# Patient Record
Sex: Female | Born: 1975 | Race: White | Hispanic: No | Marital: Married | Smoking: Former smoker
Health system: Southern US, Community
[De-identification: ages and names within clinical notes are randomized; demographics above are authoritative.]

## PROBLEM LIST (undated history)

## (undated) DIAGNOSIS — N9489 Other specified conditions associated with female genital organs and menstrual cycle: Secondary | ICD-10-CM

## (undated) DIAGNOSIS — K219 Gastro-esophageal reflux disease without esophagitis: Secondary | ICD-10-CM

## (undated) DIAGNOSIS — G43009 Migraine without aura, not intractable, without status migrainosus: Secondary | ICD-10-CM

## (undated) DIAGNOSIS — C50919 Malignant neoplasm of unspecified site of unspecified female breast: Secondary | ICD-10-CM

## (undated) DIAGNOSIS — Z9221 Personal history of antineoplastic chemotherapy: Secondary | ICD-10-CM

## (undated) DIAGNOSIS — N920 Excessive and frequent menstruation with regular cycle: Secondary | ICD-10-CM

## (undated) DIAGNOSIS — N189 Chronic kidney disease, unspecified: Secondary | ICD-10-CM

## (undated) DIAGNOSIS — Z923 Personal history of irradiation: Secondary | ICD-10-CM

## (undated) HISTORY — PX: BREAST BIOPSY: SHX20

## (undated) HISTORY — DX: Malignant neoplasm of unspecified site of unspecified female breast: C50.919

## (undated) SURGERY — MASTECTOMY WITH SENTINEL LYMPH NODE BIOPSY
Anesthesia: General | Site: Breast | Laterality: Bilateral

---

## 2008-11-07 HISTORY — PX: SALPINGECTOMY: SHX328

## 2009-05-07 HISTORY — PX: ECTOPIC PREGNANCY SURGERY: SHX613

## 2011-04-01 ENCOUNTER — Other Ambulatory Visit (HOSPITAL_COMMUNITY)
Admission: RE | Admit: 2011-04-01 | Discharge: 2011-04-01 | Disposition: A | Discharge status: Home / Self Care | Payer: Managed Care, Other (non HMO) | Source: Ambulatory Visit | Attending: Obstetrics and Gynecology | Admitting: Obstetrics and Gynecology

## 2011-04-01 ENCOUNTER — Other Ambulatory Visit: Payer: Self-pay | Admitting: Obstetrics and Gynecology

## 2011-04-01 DIAGNOSIS — N6311 Unspecified lump in the right breast, upper outer quadrant: Secondary | ICD-10-CM

## 2011-04-01 DIAGNOSIS — Z803 Family history of malignant neoplasm of breast: Secondary | ICD-10-CM

## 2011-04-01 DIAGNOSIS — Z01419 Encounter for gynecological examination (general) (routine) without abnormal findings: Secondary | ICD-10-CM | POA: Insufficient documentation

## 2011-08-12 ENCOUNTER — Other Ambulatory Visit: Payer: Self-pay | Admitting: Obstetrics and Gynecology

## 2011-08-12 DIAGNOSIS — N971 Female infertility of tubal origin: Secondary | ICD-10-CM

## 2011-08-22 ENCOUNTER — Ambulatory Visit (HOSPITAL_COMMUNITY)
Admission: RE | Admit: 2011-08-22 | Discharge: 2011-08-22 | Disposition: A | Discharge status: Home / Self Care | Payer: Managed Care, Other (non HMO) | Source: Ambulatory Visit | Attending: Obstetrics and Gynecology | Admitting: Obstetrics and Gynecology

## 2011-08-22 DIAGNOSIS — Z3049 Encounter for surveillance of other contraceptives: Secondary | ICD-10-CM | POA: Insufficient documentation

## 2011-08-22 DIAGNOSIS — N971 Female infertility of tubal origin: Secondary | ICD-10-CM

## 2011-08-22 LAB — PREGNANCY, URINE: Preg Test, Ur: NEGATIVE

## 2011-08-22 MED ORDER — IOHEXOL 300 MG/ML  SOLN: 5.0000 mL | Freq: Once | INTRAMUSCULAR | Status: AC | PRN

## 2014-03-03 ENCOUNTER — Other Ambulatory Visit (HOSPITAL_COMMUNITY)
Admission: RE | Admit: 2014-03-03 | Discharge: 2014-03-03 | Disposition: A | Discharge status: Home / Self Care | Payer: Managed Care, Other (non HMO) | Source: Ambulatory Visit | Attending: Obstetrics and Gynecology | Admitting: Obstetrics and Gynecology

## 2014-03-03 ENCOUNTER — Other Ambulatory Visit: Payer: Self-pay | Admitting: Obstetrics and Gynecology

## 2014-03-03 DIAGNOSIS — Z1151 Encounter for screening for human papillomavirus (HPV): Secondary | ICD-10-CM | POA: Insufficient documentation

## 2014-03-03 DIAGNOSIS — Z01419 Encounter for gynecological examination (general) (routine) without abnormal findings: Secondary | ICD-10-CM | POA: Insufficient documentation

## 2014-03-17 ENCOUNTER — Other Ambulatory Visit: Payer: Self-pay | Admitting: Radiology

## 2014-08-07 DIAGNOSIS — Z170 Estrogen receptor positive status [ER+]: Secondary | ICD-10-CM

## 2014-08-07 HISTORY — DX: Estrogen receptor positive status (ER+): Z17.0

## 2014-08-20 ENCOUNTER — Other Ambulatory Visit: Payer: Self-pay | Admitting: Radiology

## 2014-08-21 ENCOUNTER — Other Ambulatory Visit: Payer: Self-pay | Admitting: Radiology

## 2014-08-21 DIAGNOSIS — C50912 Malignant neoplasm of unspecified site of left female breast: Secondary | ICD-10-CM

## 2014-08-22 ENCOUNTER — Telehealth: Payer: Self-pay | Admitting: *Deleted

## 2014-08-22 DIAGNOSIS — C50412 Malignant neoplasm of upper-outer quadrant of left female breast: Secondary | ICD-10-CM | POA: Insufficient documentation

## 2014-08-22 NOTE — Telephone Encounter (Signed)
Confirmed [facility] for 08/27/14 at 0830.  Instructions and contact information given.

## 2014-08-25 ENCOUNTER — Ambulatory Visit: Payer: Managed Care, Other (non HMO) | Admitting: Psychology

## 2014-08-26 ENCOUNTER — Ambulatory Visit
Admission: RE | Admit: 2014-08-26 | Discharge: 2014-08-26 | Disposition: A | Discharge status: Home / Self Care | Payer: Managed Care, Other (non HMO) | Source: Ambulatory Visit | Attending: Radiology | Admitting: Radiology

## 2014-08-26 DIAGNOSIS — C50912 Malignant neoplasm of unspecified site of left female breast: Secondary | ICD-10-CM

## 2014-08-26 MED ORDER — GADOBENATE DIMEGLUMINE 529 MG/ML IV SOLN
12.0000 mL | Freq: Once | INTRAVENOUS | Status: AC | PRN
  Administered 2014-08-26: 12 mL via INTRAVENOUS

## 2014-08-27 ENCOUNTER — Encounter: Payer: Self-pay | Admitting: *Deleted

## 2014-08-27 ENCOUNTER — Encounter: Payer: Self-pay | Admitting: Hematology and Oncology

## 2014-08-27 ENCOUNTER — Other Ambulatory Visit: Payer: Managed Care, Other (non HMO)

## 2014-08-27 ENCOUNTER — Encounter: Payer: Self-pay | Admitting: Dietician

## 2014-08-27 ENCOUNTER — Ambulatory Visit (HOSPITAL_BASED_OUTPATIENT_CLINIC_OR_DEPARTMENT_OTHER): Payer: Managed Care, Other (non HMO) | Admitting: Hematology and Oncology

## 2014-08-27 ENCOUNTER — Telehealth: Payer: Self-pay | Admitting: Hematology and Oncology

## 2014-08-27 ENCOUNTER — Ambulatory Visit: Payer: Managed Care, Other (non HMO) | Attending: Surgery | Admitting: Physical Therapy

## 2014-08-27 ENCOUNTER — Ambulatory Visit
Admission: RE | Admit: 2014-08-27 | Discharge: 2014-08-27 | Disposition: A | Discharge status: Home / Self Care | Payer: Managed Care, Other (non HMO) | Source: Ambulatory Visit | Attending: Radiation Oncology | Admitting: Radiation Oncology

## 2014-08-27 ENCOUNTER — Ambulatory Visit: Payer: Managed Care, Other (non HMO) | Admitting: Psychology

## 2014-08-27 ENCOUNTER — Other Ambulatory Visit: Payer: Self-pay | Admitting: Radiology

## 2014-08-27 ENCOUNTER — Other Ambulatory Visit: Payer: Self-pay | Admitting: *Deleted

## 2014-08-27 ENCOUNTER — Ambulatory Visit: Payer: Managed Care, Other (non HMO)

## 2014-08-27 VITALS — BP 126/90 | HR 64 | Temp 97.8°F | Resp 18 | Ht 67.0 in | Wt 139.2 lb

## 2014-08-27 DIAGNOSIS — Z90721 Acquired absence of ovaries, unilateral: Secondary | ICD-10-CM | POA: Insufficient documentation

## 2014-08-27 DIAGNOSIS — C50919 Malignant neoplasm of unspecified site of unspecified female breast: Secondary | ICD-10-CM | POA: Diagnosis not present

## 2014-08-27 DIAGNOSIS — C50412 Malignant neoplasm of upper-outer quadrant of left female breast: Secondary | ICD-10-CM

## 2014-08-27 DIAGNOSIS — Z803 Family history of malignant neoplasm of breast: Secondary | ICD-10-CM

## 2014-08-27 DIAGNOSIS — Z5189 Encounter for other specified aftercare: Secondary | ICD-10-CM | POA: Insufficient documentation

## 2014-08-27 DIAGNOSIS — Z8049 Family history of malignant neoplasm of other genital organs: Secondary | ICD-10-CM

## 2014-08-27 DIAGNOSIS — Z170 Estrogen receptor positive status [ER+]: Secondary | ICD-10-CM

## 2014-08-27 NOTE — Progress Notes (Signed)
Patient was seen by RD during [facility] on 08/27/14.  Provided pt with folder of educational materials regarding general nutrition recommendations for breast cancer patients, plant-based diets, antioxidants, cancer facts vs myths, and information on organic foods  Explained importance of healthy nutrition during treatments and encouraged pt to consume daily recommended amount of fruits and vegetables, emphasizing variety of intake for maximum antioxidant and synergistic health benefits. Promoted adequate fiber intake, with use of whole grain and whole wheat products, beans, and lentils. Encouraged patient to follow a low fat diet with use of heart healthy fats, and to opt for plant-based proteins weekly  Recommended pt maintain healthy weight during treatments.  Diet recall indicated patient began following a paleo/whole foods diet about 1 year ago to become more healthy.  Eats increased amounts of fruits and vegetables.  Lost 15-20 lbs one year ago with diet changes.  Expect very good compliance  Provided pt with outpatient oncology RD contact information. Encouraged pt to contact RD with additional follow up questions or nutrition-related concerns.  Antonieta Iba, RD, LDN Clinical Inpatient Dietitian Pager:  [phone] Weekend and after hours pager:  [phone]

## 2014-08-27 NOTE — Progress Notes (Signed)
  Radiation Oncology         [phone]) [phone] ________________________________  Initial outpatient Consultation - Date: 08/27/2014   Name: Kim Armstrong MRN: [id number]   DOB: 1975-11-23  REFERRING PHYSICIAN: Erroll Luna, MD  DIAGNOSIS:    ICD-9-CM ICD-10-CM  1. Breast cancer of upper-outer quadrant of left female breast 174.4 C50.412    STAGE: Breast cancer of upper-outer quadrant of left female breast   Primary site: Breast (Left)   Staging method: AJCC 7th Edition   Clinical: Stage IIA (T2, N0, cM0)   Summary: Stage IIA (T2, N0, cM0)   Clinical comments: Staged at breast conference on 10.21.15   HISTORY OF PRESENT ILLNESS::Kim Armstrong is a 38 y.o. female  Who presented with a palpable left breast mass. 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 MRI of 1.9 x 2.5 x 2.6 cm mass in the left breast and a 8 mm nodule close to the mass (1 cm posterior to mass). She had a biopsy which whosed a Grade 3 IDC with ER+PR+HER2+ with a Ki 67 of 25% She is GxP1 with menarche at 41 and is still having periods. She is accompanied by her husband. Her mother had cancer at 34.   PREVIOUS RADIATION THERAPY: No  FAMILY HISTORY:  Family History  Problem Relation Age of Onset  . Breast cancer Mother     SOCIAL HISTORY:  History  Substance Use Topics  . Smoking status: Former Research scientist (life sciences)  . Smokeless tobacco: Not on file  . Alcohol Use: Yes    REVIEW OF SYSTEMS:  A 15 point review of systems is documented in the electronic medical record. This was obtained by the nursing staff. However, I reviewed this with the patient to discuss relevant findings and make appropriate changes.  Pertinent positives are included in the chart.   PHYSICAL EXAM:  Pleasant female in no distress. 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 Palpable mass in the retroareolar portion of the left breast with some bruising. No palpable adenopathy. Strength is 5.5  bilaterally. She is alert and oriented x 3.   IMPRESSION: Stage II Left Breast Cancer  PLAN: We discussed the role of radiation and decreasing local failures in patients who undergo lumpectomy. We discussed the retrospective data showing an increase in failure rates in patients who have a pathologic complete response and did not undergo radiation. For this reason I have recommended radiation to the whole breast followed by boost to the tumor bed. We discussed the process of simulation the placement tattoos. We discussed possible side effects during treatment including but not limited to skin irritation darkness and fatigue. We discussed long-term effects of treatment which are extremely unlikely but possible including damage to the lungs and ribs. We discussed the low likelihood of secondary malignancies. We discussed the use of breath hold technique for cardiac sparing.   She will receive neoadjuvant chemotherapy right now and we have scheduled her for genetic counseling due to her age and family history.   She has met with physical therapy and social work as well as our Health visitor.    I spent 60 minutes  face to face with the patient and more than 50% of that time was spent in counseling and/or coordination of care.   ------------------------------------------------  Thea Silversmith, MD

## 2014-08-27 NOTE — Assessment & Plan Note (Signed)
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 patient scheduled to undergo biopsy of a satellite lesion  Pathology counseling:Discussed with the patient, the details of pathology including the type of breast cancer,the clinical staging, the significance of ER, PR and HER-2/neu receptors and the implications for treatment. After reviewing the pathology in detail, we proceeded to discuss the different treatment options between surgery, radiation, chemotherapy, antiestrogen therapies.  Recommendation: Based on the multidisciplinary breast cancer conference, recommend neoadjuvant chemotherapy with Taxotere, carboplatin, Herceptin, Perjeta given once every 3 weeks for 6 cycles followed by Herceptin maintenance for one full year. After she undergoes surgery, if she gets a lumpectomy then she will need radiation therapy. 5 radiation and continue with Herceptin along with that we will initiate antiestrogen therapy with tamoxifen once daily for 10 years   Chemotherapy counseling: Discussed the risks and benefits of chemotherapy including the risk of hair loss, nausea vomiting, neuropathy, cytopenias, loss of taste and fatigue, risk of infection, as well as cardiac risk factors related to Herceptin and Perjeta. Patient will get Neulasta on day 2 of chemotherapy to prevent any infections. All of her questions regarding chemotherapy were answered. Patient understands it cannot be given either preoperatively or postoperatively. If she receives a preoperatively we may be a to render her pathologic complete response which could translate into significantly superior survival.  Plan: Patient will need to undergo port placement, echocardiogram, chemotherapy education class as well as genetics consultation. Our plan is to start chemotherapy within the next one to 2 weeks.

## 2014-08-27 NOTE — Progress Notes (Signed)
[facility] NOTE  Patient Care Team: Thurnell Lose, MD as PCP - General (Obstetrics and Gynecology) Thurnell Lose, MD (Obstetrics and Gynecology) Erroll Luna, MD as Consulting Physician (General Surgery) Rulon Eisenmenger, MD as Consulting Physician (Hematology and Oncology) Thea Silversmith, MD as Consulting Physician (Radiation Oncology) Erroll Luna, MD as Consulting Physician (General Surgery) Rulon Eisenmenger, MD as Consulting Physician (Hematology and Oncology) Thea Silversmith, MD as Consulting Physician (Radiation Oncology)  CHIEF COMPLAINTS/PURPOSE OF CONSULTATION:  Newly diagnosed breast cancer  HISTORY OF PRESENTING ILLNESS:  Kim Armstrong 38 y.o. female is here because of recent diagnosis of left breast cancer. She has a prior history of fibroadenoma involving the right breast and she is always had cystic changes in the breasts. Her breast always felt nodular and also tender to deep palpation. She underwent a mammogram that revealed abnormalities which led to a biopsy on 08/20/2014 that revealed that she had an invasive ductal carcinoma grade 3 that was ER/PR and HER-2 positive with a Ki-67 of 25%. She underwent an MRI of the breast which revealed a satellite nodule that is scheduled to be biopsied soon. In the same breast there was another mass was biopsy-proven to be fibroadenoma. On the left breast she has multiple very large cysts.  I reviewed her records extensively and collaborated the history with the patient.  SUMMARY OF ONCOLOGIC HISTORY:   Breast cancer of upper-outer quadrant of left female breast   08/20/2014 Initial Diagnosis Invasive ductal carcinoma grade 3, ER PR positive HER-2 positive ratio 3.5, Ki-67 25%   08/26/2014 Breast MRI Left breast: Upper-outer quadrant 1.4 cm from nipple 1.9 x 2.5 x 2.6 cm enhancing mass with the 8 mm nodule 1 cm posterior Right breast: 1.4 x 1.6 cm mass upper outer quadrant biopsy proven fibroadenoma:    In  terms of breast cancer risk profile:  She menarched at early age of 40 and still having periods She had one pregnancy, her first child was born at age 40 She was never exposed to fertility medications or hormone replacement therapy.  She has  family history of Breast/GYN/GI cancer Her mother was diagnosed with breast cancer age 47 MEDICAL HISTORY:  Past Medical History  Diagnosis Date  . Breast cancer     SURGICAL HISTORY: Past Surgical History  Procedure Laterality Date  . Salpingectomy  2010    SOCIAL HISTORY: History   Social History  . Marital Status: Married    Spouse Name: N/A    Number of Children: N/A  . Years of Education: N/A   Occupational History  . Not on file.   Social History Main Topics  . Smoking status: Former Research scientist (life sciences)  . Smokeless tobacco: Not on file  . Alcohol Use: Yes  . Drug Use: No  . Sexual Activity: Not on file   Other Topics Concern  . Not on file   Social History Narrative  . No narrative on file    FAMILY HISTORY: Family History  Problem Relation Age of Onset  . Breast cancer Mother     ALLERGIES:  has no allergies on file.  MEDICATIONS:  Current Outpatient Prescriptions  Medication Sig Dispense Refill  . ALPRAZolam (XANAX) 0.25 MG tablet Take 0.25 mg by mouth at bedtime as needed for anxiety.      Marland Kitchen isometheptene-acetaminophen-dichloralphenazone (MIDRIN) 65-325-100 MG capsule Take by mouth 4 (four) times daily as needed for migraine. Maximum 5 capsules in 12 hours for migraine headaches, 8 capsules in 24 hours for tension  headaches.      . metroNIDAZOLE (FLAGYL) 500 MG tablet Take 500 mg by mouth.      . tranexamic acid (LYSTEDA) 650 MG TABS tablet Take 1,300 mg by mouth 3 (three) times daily.       No current facility-administered medications for this visit.    REVIEW OF SYSTEMS:   Constitutional: Denies fevers, chills or abnormal night sweats Eyes: Denies blurriness of vision, double vision or watery eyes Ears, nose,  mouth, throat, and face: Denies mucositis or sore throat Respiratory: Denies cough, dyspnea or wheezes Cardiovascular: Denies palpitation, chest discomfort or lower extremity swelling Gastrointestinal:  Denies nausea, heartburn or change in bowel habits Skin: Denies abnormal skin rashes Lymphatics: Denies new lymphadenopathy or easy bruising Neurological:Denies numbness, tingling or new weaknesses Behavioral/Psych: Mood is stable, no new changes  Breast: Palpable mass in both breasts All other systems were reviewed with the patient and are negative.  PHYSICAL EXAMINATION: ECOG PERFORMANCE STATUS: 0 - Asymptomatic  Filed Vitals:   08/27/14 0901  BP: 126/90  Pulse: 64  Temp: 97.8 F (36.6 C)  Resp: 18   Filed Weights   08/27/14 0901  Weight: 139 lb 3.2 oz (63.141 kg)    GENERAL:alert, no distress and comfortable SKIN: skin color, texture, turgor are normal, no rashes or significant lesions EYES: normal, conjunctiva are pink and non-injected, sclera clear OROPHARYNX:no exudate, no erythema and lips, buccal mucosa, and tongue normal  NECK: supple, thyroid normal size, non-tender, without nodularity LYMPH:  no palpable lymphadenopathy in the cervical, axillary or inguinal LUNGS: clear to auscultation and percussion with normal breathing effort HEART: regular rate & rhythm and no murmurs and no lower extremity edema ABDOMEN:abdomen soft, non-tender and normal bowel sounds Musculoskeletal:no cyanosis of digits and no clubbing  PSYCH: alert & oriented x 3 with fluent speech NEURO: no focal motor/sensory deficits BREAST: Large palpable mass in both breasts many of which are cystic findings noted on the ultrasound. No palpable axillary or supraclavicular lymphadenopathy  LABORATORY DATA:  I have reviewed the data as listed No results found for this basename: WBC,  HGB,  HCT,  MCV,  PLT   No results found for this basename: NA,  K,  CL,  CO2    RADIOGRAPHIC STUDIES: I have  personally reviewed the radiological reports and agreed with the findings in the report.  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 patient scheduled to undergo biopsy of a satellite lesion  Pathology counseling:Discussed with the patient, the details of pathology including the type of breast cancer,the clinical staging, the significance of ER, PR and HER-2/neu receptors and the implications for treatment. After reviewing the pathology in detail, we proceeded to discuss the different treatment options between surgery, radiation, chemotherapy, antiestrogen therapies.  Recommendation: Based on the multidisciplinary breast cancer conference, recommend neoadjuvant chemotherapy with Taxotere, carboplatin, Herceptin, Perjeta given once every 3 weeks for 6 cycles followed by Herceptin maintenance for one full year. After she undergoes surgery, if she gets a lumpectomy then she will need radiation therapy. 5 radiation and continue with Herceptin along with that we will initiate antiestrogen therapy with tamoxifen once daily for 10 years   Chemotherapy counseling: Discussed the risks and benefits of chemotherapy including the risk of hair loss, nausea vomiting, neuropathy, cytopenias, loss of taste and fatigue, risk of infection, as well as cardiac risk factors  related to Herceptin and Perjeta. Patient will get Neulasta on day 2 of chemotherapy to prevent any infections. All of her questions regarding chemotherapy were answered. Patient understands it cannot be given either preoperatively or postoperatively. If she receives a preoperatively we may be a to render her pathologic complete response which could translate into significantly superior survival.  Plan: Patient will need to undergo port placement, echocardiogram, chemotherapy education class as  well as genetics consultation. Our plan is to start chemotherapy within the next one to 2 weeks.   All questions were answered. The patient knows to call the clinic with any problems, questions or concerns. I spent 40 minutes counseling the patient face to face. The total time spent in the appointment was 60 minutes and more than 50% was on counseling.     Rulon Eisenmenger, MD 08/27/2014 12:17 PM

## 2014-08-27 NOTE — Progress Notes (Signed)
Kim Armstrong Psychosocial Distress Screening  Clinical Social Work   Patient completed distress screening protocol and scored a 6 on the Psychosocial Distress Thermometer which indicates moderate distress. Clinical Social Worker met with patient in [facility] to assess for distress and other psychosocial needs. Patient stated her level of distress was slightly lower after meeting with the medical team and getting more information on her treatment plan. CSW and patient discussed the importance of support during treatment and common emotional reactions to being diagnosed with cancer. CSW informed patient of the support team and support services at [facility]. Patient expressed interest in counseling and was agreeable to a referral to the [facility] counseling interns. CSW offered additional support and encouraged patient to call with questions or concerns.   ONCBCN DISTRESS SCREENING 08/27/2014  Screening Type Initial Screening  Elta Gu[place] the number that describes how much distress you have been experiencing in the past week 6  Emotional problem type Adjusting to illness;Adjusting to appearance changes  Information Concerns Type Lack of info about diagnosis;Lack of info about treatment  Physician notified of physical symptoms Yes  Referral to clinical social work Yes  Referral to support programs Yes   Johnnye Lana, MSW, LCSW, OSW-C Clinical Social Worker [place] [phone]

## 2014-08-27 NOTE — Progress Notes (Signed)
Checked in new pt with no financial concerns at this time.  Informed pt if chemo is part of her treatment plan I will call [employer] to see if Kim Armstrong is req and will obtain that if it is as well as get in touch with different foundations that offer copay assistance for chemo if needed. Pt has my card for any questions or concerns.

## 2014-08-28 ENCOUNTER — Other Ambulatory Visit (HOSPITAL_BASED_OUTPATIENT_CLINIC_OR_DEPARTMENT_OTHER): Payer: Managed Care, Other (non HMO)

## 2014-08-28 ENCOUNTER — Encounter: Payer: Self-pay | Admitting: *Deleted

## 2014-08-28 ENCOUNTER — Ambulatory Visit (HOSPITAL_BASED_OUTPATIENT_CLINIC_OR_DEPARTMENT_OTHER): Payer: Managed Care, Other (non HMO) | Admitting: Genetic Counselor

## 2014-08-28 ENCOUNTER — Encounter: Payer: Self-pay | Admitting: Genetic Counselor

## 2014-08-28 DIAGNOSIS — C50412 Malignant neoplasm of upper-outer quadrant of left female breast: Secondary | ICD-10-CM

## 2014-08-28 DIAGNOSIS — Z315 Encounter for genetic counseling: Secondary | ICD-10-CM

## 2014-08-28 DIAGNOSIS — Z803 Family history of malignant neoplasm of breast: Secondary | ICD-10-CM

## 2014-08-28 LAB — COMPREHENSIVE METABOLIC PANEL (CC13)
ALT: 14 U/L (ref 0–55)
AST: 14 U/L (ref 5–34)
Albumin: 4.2 g/dL (ref 3.5–5.0)
Alkaline Phosphatase: 41 U/L (ref 40–150)
Anion Gap: 7 mEq/L (ref 3–11)
BUN: 9.3 mg/dL (ref 7.0–26.0)
CO2: 25 mEq/L (ref 22–29)
Calcium: 9.6 mg/dL (ref 8.4–10.4)
Chloride: 107 mEq/L (ref 98–109)
Creatinine: 0.9 mg/dL (ref 0.6–1.1)
Glucose: 91 mg/dl (ref 70–140)
Potassium: 4.1 mEq/L (ref 3.5–5.1)
Sodium: 139 mEq/L (ref 136–145)
Total Bilirubin: 0.29 mg/dL (ref 0.20–1.20)
Total Protein: 7 g/dL (ref 6.4–8.3)

## 2014-08-28 LAB — CBC WITH DIFFERENTIAL/PLATELET
BASO%: 0.6 % (ref 0.0–2.0)
Basophils Absolute: 0 10*3/uL (ref 0.0–0.1)
EOS%: 1.8 % (ref 0.0–7.0)
Eosinophils Absolute: 0.1 10*3/uL (ref 0.0–0.5)
HCT: 39.9 % (ref 34.8–46.6)
HGB: 13.1 g/dL (ref 11.6–15.9)
LYMPH%: 25.6 % (ref 14.0–49.7)
MCH: 30.9 pg (ref 25.1–34.0)
MCHC: 32.9 g/dL (ref 31.5–36.0)
MCV: 93.8 fL (ref 79.5–101.0)
MONO#: 0.5 10*3/uL (ref 0.1–0.9)
MONO%: 9.5 % (ref 0.0–14.0)
NEUT#: 3 10*3/uL (ref 1.5–6.5)
NEUT%: 62.5 % (ref 38.4–76.8)
Platelets: 242 10*3/uL (ref 145–400)
RBC: 4.25 10*6/uL (ref 3.70–5.45)
RDW: 12.6 % (ref 11.2–14.5)
WBC: 4.8 10*3/uL (ref 3.9–10.3)
lymph#: 1.2 10*3/uL (ref 0.9–3.3)

## 2014-08-28 NOTE — Progress Notes (Signed)
Met with pt after genetics appt. Gave appts for echo and chemo class. Discussed location of each appt. Pt denies further needs at this time. Gave pt contact information.

## 2014-08-28 NOTE — Progress Notes (Signed)
HISTORY OF PRESENT ILLNESS: Dr. Simona Huh requested a cancer genetics consultation for Kim Armstrong, a 38 y.o. female, due to a personal and family history of cancer.  Kim Armstrong presents to clinic today to discuss the possibility of a hereditary predisposition to cancer, genetic testing, and to further clarify her future cancer risks, as well as potential cancer risk for family members. Kim Armstrong was diagnosed with left breast IDC (triple positive)  at the age of 63. She is planning to undergo neoadjuvant therapy followed by surgery. The type of surgery and need for radiation therapy is to be determined.  She has no history of other cancer. Both ovaries remain intact.   Past Medical History  Diagnosis Date   Breast cancer     Past Surgical History  Procedure Laterality Date   Salpingectomy  2010   History   Social History   Marital Status: Married    Spouse Name: N/A    Number of Children: N/A   Years of Education: N/A   Social History Main Topics   Smoking status: Former Smoker   Smokeless tobacco: Not on file   Alcohol Use: Yes   Drug Use: No   Sexual Activity: Not on file   Other Topics Concern   Not on file   Social History Narrative   No narrative on file     FAMILY HISTORY:  During the visit, a 4-generation pedigree was obtained. Significant diagnoses include the following:  Family History  Problem Relation Age of Onset   Breast cancer Mother 98    unilateral   Cancer Maternal Aunt 37    unknown type of cancer   Cancer Maternal Grandmother 65    cancer - possibly pancreatic cancer or GI cancer    Kim Armstrong's ancestry is of [place] and [place] descent. There is no known Jewish ancestry or consanguinity.  GENETIC COUNSELING ASSESSMENT: Kim Armstrong is a 38 y.o. female with a personal and family history of cancer suggestive of a hereditary predisposition to cancer. We, therefore, discussed and recommended the following at today's  visit.   DISCUSSION: We reviewed the characteristics, features and inheritance patterns of hereditary cancer syndromes. We also discussed genetic testing, including the appropriate family members to test, the process of testing, insurance coverage and turn-around-time for results. We discussed the implications of a negative, positive and/or variant of uncertain significant result. We recommended Kim Armstrong pursue genetic testing for the [employer] gene panel.   PLAN: Based on our above recommendation, Kim Armstrong wished to pursue genetic testing and the blood sample was drawn and will be sent to [facility] for analysis. Results should be available within approximately 5 weeks time, at which point they will be disclosed by telephone to Kim Armstrong, as will any additional recommendations warranted by these results. We also encouraged Kim Armstrong to remain in contact with cancer genetics annually so that we can continuously update the family history and inform her of any changes in cancer genetics and testing that may be of benefit for this family. Ms.  Armstrong questions were answered to her satisfaction today. Our contact information was provided should additional questions or concerns arise.   Thank you for the referral and allowing [place] to share in the care of your patient.   The patient was seen for a total of 35 minutes in face-to-face genetic counseling.  This patient was discussed with Dr. Jana Hakim who agrees with the above.    _______________________________________________________________________ For Office Staff:  Number of people  involved in session: 2 Was an Intern/ student involved with case: not applicable

## 2014-08-29 ENCOUNTER — Ambulatory Visit (INDEPENDENT_AMBULATORY_CARE_PROVIDER_SITE_OTHER): Payer: Self-pay | Admitting: Surgery

## 2014-08-29 ENCOUNTER — Telehealth: Payer: Self-pay | Admitting: *Deleted

## 2014-08-29 NOTE — Progress Notes (Signed)
MD note created during office visit sent to scan.  Copy to patient.   

## 2014-08-29 NOTE — Telephone Encounter (Signed)
Called pt to clarify her needs for scheduling. Pt is scheduled for 2nd opinion with [facility] opinion 09/03/14. She will notify me on the team she chooses. If she chooses [facility], Dr. Josetta Huddle office will schedule port placement. Pt will wait for plastic referral until after she has genetic testing results. Denies further needs. Gave pt contact information.

## 2014-09-01 ENCOUNTER — Other Ambulatory Visit: Payer: Managed Care, Other (non HMO)

## 2014-09-01 ENCOUNTER — Ambulatory Visit (HOSPITAL_COMMUNITY)
Admission: RE | Admit: 2014-09-01 | Discharge: 2014-09-01 | Disposition: A | Discharge status: Home / Self Care | Payer: Managed Care, Other (non HMO) | Source: Ambulatory Visit | Attending: Hematology and Oncology | Admitting: Hematology and Oncology

## 2014-09-01 DIAGNOSIS — C50919 Malignant neoplasm of unspecified site of unspecified female breast: Secondary | ICD-10-CM

## 2014-09-01 DIAGNOSIS — C50412 Malignant neoplasm of upper-outer quadrant of left female breast: Secondary | ICD-10-CM

## 2014-09-01 NOTE — Progress Notes (Signed)
  Echocardiogram 2D Echocardiogram has been performed.  Kim Armstrong 09/01/2014, 10:05 AM

## 2014-09-03 ENCOUNTER — Encounter: Payer: Self-pay | Admitting: General Practice

## 2014-09-03 NOTE — Progress Notes (Signed)
Visited with Kim Armstrong in chemo class.  She used our personal visit time to share and process her family history of breast cancer. (Mom diagnosed at a similar age, but with more advanced case, and is a survivor.  Pt also pointed out that, at the time of her mother's dx, [name] herself was at an age similar to her own daughter's age now.  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  She is aware of ongoing chaplain availability, but please also page as needs arise:  [phone].  Thank you.  [place], [place]

## 2014-09-05 ENCOUNTER — Telehealth: Payer: Self-pay | Admitting: *Deleted

## 2014-09-05 NOTE — Telephone Encounter (Signed)
Left message for a return phone call to follow up.  Awaiting patient response.

## 2014-09-08 ENCOUNTER — Other Ambulatory Visit: Payer: Self-pay | Admitting: Hematology and Oncology

## 2014-09-08 ENCOUNTER — Encounter: Payer: Self-pay | Admitting: *Deleted

## 2014-09-08 ENCOUNTER — Telehealth: Payer: Self-pay | Admitting: Hematology and Oncology

## 2014-09-08 ENCOUNTER — Other Ambulatory Visit (INDEPENDENT_AMBULATORY_CARE_PROVIDER_SITE_OTHER): Payer: Self-pay

## 2014-09-08 ENCOUNTER — Telehealth: Payer: Self-pay | Admitting: *Deleted

## 2014-09-08 DIAGNOSIS — C50912 Malignant neoplasm of unspecified site of left female breast: Secondary | ICD-10-CM

## 2014-09-08 NOTE — Telephone Encounter (Signed)
LM to confirm Chemo edu class r/s to 09/17/14.

## 2014-09-08 NOTE — Progress Notes (Signed)
Pt called to state she has decided to receive treatment at [facility]. Physician team notified.

## 2014-09-08 NOTE — Telephone Encounter (Signed)
Per staff message from navigator I have scheduled apptt.. JMW

## 2014-09-10 ENCOUNTER — Other Ambulatory Visit: Payer: Self-pay | Admitting: Hematology and Oncology

## 2014-09-10 DIAGNOSIS — C50412 Malignant neoplasm of upper-outer quadrant of left female breast: Secondary | ICD-10-CM

## 2014-09-10 MED ORDER — LIDOCAINE-PRILOCAINE 2.5-2.5 % EX CREA: TOPICAL_CREAM | CUTANEOUS | Status: DC

## 2014-09-10 MED ORDER — LORAZEPAM 0.5 MG PO TABS: 0.5000 mg | ORAL_TABLET | Freq: Four times a day (QID) | ORAL | Status: DC | PRN

## 2014-09-10 MED ORDER — PROCHLORPERAZINE MALEATE 10 MG PO TABS: 10.0000 mg | ORAL_TABLET | Freq: Four times a day (QID) | ORAL | Status: DC | PRN

## 2014-09-10 MED ORDER — ONDANSETRON HCL 8 MG PO TABS: 8.0000 mg | ORAL_TABLET | Freq: Two times a day (BID) | ORAL | Status: DC

## 2014-09-10 MED ORDER — DEXAMETHASONE 4 MG PO TABS: 8.0000 mg | ORAL_TABLET | Freq: Two times a day (BID) | ORAL | Status: DC

## 2014-09-11 ENCOUNTER — Other Ambulatory Visit: Payer: Self-pay | Admitting: Radiology

## 2014-09-11 ENCOUNTER — Telehealth: Payer: Self-pay

## 2014-09-11 NOTE — Telephone Encounter (Signed)
mamogram results rcvd from [facility] 08/27/14 Dr Isaiah Blakes.  Copy to Dr Lindi Adie.  Original to scan.

## 2014-09-12 ENCOUNTER — Ambulatory Visit (HOSPITAL_COMMUNITY)
Admission: RE | Admit: 2014-09-12 | Discharge: 2014-09-12 | Disposition: A | Discharge status: Home / Self Care | Payer: Managed Care, Other (non HMO) | Source: Ambulatory Visit | Attending: Surgery | Admitting: Surgery

## 2014-09-12 ENCOUNTER — Other Ambulatory Visit (INDEPENDENT_AMBULATORY_CARE_PROVIDER_SITE_OTHER): Payer: Self-pay | Admitting: Surgery

## 2014-09-12 ENCOUNTER — Encounter (HOSPITAL_COMMUNITY): Payer: Self-pay

## 2014-09-12 DIAGNOSIS — Z87891 Personal history of nicotine dependence: Secondary | ICD-10-CM | POA: Insufficient documentation

## 2014-09-12 DIAGNOSIS — C50912 Malignant neoplasm of unspecified site of left female breast: Secondary | ICD-10-CM | POA: Diagnosis present

## 2014-09-12 DIAGNOSIS — Z7952 Long term (current) use of systemic steroids: Secondary | ICD-10-CM | POA: Diagnosis not present

## 2014-09-12 LAB — CBC WITH DIFFERENTIAL/PLATELET
Basophils Absolute: 0 10*3/uL (ref 0.0–0.1)
Basophils Relative: 0 % (ref 0–1)
Eosinophils Absolute: 0.1 10*3/uL (ref 0.0–0.7)
Eosinophils Relative: 4 % (ref 0–5)
HCT: 39.3 % (ref 36.0–46.0)
Hemoglobin: 13.3 g/dL (ref 12.0–15.0)
Lymphocytes Relative: 31 % (ref 12–46)
Lymphs Abs: 1 10*3/uL (ref 0.7–4.0)
MCH: 30.9 pg (ref 26.0–34.0)
MCHC: 33.8 g/dL (ref 30.0–36.0)
MCV: 91.4 fL (ref 78.0–100.0)
Monocytes Absolute: 0.3 10*3/uL (ref 0.1–1.0)
Monocytes Relative: 10 % (ref 3–12)
Neutro Abs: 1.9 10*3/uL (ref 1.7–7.7)
Neutrophils Relative %: 55 % (ref 43–77)
Platelets: 215 10*3/uL (ref 150–400)
RBC: 4.3 MIL/uL (ref 3.87–5.11)
RDW: 12.3 % (ref 11.5–15.5)
WBC: 3.4 10*3/uL — ABNORMAL LOW (ref 4.0–10.5)

## 2014-09-12 LAB — PROTIME-INR
INR: 0.97 (ref 0.00–1.49)
Prothrombin Time: 13 seconds (ref 11.6–15.2)

## 2014-09-12 LAB — HCG, SERUM, QUALITATIVE: Preg, Serum: NEGATIVE

## 2014-09-12 LAB — APTT: aPTT: 29 seconds (ref 24–37)

## 2014-09-12 MED ORDER — FENTANYL CITRATE 0.05 MG/ML IJ SOLN
INTRAMUSCULAR | Status: AC
Start: 2014-09-12 — End: 2014-09-13
  Filled 2014-09-12: qty 2

## 2014-09-12 MED ORDER — HEPARIN SOD (PORK) LOCK FLUSH 100 UNIT/ML IV SOLN
INTRAVENOUS | Status: AC
Start: 2014-09-12 — End: 2014-09-13
  Filled 2014-09-12: qty 5

## 2014-09-12 MED ORDER — SODIUM CHLORIDE 0.9 % IV SOLN
INTRAVENOUS | Status: DC
Start: 2014-09-12 — End: 2014-09-13
  Administered 2014-09-12: 11:00:00 via INTRAVENOUS

## 2014-09-12 MED ORDER — MIDAZOLAM HCL 2 MG/2ML IJ SOLN
INTRAMUSCULAR | Status: AC
Start: 2014-09-12 — End: 2014-09-13
  Filled 2014-09-12: qty 2

## 2014-09-12 MED ORDER — CEFAZOLIN SODIUM-DEXTROSE 2-3 GM-% IV SOLR
INTRAVENOUS | Status: AC
Start: 2014-09-12 — End: 2014-09-13
  Filled 2014-09-12: qty 50

## 2014-09-12 MED ORDER — SODIUM CHLORIDE 0.9 % IR SOLN
Status: AC | PRN
  Administered 2014-09-12: 14:00:00

## 2014-09-12 MED ORDER — LIDOCAINE HCL 1 % IJ SOLN
INTRAMUSCULAR | Status: AC
Start: 2014-09-12 — End: 2014-09-13
  Filled 2014-09-12: qty 20

## 2014-09-12 MED ORDER — CEFAZOLIN SODIUM-DEXTROSE 2-3 GM-% IV SOLR
2.0000 g | INTRAVENOUS | Status: AC
  Administered 2014-09-12: 2 g via INTRAVENOUS

## 2014-09-12 NOTE — H&P (Signed)
Chief Complaint: "I'm getting a port a cath"  Referring Physician(s): Cornett,Thomas/ Dr. Lindi Adie  History of Present Illness: Kim Armstrong is a 38 y.o. 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 She presents today for port a cath placement for chemotherapy.   Past Medical History  Diagnosis Date  . Breast cancer     Past Surgical History  Procedure Laterality Date  . Salpingectomy  2010  . Breast biopsy Left 10/14 and 21 of 2015    Allergies: Review of patient's allergies indicates no known allergies.  Medications: Prior to Admission medications   Medication Sig Start Date End Date Taking? Authorizing Provider  acetaminophen (TYLENOL) 500 MG tablet Take 1,000 mg by mouth every 6 (six) hours as needed for mild pain or moderate pain.   Yes Historical Provider, MD  isometheptene-acetaminophen-dichloralphenazone (MIDRIN) 65-325-100 MG capsule Take 1-2 capsules by mouth 4 (four) times daily as needed for migraine.    Yes Historical Provider, MD  tranexamic acid (LYSTEDA) 650 MG TABS tablet Take 1,300 mg by mouth 3 (three) times daily as needed (heavy menstrual cycle).    Yes Historical Provider, MD  ALPRAZolam Duanne Moron) 0.25 MG tablet Take 0.25 mg by mouth at bedtime as needed for anxiety (and for flying).     Historical Provider, MD  dexamethasone (DECADRON) 4 MG tablet Take 2 tablets (8 mg total) by mouth 2 (two) times daily. Start the day before Taxotere. Then again the day after chemo for 3 days. 09/10/14   Rulon Eisenmenger, MD  lidocaine-prilocaine (EMLA) cream Apply to affected area once 09/10/14   Rulon Eisenmenger, MD  lidocaine-prilocaine (EMLA) cream Apply to affected area once 09/10/14   Rulon Eisenmenger, MD  LORazepam (ATIVAN) 0.5 MG tablet Take 1 tablet (0.5 mg total) by mouth every 6 (six) hours as needed (Nausea or vomiting). 09/10/14   Rulon Eisenmenger, MD  ondansetron (ZOFRAN) 8 MG tablet Take 1 tablet (8 mg total) by mouth 2 (two) times daily.  Start the day after chemo for 3 days. Then take as needed for nausea or vomiting. 09/10/14   Rulon Eisenmenger, MD  prochlorperazine (COMPAZINE) 10 MG tablet Take 1 tablet (10 mg total) by mouth every 6 (six) hours as needed (Nausea or vomiting). 09/10/14   Rulon Eisenmenger, MD    Family History  Problem Relation Age of Onset  . Breast cancer Mother 74    unilateral  . Cancer Maternal Aunt 65    unknown type of cancer  . Cancer Maternal Grandmother 54    cancer - possibly pancreatic cancer or GI cancer    History   Social History  . Marital Status: Married    Spouse Name: N/A    Number of Children: N/A  . Years of Education: N/A   Social History Main Topics  . Smoking status: Former Research scientist (life sciences)  . Smokeless tobacco: None  . Alcohol Use: Yes  . Drug Use: No  . Sexual Activity: None   Other Topics Concern  . None   Social History Narrative        Review of Systems  Constitutional: Negative for fever and chills.  Respiratory: Negative for cough and shortness of breath.   Cardiovascular: Negative for chest pain.  Gastrointestinal: Negative for nausea, vomiting, abdominal pain and blood in stool.  Genitourinary: Negative for dysuria and hematuria.  Musculoskeletal: Negative for back pain.  Neurological: Negative for headaches.  Hematological: Does not bruise/bleed easily.    Vital  Signs: BP 119/89 mmHg  Pulse 69  Temp(Src) 98.3 F (36.8 C) (Oral)  Resp 16  Ht 5\' 7"  (1.702 m)  Wt 139 lb (63.05 kg)  BMI 21.77 kg/m2  SpO2 100%  LMP 08/29/2014  Physical Exam  Constitutional: She is oriented to person, place, and time. She appears well-developed and well-nourished.  Cardiovascular: Normal rate and regular rhythm.   Pulmonary/Chest: Effort normal and breath sounds normal.  Abdominal: Soft. Bowel sounds are normal. There is no tenderness.  Musculoskeletal: Normal range of motion. She exhibits no edema.  Neurological: She is alert and oriented to person, place, and time.     Imaging: Mr Breast Bilateral W Wo Contrast  08/26/2014   CLINICAL DATA:  New diagnosis of left invasive ductal carcinoma in the 1 o'clock location of the left breast following ultrasound-guided core biopsy on 08/20/2014. 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 Family history of breast cancer diagnosed in her mother at age 42.  LABS:  Not applicable  EXAM: BILATERAL BREAST MRI WITH AND WITHOUT CONTRAST  TECHNIQUE: Multiplanar, multisequence MR images of both breasts were obtained prior to and following the intravenous administration of 43ml of MultiHance.  THREE-DIMENSIONAL MR IMAGE RENDERING ON INDEPENDENT WORKSTATION:  Three-dimensional MR images were rendered by post-processing of the original MR data on an independent workstation. The three-dimensional MR images were interpreted, and findings are reported in the following complete MRI report for this study. Three dimensional images were evaluated at the independent DynaCad workstation  COMPARISON:  Ultrasound and mammogram for performed at [facility] on 08/20/2014 and earlier  FINDINGS: Breast composition: d.  Extreme fibroglandular tissue.  Background parenchymal enhancement: Moderate  Right breast: 1.4 x 1.6 cm mass within the upper-outer quadrant of the right breast. This mass contains signal void following previous biopsy showing benign fibroadenoma. No suspicious enhancement is identified in the right breast. There are scattered benign cysts.  Left breast: Within the upper-outer quadrant of the left breast approximately 1.4 cm from the nipple there is an enhancing mass which measures 1.9 x 2.5 x 2.6 cm. 1 cm posterior to this mass there is an 8 mm nodule. These nodules demonstrate similar enhancement kinetics, showing rapid wash-in and washout type characteristics. Within the larger anterior mass there is a tissue marker clip following recent ultrasound-guided core biopsy which showed malignancy.  Lymph nodes: No  abnormal appearing lymph nodes.  Ancillary findings:  None.  IMPRESSION: 1. Upper-outer quadrant mass in the left breast measures 2.5 cm 2.6 cm and is consistent with known malignancy. 2. Small satellite mass 1 cm posterior to the known malignancy also suspicious for malignancy measuring 8 mm. 3. No evidence for adenopathy. 4. Benign right breast fibroadenoma previously biopsied.  RECOMMENDATION: Treatment plan  BI-RADS CATEGORY  6: Known biopsy-proven malignancy.   Electronically Signed   By: Shon Hale M.D.   On: 08/26/2014 14:24    Labs:  CBC:  Recent Labs  08/28/14 1322 09/12/14 1055  WBC 4.8 3.4*  HGB 13.1 13.3  HCT 39.9 39.3  PLT 242 215    COAGS:  Recent Labs  09/12/14 1055  INR 0.97  APTT 29    BMP:  Recent Labs  08/28/14 1323  NA 139  K 4.1  CO2 25  GLUCOSE 91  BUN 9.3  CALCIUM 9.6  CREATININE 0.9    LIVER FUNCTION TESTS:  Recent Labs  08/28/14 1323  BILITOT 0.29  AST 14  ALT 14  ALKPHOS 41  PROT 7.0  ALBUMIN 4.2    TUMOR MARKERS: No results for input(s): AFPTM, CEA, CA199, CHROMGRNA in the last 8760 hours.  Assessment and Plan: Kim Armstrong is a 38 y.o. 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 She presents today for port a cath placement for chemotherapy. Details/risks of procedure d/w pt/husband with their understanding and consent.            Signed: Autumn Messing 09/12/2014, 11:45 AM

## 2014-09-12 NOTE — Discharge Instructions (Signed)
Implanted [facility] Guide An implanted port is a type of central line that is placed under the skin. Central lines are used to provide IV access when treatment or nutrition needs to be given through a person's veins. Implanted ports are used for long-term IV access. An implanted port may be placed because:   You need IV medicine that would be irritating to the small veins in your hands or arms.   You need long-term IV medicines, such as antibiotics.   You need IV nutrition for a long period.   You need frequent blood draws for lab tests.   You need dialysis.  Implanted ports are usually placed in the chest area, but they can also be placed in the upper arm, the abdomen, or the leg. An implanted port has two main parts:   Reservoir. The reservoir is round and will appear as a small, raised area under your skin. The reservoir is the part where a needle is inserted to give medicines or draw blood.   Catheter. The catheter is a thin, flexible tube that extends from the reservoir. The catheter is placed into a large vein. Medicine that is inserted into the reservoir goes into the catheter and then into the vein.  HOW WILL I CARE FOR MY INCISION SITE? Do not get the incision site wet. Bathe or shower as directed by your health care provider.  HOW IS MY PORT ACCESSED? Special steps must be taken to access the port:   Before the port is accessed, a numbing cream can be placed on the skin. This helps numb the skin over the port site.   Your health care provider uses a sterile technique to access the port.  Your health care provider must put on a mask and sterile gloves.  The skin over your port is cleaned carefully with an antiseptic and allowed to dry.  The port is gently pinched between sterile gloves, and a needle is inserted into the port.  Only "non-coring" port needles should be used to access the port. Once the port is accessed, a blood return should be checked. This helps  ensure that the port is in the vein and is not clogged.   If your port needs to remain accessed for a constant infusion, a clear (transparent) bandage will be placed over the needle site. The bandage and needle will need to be changed every week, or as directed by your health care provider.   Keep the bandage covering the needle clean and dry. Do not get it wet. Follow your health care provider's instructions on how to take a shower or bath while the port is accessed. REMOVE DRESSING IN AM AND MAY SHOWER  If your port does not need to stay accessed, no bandage is needed over the port.  WHAT IS FLUSHING? Flushing helps keep the port from getting clogged. Follow your health care provider's instructions on how and when to flush the port. Ports are usually flushed with saline solution or a medicine called heparin. The need for flushing will depend on how the port is used.   If the port is used for intermittent medicines or blood draws, the port will need to be flushed:   After medicines have been given.   After blood has been drawn.   As part of routine maintenance.   If a constant infusion is running, the port may not need to be flushed.  HOW LONG WILL MY PORT STAY IMPLANTED? The port can stay in for  as long as your health care provider thinks it is needed. When it is time for the port to come out, surgery will be done to remove it. The procedure is similar to the one performed when the port was put in.  WHEN SHOULD I SEEK IMMEDIATE MEDICAL CARE? When you have an implanted port, you should seek immediate medical care if:   You notice a bad smell coming from the incision site.   You have swelling, redness, or drainage at the incision site.   You have more swelling or pain at the port site or the surrounding area.   You have a fever that is not controlled with medicine. Document Released: 10/24/2005 Document Revised: 08/14/2013 Document Reviewed: 07/01/2013 [facility] Patient  Information 2015 [place], [place]. This information is not intended to replace advice given to you by your health care provider. Make sure you discuss any questions you have with your health care provider. Conscious Sedation, Adult, Care After Refer to this sheet in the next few weeks. These instructions provide you with information on caring for yourself after your procedure. Your health care provider may also give you more specific instructions. Your treatment has been planned according to current medical practices, but problems sometimes occur. Call your health care provider if you have any problems or questions after your procedure. WHAT TO EXPECT AFTER THE PROCEDURE  After your procedure:  You may feel sleepy, clumsy, and have poor balance for several hours.  Vomiting may occur if you eat too soon after the procedure. HOME CARE INSTRUCTIONS  Do not participate in any activities where you could become injured for at least 24 hours. Do not:  Drive.  Swim.  Ride a bicycle.  Operate heavy machinery.  Cook.  Use power tools.  Climb ladders.  Work from a high place.  Do not make important decisions or sign legal documents until you are improved.  If you vomit, drink water, juice, or soup when you can drink without vomiting. Make sure you have little or no nausea before eating solid foods.  Only take over-the-counter or prescription medicines for pain, discomfort, or fever as directed by your health care provider.  Make sure you and your family fully understand everything about the medicines given to you, including what side effects may occur.  You should not drink alcohol, take sleeping pills, or take medicines that cause drowsiness for at least 24 hours.  If you smoke, do not smoke without supervision.  If you are feeling better, you may resume normal activities 24 hours after you were sedated.  Keep all appointments with your health care provider. SEEK MEDICAL CARE  IF:  Your skin is pale or bluish in color.  You continue to feel nauseous or vomit.  Your pain is getting worse and is not helped by medicine.  You have bleeding or swelling.  You are still sleepy or feeling clumsy after 24 hours. SEEK IMMEDIATE MEDICAL CARE IF:  You develop a rash.  You have difficulty breathing.  You develop any type of allergic problem.  You have a fever. MAKE SURE YOU:  Understand these instructions.  Will watch your condition.  Will get help right away if you are not doing well or get worse. Document Released: 08/14/2013 Document Reviewed: 08/14/2013 [facility] Patient Information 2015 [place], [place]. This information is not intended to replace advice given to you by your health care provider. Make sure you discuss any questions you have with your health care provider.   :

## 2014-09-16 ENCOUNTER — Telehealth: Payer: Self-pay | Admitting: *Deleted

## 2014-09-16 NOTE — Telephone Encounter (Signed)
Called pt to check in and confirm f/u appt with Dr. Lindi Adie. Pt relate port placement went well. Scheduled and confirmed neulasta injection for 09/20/14/ at 1115. Pt denies further needs at this time.

## 2014-09-17 ENCOUNTER — Telehealth: Payer: Self-pay | Admitting: Hematology and Oncology

## 2014-09-17 ENCOUNTER — Other Ambulatory Visit: Payer: Managed Care, Other (non HMO)

## 2014-09-17 ENCOUNTER — Ambulatory Visit (HOSPITAL_BASED_OUTPATIENT_CLINIC_OR_DEPARTMENT_OTHER): Payer: Managed Care, Other (non HMO) | Admitting: Hematology and Oncology

## 2014-09-17 ENCOUNTER — Other Ambulatory Visit (HOSPITAL_BASED_OUTPATIENT_CLINIC_OR_DEPARTMENT_OTHER): Payer: Managed Care, Other (non HMO)

## 2014-09-17 ENCOUNTER — Other Ambulatory Visit: Payer: Self-pay | Admitting: *Deleted

## 2014-09-17 VITALS — BP 126/98 | HR 75 | Temp 97.9°F | Resp 18 | Ht 67.0 in | Wt 137.4 lb

## 2014-09-17 DIAGNOSIS — C50412 Malignant neoplasm of upper-outer quadrant of left female breast: Secondary | ICD-10-CM

## 2014-09-17 DIAGNOSIS — Z170 Estrogen receptor positive status [ER+]: Secondary | ICD-10-CM

## 2014-09-17 LAB — COMPREHENSIVE METABOLIC PANEL (CC13)
ALT: 10 U/L (ref 0–55)
AST: 13 U/L (ref 5–34)
Albumin: 4.1 g/dL (ref 3.5–5.0)
Alkaline Phosphatase: 42 U/L (ref 40–150)
Anion Gap: 5 mEq/L (ref 3–11)
BUN: 8.1 mg/dL (ref 7.0–26.0)
CO2: 26 mEq/L (ref 22–29)
Calcium: 9.3 mg/dL (ref 8.4–10.4)
Chloride: 108 mEq/L (ref 98–109)
Creatinine: 0.8 mg/dL (ref 0.6–1.1)
Glucose: 88 mg/dl (ref 70–140)
Potassium: 3.9 mEq/L (ref 3.5–5.1)
Sodium: 139 mEq/L (ref 136–145)
Total Bilirubin: 0.44 mg/dL (ref 0.20–1.20)
Total Protein: 6.8 g/dL (ref 6.4–8.3)

## 2014-09-17 LAB — CBC WITH DIFFERENTIAL/PLATELET
BASO%: 1 % (ref 0.0–2.0)
Basophils Absolute: 0 10*3/uL (ref 0.0–0.1)
EOS%: 4.4 % (ref 0.0–7.0)
Eosinophils Absolute: 0.1 10*3/uL (ref 0.0–0.5)
HCT: 37.4 % (ref 34.8–46.6)
HGB: 12.5 g/dL (ref 11.6–15.9)
LYMPH%: 30.6 % (ref 14.0–49.7)
MCH: 31.4 pg (ref 25.1–34.0)
MCHC: 33.5 g/dL (ref 31.5–36.0)
MCV: 93.6 fL (ref 79.5–101.0)
MONO#: 0.3 10*3/uL (ref 0.1–0.9)
MONO%: 10.4 % (ref 0.0–14.0)
NEUT#: 1.7 10*3/uL (ref 1.5–6.5)
NEUT%: 53.6 % (ref 38.4–76.8)
Platelets: 195 10*3/uL (ref 145–400)
RBC: 3.99 10*6/uL (ref 3.70–5.45)
RDW: 12.4 % (ref 11.2–14.5)
WBC: 3.2 10*3/uL — ABNORMAL LOW (ref 3.9–10.3)
lymph#: 1 10*3/uL (ref 0.9–3.3)

## 2014-09-17 MED ORDER — PROCHLORPERAZINE MALEATE 10 MG PO TABS: 10.0000 mg | ORAL_TABLET | Freq: Four times a day (QID) | ORAL | Status: DC | PRN

## 2014-09-17 MED ORDER — LORAZEPAM 0.5 MG PO TABS: 0.5000 mg | ORAL_TABLET | Freq: Four times a day (QID) | ORAL | Status: DC | PRN

## 2014-09-17 MED ORDER — ONDANSETRON HCL 8 MG PO TABS: 8.0000 mg | ORAL_TABLET | Freq: Two times a day (BID) | ORAL | Status: DC

## 2014-09-17 MED ORDER — DEXAMETHASONE 4 MG PO TABS: 8.0000 mg | ORAL_TABLET | Freq: Two times a day (BID) | ORAL | Status: DC

## 2014-09-17 MED ORDER — LIDOCAINE-PRILOCAINE 2.5-2.5 % EX CREA: TOPICAL_CREAM | CUTANEOUS | Status: DC

## 2014-09-17 NOTE — Progress Notes (Signed)
Patient Care Team: Thurnell Lose, MD as PCP - General (Obstetrics and Gynecology) Thurnell Lose, MD (Obstetrics and Gynecology) Erroll Luna, MD as Consulting Physician (General Surgery) Rulon Eisenmenger, MD as Consulting Physician (Hematology and Oncology) Thea Silversmith, MD as Consulting Physician (Radiation Oncology) Erroll Luna, MD as Consulting Physician (General Surgery) Rulon Eisenmenger, MD as Consulting Physician (Hematology and Oncology) Thea Silversmith, MD as Consulting Physician (Radiation Oncology)  DIAGNOSIS: Breast cancer of upper-outer quadrant of left female breast   Staging form: Breast, AJCC 7th Edition     Clinical: Stage IIA (T2, N0, cM0) - Unsigned       Staging comments: Staged at breast conference on 10.21.15      Pathologic: No stage assigned - Unsigned   SUMMARY OF ONCOLOGIC HISTORY:   Breast cancer of upper-outer quadrant of left female breast   08/20/2014 Initial Diagnosis Invasive ductal carcinoma grade 3, ER PR positive HER-2 positive ratio 3.5, Ki-67 25%; biopsy of satellite lesion fibroadenoma   08/26/2014 Breast MRI Left breast: Upper-outer quadrant 1.4 cm from nipple 1.9 x 2.5 x 2.6 cm enhancing mass with the 8 mm nodule 1 cm posterior Right breast: 1.4 x 1.6 cm mass upper outer quadrant biopsy proven fibroadenoma:    CHIEF COMPLIANT: patient here to sign consent for chemotherapy  INTERVAL HISTORY: Kim Armstrong is a 39 year old Caucasian with above-mentioned history of invasive ductal carcinoma that was ER/PR and HER-2 positive. We counseled her about neoadjuvant chemotherapy with Taxotere, carboplatin, Herceptin and Perjeta. She is starting chemotherapy 09/19/2014. She had a port placement which is healed we will. Her echocardiogram was done which showed an EF of 55%. She underwent chemotherapy counseling class as well. She is here to discuss the final plan.  REVIEW OF SYSTEMS:   Constitutional: Denies fevers, chills or abnormal weight  loss Eyes: Denies blurriness of vision Ears, nose, mouth, throat, and face: Denies mucositis or sore throat Respiratory: Denies cough, dyspnea or wheezes Cardiovascular: Denies palpitation, chest discomfort or lower extremity swelling Gastrointestinal:  Denies nausea, heartburn or change in bowel habits Skin: Denies abnormal skin rashes Lymphatics: Denies new lymphadenopathy or easy bruising Neurological:Denies numbness, tingling or new weaknesses Behavioral/Psych: Mood is stable, no new changes  Breast:  denies any pain or lumps or nodules in either breasts All other systems were reviewed with the patient and are negative.  I have reviewed the past medical history, past surgical history, social history and family history with the patient and they are unchanged from previous note.  ALLERGIES:  has No Known Allergies.  MEDICATIONS:  Current Outpatient Prescriptions  Medication Sig Dispense Refill  . acetaminophen (TYLENOL) 500 MG tablet Take 1,000 mg by mouth every 6 (six) hours as needed for mild pain or moderate pain.    Marland Kitchen ALPRAZolam (XANAX) 0.25 MG tablet Take 0.25 mg by mouth at bedtime as needed for anxiety (and for flying).     Marland Kitchen dexamethasone (DECADRON) 4 MG tablet Take 2 tablets (8 mg total) by mouth 2 (two) times daily. Start the day before Taxotere. Then again the day after chemo for 3 days. 30 tablet 1  . isometheptene-acetaminophen-dichloralphenazone (MIDRIN) 65-325-100 MG capsule Take 1-2 capsules by mouth 4 (four) times daily as needed for migraine.     . lidocaine-prilocaine (EMLA) cream Apply to affected area once 30 g 3  . lidocaine-prilocaine (EMLA) cream Apply to affected area once 30 g 3  . LORazepam (ATIVAN) 0.5 MG tablet Take 1 tablet (0.5 mg total) by mouth every 6 (six) hours as  needed (Nausea or vomiting). 30 tablet 0  . ondansetron (ZOFRAN) 8 MG tablet Take 1 tablet (8 mg total) by mouth 2 (two) times daily. Start the day after chemo for 3 days. Then take as needed  for nausea or vomiting. 30 tablet 1  . prochlorperazine (COMPAZINE) 10 MG tablet Take 1 tablet (10 mg total) by mouth every 6 (six) hours as needed (Nausea or vomiting). 30 tablet 1  . tranexamic acid (LYSTEDA) 650 MG TABS tablet Take 1,300 mg by mouth 3 (three) times daily as needed (heavy menstrual cycle).      No current facility-administered medications for this visit.    PHYSICAL EXAMINATION: ECOG PERFORMANCE STATUS: 0 - Asymptomatic  Filed Vitals:   09/17/14 0800  BP: 126/98  Pulse: 75  Temp: 97.9 F (36.6 C)  Resp: 18   Filed Weights   09/17/14 0800  Weight: 137 lb 6.4 oz (62.324 kg)    GENERAL:alert, no distress and comfortable SKIN: skin color, texture, turgor are normal, no rashes or significant lesions EYES: normal, Conjunctiva are pink and non-injected, sclera clear OROPHARYNX:no exudate, no erythema and lips, buccal mucosa, and tongue normal  NECK: supple, thyroid normal size, non-tender, without nodularity LYMPH:  no palpable lymphadenopathy in the cervical, axillary or inguinal LUNGS: clear to auscultation and percussion with normal breathing effort HEART: regular rate & rhythm and no murmurs and no lower extremity edema ABDOMEN:abdomen soft, non-tender and normal bowel sounds Musculoskeletal:no cyanosis of digits and no clubbing  NEURO: alert & oriented x 3 with fluent speech, no focal motor/sensory deficits  LABORATORY DATA:  I have reviewed the data as listed   Chemistry      Component Value Date/Time   NA 139 08/28/2014 1323   K 4.1 08/28/2014 1323   CO2 25 08/28/2014 1323   BUN 9.3 08/28/2014 1323   CREATININE 0.9 08/28/2014 1323      Component Value Date/Time   CALCIUM 9.6 08/28/2014 1323   ALKPHOS 41 08/28/2014 1323   AST 14 08/28/2014 1323   ALT 14 08/28/2014 1323   BILITOT 0.29 08/28/2014 1323       Lab Results  Component Value Date   WBC 3.4* 09/12/2014   HGB 13.3 09/12/2014   HCT 39.3 09/12/2014   MCV 91.4 09/12/2014   PLT 215  09/12/2014   NEUTROABS 1.9 09/12/2014     ASSESSMENT & PLAN:  Breast cancer of upper-outer quadrant of left female breast Left breast invasive ductal carcinoma: ER/PR positive HER-2 positive Ki-67 of 25 percent: T2, N0, 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 Patient is here to sign consent for neoadjuvant chemotherapy with Taxotere, carboplatin, Herceptin and Perjeta to be given every 3 weeks for 6 cycles. I discussed the antiemetics regimen in great detail and provided her with all her prescriptions. She underwent chemotherapy counseling as well as echocardiogram which showed an ejection fraction of 55%. Port has been placed and is healed very well. She's starting chemotherapy on November 13.  Return to clinic one week after chemotherapy for followup and toxicity check. All of her questions have been answered including the question of how this treatment regimen was selected as it reviewed the studies that led to the pool of TCHP as the standard neoadjuvant chemotherapy regimen.      No orders of the defined types were placed in this encounter.   The patient has a good understanding of the overall plan. she agrees with it. She will call with any problems  that may develop before her next visit here.  I spent 20 minutes counseling the patient face to face. The total time spent in the appointment was 25 minutes and more than 50% was on counseling and review of test results    Rulon Eisenmenger, MD 09/17/2014 8:27 AM

## 2014-09-17 NOTE — Telephone Encounter (Signed)
Confirm appt d/t for 09/26/14.

## 2014-09-17 NOTE — Assessment & Plan Note (Signed)
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 Patient is here to sign consent for neoadjuvant chemotherapy with Taxotere, carboplatin, Herceptin and Perjeta to be given every 3 weeks for 6 cycles. I discussed the antiemetics regimen in great detail and provided her with all her prescriptions. She underwent chemotherapy counseling as well as echocardiogram which showed an ejection fraction of 55%. Port has been placed and is healed very well. She's starting chemotherapy on November 13.  Return to clinic one week after chemotherapy for followup and toxicity check. All of her questions have been answered including the question of how this treatment regimen was selected as it reviewed the studies that led to the pool of TCHP as the standard neoadjuvant chemotherapy regimen.

## 2014-09-18 ENCOUNTER — Ambulatory Visit: Payer: Managed Care, Other (non HMO)

## 2014-09-18 ENCOUNTER — Other Ambulatory Visit: Payer: Self-pay | Admitting: Hematology and Oncology

## 2014-09-18 DIAGNOSIS — C50412 Malignant neoplasm of upper-outer quadrant of left female breast: Secondary | ICD-10-CM

## 2014-09-19 ENCOUNTER — Ambulatory Visit (HOSPITAL_BASED_OUTPATIENT_CLINIC_OR_DEPARTMENT_OTHER): Payer: Managed Care, Other (non HMO)

## 2014-09-19 DIAGNOSIS — C50412 Malignant neoplasm of upper-outer quadrant of left female breast: Secondary | ICD-10-CM

## 2014-09-19 DIAGNOSIS — Z5111 Encounter for antineoplastic chemotherapy: Secondary | ICD-10-CM

## 2014-09-19 MED ORDER — SODIUM CHLORIDE 0.9 % IV SOLN
840.0000 mg | Freq: Once | INTRAVENOUS | Status: AC
  Administered 2014-09-19: 840 mg via INTRAVENOUS
  Filled 2014-09-19: qty 28

## 2014-09-19 MED ORDER — SODIUM CHLORIDE 0.9 % IJ SOLN
10.0000 mL | INTRAMUSCULAR | Status: DC | PRN
  Administered 2014-09-19: 10 mL
  Filled 2014-09-19: qty 10

## 2014-09-19 MED ORDER — DEXAMETHASONE SODIUM PHOSPHATE 20 MG/5ML IJ SOLN
20.0000 mg | Freq: Once | INTRAMUSCULAR | Status: AC
Start: 2014-09-19 — End: 2014-09-19
  Administered 2014-09-19: 20 mg via INTRAVENOUS

## 2014-09-19 MED ORDER — HEPARIN SOD (PORK) LOCK FLUSH 100 UNIT/ML IV SOLN
500.0000 [IU] | Freq: Once | INTRAVENOUS | Status: AC | PRN
  Administered 2014-09-19: 500 [IU]
  Filled 2014-09-19: qty 5

## 2014-09-19 MED ORDER — DIPHENHYDRAMINE HCL 25 MG PO CAPS
50.0000 mg | ORAL_CAPSULE | Freq: Once | ORAL | Status: AC
  Administered 2014-09-19: 50 mg via ORAL

## 2014-09-19 MED ORDER — TRASTUZUMAB CHEMO INJECTION 440 MG
8.0000 mg/kg | Freq: Once | INTRAVENOUS | Status: AC
  Administered 2014-09-19: 504 mg via INTRAVENOUS
  Filled 2014-09-19: qty 24

## 2014-09-19 MED ORDER — ONDANSETRON 16 MG/50ML IVPB (CHCC)
16.0000 mg | Freq: Once | INTRAVENOUS | Status: AC
  Administered 2014-09-19: 16 mg via INTRAVENOUS

## 2014-09-19 MED ORDER — SODIUM CHLORIDE 0.9 % IV SOLN
Freq: Once | INTRAVENOUS | Status: AC
  Administered 2014-09-19: 10:00:00 via INTRAVENOUS

## 2014-09-19 MED ORDER — DEXTROSE 5 % IV SOLN
75.0000 mg/m2 | Freq: Once | INTRAVENOUS | Status: AC
  Administered 2014-09-19: 130 mg via INTRAVENOUS
  Filled 2014-09-19: qty 13

## 2014-09-19 MED ORDER — SODIUM CHLORIDE 0.9 % IV SOLN
714.0000 mg | Freq: Once | INTRAVENOUS | Status: AC
  Administered 2014-09-19: 710 mg via INTRAVENOUS
  Filled 2014-09-19: qty 71

## 2014-09-19 MED ORDER — ACETAMINOPHEN 325 MG PO TABS
650.0000 mg | ORAL_TABLET | Freq: Once | ORAL | Status: AC
  Administered 2014-09-19: 650 mg via ORAL

## 2014-09-19 NOTE — Progress Notes (Signed)
Patient observed for one hour post Perjeta infusion. Patient denies any problems at this time. Proceed with Taxotere.

## 2014-09-19 NOTE — Patient Instructions (Signed)
[facility] Discharge Instructions for Patients Receiving Chemotherapy  Today you received the following chemotherapy agents Herceptin, Perjeta, Taxotere and Carboplatin.  To help prevent nausea and vomiting after your treatment, we encourage you to take your nausea medication as prescribed.   If you develop nausea and vomiting that is not controlled by your nausea medication, call the clinic.   BELOW ARE SYMPTOMS THAT SHOULD BE REPORTED IMMEDIATELY:  *FEVER GREATER THAN 100.5 F  *CHILLS WITH OR WITHOUT FEVER  NAUSEA AND VOMITING THAT IS NOT CONTROLLED WITH YOUR NAUSEA MEDICATION  *UNUSUAL SHORTNESS OF BREATH  *UNUSUAL BRUISING OR BLEEDING  TENDERNESS IN MOUTH AND THROAT WITH OR WITHOUT PRESENCE OF ULCERS  *URINARY PROBLEMS  *BOWEL PROBLEMS  UNUSUAL RASH Items with * indicate a potential emergency and should be followed up as soon as possible.  Feel free to call the clinic you have any questions or concerns. The clinic phone number is [phone].    

## 2014-09-20 ENCOUNTER — Ambulatory Visit (HOSPITAL_BASED_OUTPATIENT_CLINIC_OR_DEPARTMENT_OTHER): Payer: Managed Care, Other (non HMO)

## 2014-09-20 DIAGNOSIS — Z5189 Encounter for other specified aftercare: Secondary | ICD-10-CM

## 2014-09-20 DIAGNOSIS — C50412 Malignant neoplasm of upper-outer quadrant of left female breast: Secondary | ICD-10-CM

## 2014-09-20 MED ORDER — PEGFILGRASTIM INJECTION 6 MG/0.6ML ~~LOC~~
6.0000 mg | PREFILLED_SYRINGE | Freq: Once | SUBCUTANEOUS | Status: AC
  Administered 2014-09-20: 6 mg via SUBCUTANEOUS

## 2014-09-20 NOTE — Patient Instructions (Signed)
Pegfilgrastim injection What is this medicine? PEGFILGRASTIM (peg fil GRA stim) is a long-acting granulocyte colony-stimulating factor that stimulates the growth of neutrophils, a type of white blood cell important in the body's fight against infection. It is used to reduce the incidence of fever and infection in patients with certain types of cancer who are receiving chemotherapy that affects the bone marrow. This medicine may be used for other purposes; ask your health care provider or pharmacist if you have questions. COMMON BRAND NAME(S): Neulasta What should I tell my health care provider before I take this medicine? They need to know if you have any of these conditions: -latex allergy -ongoing radiation therapy -sickle cell disease -skin reactions to acrylic adhesives (On-Body Injector only) -an unusual or allergic reaction to pegfilgrastim, filgrastim, other medicines, foods, dyes, or preservatives -pregnant or trying to get pregnant -breast-feeding How should I use this medicine? This medicine is for injection under the skin. If you get this medicine at home, you will be taught how to prepare and give the pre-filled syringe or how to use the On-body Injector. Refer to the patient Instructions for Use for detailed instructions. Use exactly as directed. Take your medicine at regular intervals. Do not take your medicine more often than directed. It is important that you put your used needles and syringes in a special sharps container. Do not put them in a trash can. If you do not have a sharps container, call your pharmacist or healthcare provider to get one. Talk to your pediatrician regarding the use of this medicine in children. Special care may be needed. Overdosage: If you think you have taken too much of this medicine contact a poison control center or emergency room at once. NOTE: This medicine is only for you. Do not share this medicine with others. What if I miss a dose? It is  important not to miss your dose. Call your doctor or health care professional if you miss your dose. If you miss a dose due to an On-body Injector failure or leakage, a new dose should be administered as soon as possible using a single prefilled syringe for manual use. What may interact with this medicine? Interactions have not been studied. Give your health care provider a list of all the medicines, herbs, non-prescription drugs, or dietary supplements you use. Also tell them if you smoke, drink alcohol, or use illegal drugs. Some items may interact with your medicine. This list may not describe all possible interactions. Give your health care provider a list of all the medicines, herbs, non-prescription drugs, or dietary supplements you use. Also tell them if you smoke, drink alcohol, or use illegal drugs. Some items may interact with your medicine. What should I watch for while using this medicine? You may need blood work done while you are taking this medicine. If you are going to need a MRI, CT scan, or other procedure, tell your doctor that you are using this medicine (On-Body Injector only). What side effects may I notice from receiving this medicine? Side effects that you should report to your doctor or health care professional as soon as possible: -allergic reactions like skin rash, itching or hives, swelling of the face, lips, or tongue -dizziness -fever -pain, redness, or irritation at site where injected -pinpoint red spots on the skin -shortness of breath or breathing problems -stomach or side pain, or pain at the shoulder -swelling -tiredness -trouble passing urine Side effects that usually do not require medical attention (report to your doctor   or health care professional if they continue or are bothersome): -bone pain -muscle pain This list may not describe all possible side effects. Call your doctor for medical advice about side effects. You may report side effects to FDA at  1-800-FDA-1088. Where should I keep my medicine? Keep out of the reach of children. Store pre-filled syringes in a refrigerator between 2 and 8 degrees C (36 and 46 degrees F). Do not freeze. Keep in carton to protect from light. Throw away this medicine if it is left out of the refrigerator for more than 48 hours. Throw away any unused medicine after the expiration date. NOTE: This sheet is a summary. It may not cover all possible information. If you have questions about this medicine, talk to your doctor, pharmacist, or health care provider.  2015, [employer]. (2014-01-23 16:14:05)  

## 2014-09-22 ENCOUNTER — Telehealth: Payer: Self-pay | Admitting: *Deleted

## 2014-09-22 NOTE — Telephone Encounter (Signed)
REQUESTED A RETURN CALL FROM PT.

## 2014-09-23 ENCOUNTER — Telehealth: Payer: Self-pay | Admitting: *Deleted

## 2014-09-23 NOTE — Telephone Encounter (Signed)
Pt returned call for post chemo follow up.  Stated she was doing ok.  Denied nausea/vomiting; bladder functions fine; good appetite and drinking lots of fluids as tolerated.  Has mild bone pain from Neulasta injection.  Stated has constipation; instructed pt to take Colace ( either stool softener or laxative ) and increased po fluids.  Pt aware of next appt on 09/26/14.  Pt understood to call office with any new problems.

## 2014-09-26 ENCOUNTER — Ambulatory Visit (HOSPITAL_BASED_OUTPATIENT_CLINIC_OR_DEPARTMENT_OTHER): Payer: Managed Care, Other (non HMO) | Admitting: Hematology and Oncology

## 2014-09-26 ENCOUNTER — Other Ambulatory Visit (HOSPITAL_BASED_OUTPATIENT_CLINIC_OR_DEPARTMENT_OTHER): Payer: Managed Care, Other (non HMO)

## 2014-09-26 VITALS — BP 117/72 | HR 89 | Temp 97.6°F | Resp 18 | Ht 67.0 in | Wt 136.8 lb

## 2014-09-26 DIAGNOSIS — R509 Fever, unspecified: Secondary | ICD-10-CM

## 2014-09-26 DIAGNOSIS — C50412 Malignant neoplasm of upper-outer quadrant of left female breast: Secondary | ICD-10-CM

## 2014-09-26 DIAGNOSIS — Z170 Estrogen receptor positive status [ER+]: Secondary | ICD-10-CM

## 2014-09-26 LAB — COMPREHENSIVE METABOLIC PANEL (CC13)
ALT: 21 U/L (ref 0–55)
AST: 15 U/L (ref 5–34)
Albumin: 3.7 g/dL (ref 3.5–5.0)
Alkaline Phosphatase: 56 U/L (ref 40–150)
Anion Gap: 11 mEq/L (ref 3–11)
BUN: 10.7 mg/dL (ref 7.0–26.0)
CO2: 25 mEq/L (ref 22–29)
Calcium: 9.8 mg/dL (ref 8.4–10.4)
Chloride: 103 mEq/L (ref 98–109)
Creatinine: 0.9 mg/dL (ref 0.6–1.1)
Glucose: 122 mg/dl (ref 70–140)
Potassium: 4 mEq/L (ref 3.5–5.1)
Sodium: 139 mEq/L (ref 136–145)
Total Bilirubin: 0.23 mg/dL (ref 0.20–1.20)
Total Protein: 7 g/dL (ref 6.4–8.3)

## 2014-09-26 LAB — CBC WITH DIFFERENTIAL/PLATELET
BASO%: 0.7 % (ref 0.0–2.0)
Basophils Absolute: 0 10*3/uL (ref 0.0–0.1)
EOS%: 1 % (ref 0.0–7.0)
Eosinophils Absolute: 0 10*3/uL (ref 0.0–0.5)
HCT: 39.3 % (ref 34.8–46.6)
HGB: 13 g/dL (ref 11.6–15.9)
LYMPH%: 25.8 % (ref 14.0–49.7)
MCH: 30.9 pg (ref 25.1–34.0)
MCHC: 33.1 g/dL (ref 31.5–36.0)
MCV: 93.2 fL (ref 79.5–101.0)
MONO#: 1 10*3/uL — ABNORMAL HIGH (ref 0.1–0.9)
MONO%: 21.4 % — ABNORMAL HIGH (ref 0.0–14.0)
NEUT#: 2.5 10*3/uL (ref 1.5–6.5)
NEUT%: 51.1 % (ref 38.4–76.8)
Platelets: 158 10*3/uL (ref 145–400)
RBC: 4.22 10*6/uL (ref 3.70–5.45)
RDW: 12.2 % (ref 11.2–14.5)
WBC: 4.9 10*3/uL (ref 3.9–10.3)
lymph#: 1.3 10*3/uL (ref 0.9–3.3)

## 2014-09-26 NOTE — Assessment & Plan Note (Addendum)
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 Today is cycle 1 day 8 of neoadjuvant chemotherapy with Taxotere, carboplatin, Herceptin and Perjeta given once every 3 weeks. Chemotherapy related toxicities: Patient experiences following toxicities 1. Fatigue due to chemotherapy that started the day 4-5 (Tue and Wed) 2. Sore throat 3. Low-grade temperature  For the next cycle, we will see her back on day 4 or 5 instead of day 8. Since I think she needed a few days before day 8. I encouraged her to continue to monitor her symptoms and call is if she develops any problems or concerns. I reviewed her blood work today did not show any evidence of neutropenia or anemia or thrombocytopenia.  Monitoring very closely for chemotherapy related side effects. Return to clinic in 2 weeks for cycle 2

## 2014-09-26 NOTE — Progress Notes (Signed)
Patient Care Team: Thurnell Lose, MD as PCP - General (Obstetrics and Gynecology) Thurnell Lose, MD (Obstetrics and Gynecology) Erroll Luna, MD as Consulting Physician (General Surgery) Rulon Eisenmenger, MD as Consulting Physician (Hematology and Oncology) Thea Silversmith, MD as Consulting Physician (Radiation Oncology) Erroll Luna, MD as Consulting Physician (General Surgery) Rulon Eisenmenger, MD as Consulting Physician (Hematology and Oncology) Thea Silversmith, MD as Consulting Physician (Radiation Oncology)  DIAGNOSIS: Breast cancer of upper-outer quadrant of left female breast   Staging form: Breast, AJCC 7th Edition     Clinical: Stage IIA (T2, N0, cM0) - Unsigned       Staging comments: Staged at breast conference on 10.21.15      Pathologic: No stage assigned - Unsigned   SUMMARY OF ONCOLOGIC HISTORY:   Breast cancer of upper-outer quadrant of left female breast   08/20/2014 Initial Diagnosis Invasive ductal carcinoma grade 3, ER PR positive HER-2 positive ratio 3.5, Ki-67 25%; biopsy of satellite lesion fibroadenoma   08/26/2014 Breast MRI Left breast: Upper-outer quadrant 1.4 cm from nipple 1.9 x 2.5 x 2.6 cm enhancing mass with the 8 mm nodule 1 cm posterior Right breast: 1.4 x 1.6 cm mass upper outer quadrant biopsy proven fibroadenoma:   09/19/2014 -  Neo-Adjuvant Chemotherapy Neoadjuvant chemotherapy with Taxotere, carboplatin, Herceptin and Perjeta x6 cycles    CHIEF COMPLIANT: cycle 1 day 8 toxicity check  INTERVAL HISTORY: Kim Armstrong is a 38 year old Caucasian female with above-mentioned history of left-sided breast cancer that was HER-2 positive and is being treated with neoadjuvant chemotherapy with Taxotere carboplatin Herceptin and Perjeta. She received first cycle of chemotherapy a week ago and is here for toxicity check. After the chemotherapy she did well denies any nausea vomiting. She felt severely fatigued by Wednesday and she did not go to work on that  day. She has since been feeling a lot better. Has good energy levels.denies any fevers. The fibular low-grade temperature coming down but it went away. Slight sore throat.  REVIEW OF SYSTEMS:   Constitutional: Denies fevers, chills or abnormal weight loss Eyes: Denies blurriness of vision Ears, nose, mouth, throat, and face: Denies mucositis or sore throat Respiratory: Denies cough, dyspnea or wheezes Cardiovascular: Denies palpitation, chest discomfort or lower extremity swelling Gastrointestinal:  Denies nausea, heartburn or change in bowel habits Skin: Denies abnormal skin rashes Lymphatics: Denies new lymphadenopathy or easy bruising Neurological:Denies numbness, tingling or new weaknesses Behavioral/Psych: Mood is stable, no new changes  All other systems were reviewed with the patient and are negative.  I have reviewed the past medical history, past surgical history, social history and family history with the patient and they are unchanged from previous note.  ALLERGIES:  has No Known Allergies.  MEDICATIONS:  Current Outpatient Prescriptions  Medication Sig Dispense Refill  . acetaminophen (TYLENOL) 500 MG tablet Take 1,000 mg by mouth every 6 (six) hours as needed for mild pain or moderate pain.    Marland Kitchen ALPRAZolam (XANAX) 0.25 MG tablet Take 0.25 mg by mouth at bedtime as needed for anxiety (and for flying).     Marland Kitchen dexamethasone (DECADRON) 4 MG tablet Take 2 tablets (8 mg total) by mouth 2 (two) times daily. Start the day before Taxotere. Then again the day after chemo for 3 days. 30 tablet 1  . isometheptene-acetaminophen-dichloralphenazone (MIDRIN) 65-325-100 MG capsule Take 1-2 capsules by mouth 4 (four) times daily as needed for migraine.     . lidocaine-prilocaine (EMLA) cream Apply to affected area once 30 g 3  .  lidocaine-prilocaine (EMLA) cream Apply to affected area once 30 g 3  . LORazepam (ATIVAN) 0.5 MG tablet Take 1 tablet (0.5 mg total) by mouth every 6 (six) hours as  needed (Nausea or vomiting). 30 tablet 0  . ondansetron (ZOFRAN) 8 MG tablet Take 1 tablet (8 mg total) by mouth 2 (two) times daily. Start the day after chemo for 3 days. Then take as needed for nausea or vomiting. 30 tablet 1  . prochlorperazine (COMPAZINE) 10 MG tablet Take 1 tablet (10 mg total) by mouth every 6 (six) hours as needed (Nausea or vomiting). 30 tablet 1  . tranexamic acid (LYSTEDA) 650 MG TABS tablet Take 1,300 mg by mouth 3 (three) times daily as needed (heavy menstrual cycle).      No current facility-administered medications for this visit.    PHYSICAL EXAMINATION: ECOG PERFORMANCE STATUS: 1 - Symptomatic but completely ambulatory  Filed Vitals:   09/26/14 0825  BP: 117/72  Pulse: 89  Temp: 97.6 F (36.4 C)  Resp: 18   Filed Weights   09/26/14 0825  Weight: 136 lb 12.8 oz (62.052 kg)    GENERAL:alert, no distress and comfortable SKIN: skin color, texture, turgor are normal, no rashes or significant lesions EYES: normal, Conjunctiva are pink and non-injected, sclera clear OROPHARYNX:no exudate, no erythema and lips, buccal mucosa, and tongue normal  NECK: supple, thyroid normal size, non-tender, without nodularity LYMPH:  no palpable lymphadenopathy in the cervical, axillary or inguinal LUNGS: clear to auscultation and percussion with normal breathing effort HEART: regular rate & rhythm and no murmurs and no lower extremity edema ABDOMEN:abdomen soft, non-tender and normal bowel sounds Musculoskeletal:no cyanosis of digits and no clubbing  NEURO: alert & oriented x 3 with fluent speech, no focal motor/sensory deficits  LABORATORY DATA:  I have reviewed the data as listed   Chemistry      Component Value Date/Time   NA 139 09/26/2014 0817   K 4.0 09/26/2014 0817   CO2 25 09/26/2014 0817   BUN 10.7 09/26/2014 0817   CREATININE 0.9 09/26/2014 0817      Component Value Date/Time   CALCIUM 9.8 09/26/2014 0817   ALKPHOS 56 09/26/2014 0817   AST 15  09/26/2014 0817   ALT 21 09/26/2014 0817   BILITOT 0.23 09/26/2014 0817       Lab Results  Component Value Date   WBC 4.9 09/26/2014   HGB 13.0 09/26/2014   HCT 39.3 09/26/2014   MCV 93.2 09/26/2014   PLT 158 09/26/2014   NEUTROABS 2.5 09/26/2014     ASSESSMENT & PLAN:  Breast cancer of upper-outer quadrant of left female breast 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 Today is cycle 1 day 8 of neoadjuvant chemotherapy with Taxotere, carboplatin, Herceptin and Perjeta given once every 3 weeks. Chemotherapy related toxicities: Patient experiences following toxicities 1. Fatigue due to chemotherapy that started the day 4-5 (Tue and Wed) 2. Sore throat 3. Low-grade temperature  For the next cycle, we will see her back on day 4 or 5 instead of day 8. Since I think she needed a few days before day 8. I encouraged her to continue to monitor her symptoms and call is if she develops any problems or concerns. I reviewed her blood work today did not show any evidence of neutropenia or anemia or thrombocytopenia.  Monitoring very closely for chemotherapy related side effects.  Return to clinic in 2 weeks for cycle 2    No orders of the defined types were placed in this encounter.   The patient has a good understanding of the overall plan. she agrees with it. She will call with any problems that may develop before her next visit here.   Rulon Eisenmenger, MD 09/26/2014 9:14 AM

## 2014-09-28 ENCOUNTER — Encounter: Payer: Self-pay | Admitting: Hematology and Oncology

## 2014-09-29 ENCOUNTER — Other Ambulatory Visit: Payer: Self-pay | Admitting: Hematology and Oncology

## 2014-09-30 ENCOUNTER — Telehealth: Payer: Self-pay | Admitting: Hematology and Oncology

## 2014-09-30 NOTE — Telephone Encounter (Signed)
, °

## 2014-10-06 ENCOUNTER — Telehealth: Payer: Self-pay

## 2014-10-06 NOTE — Telephone Encounter (Signed)
LMOVM - returning pt call.  Pt to call back to clinic.

## 2014-10-07 ENCOUNTER — Telehealth: Payer: Self-pay

## 2014-10-07 NOTE — Telephone Encounter (Signed)
Returned pt call re: sores, pain and burning in "private areas"  Let pt know per Dr. Lindi Adie, if gyn testing was negative per patient report, he believes it is probably yeast.  He recommends pt try topical nystatin, available over the counter.  Pt does not believe it is yeast "knowing my body".  Advised patient chemo can cause very different reactions in our body from what we are used.  Let pt know to try the nystatin and call us back if it does not help.  Pt voiced understanding.

## 2014-10-09 ENCOUNTER — Encounter: Payer: Self-pay | Admitting: Genetic Counselor

## 2014-10-09 DIAGNOSIS — Z1379 Encounter for other screening for genetic and chromosomal anomalies: Secondary | ICD-10-CM | POA: Insufficient documentation

## 2014-10-10 ENCOUNTER — Ambulatory Visit (HOSPITAL_BASED_OUTPATIENT_CLINIC_OR_DEPARTMENT_OTHER): Payer: Managed Care, Other (non HMO) | Admitting: Hematology and Oncology

## 2014-10-10 ENCOUNTER — Other Ambulatory Visit (HOSPITAL_BASED_OUTPATIENT_CLINIC_OR_DEPARTMENT_OTHER): Payer: Managed Care, Other (non HMO)

## 2014-10-10 ENCOUNTER — Other Ambulatory Visit: Payer: Self-pay | Admitting: Hematology and Oncology

## 2014-10-10 ENCOUNTER — Telehealth: Payer: Self-pay | Admitting: *Deleted

## 2014-10-10 ENCOUNTER — Ambulatory Visit (HOSPITAL_BASED_OUTPATIENT_CLINIC_OR_DEPARTMENT_OTHER): Payer: Managed Care, Other (non HMO)

## 2014-10-10 ENCOUNTER — Telehealth: Payer: Self-pay | Admitting: Hematology and Oncology

## 2014-10-10 VITALS — BP 117/80 | HR 89 | Temp 97.7°F | Resp 18 | Ht 67.0 in | Wt 138.9 lb

## 2014-10-10 DIAGNOSIS — Z5111 Encounter for antineoplastic chemotherapy: Secondary | ICD-10-CM

## 2014-10-10 DIAGNOSIS — C50412 Malignant neoplasm of upper-outer quadrant of left female breast: Secondary | ICD-10-CM

## 2014-10-10 DIAGNOSIS — Z170 Estrogen receptor positive status [ER+]: Secondary | ICD-10-CM

## 2014-10-10 DIAGNOSIS — Z5112 Encounter for antineoplastic immunotherapy: Secondary | ICD-10-CM

## 2014-10-10 LAB — CBC WITH DIFFERENTIAL/PLATELET
BASO%: 0 % (ref 0.0–2.0)
Basophils Absolute: 0 10*3/uL (ref 0.0–0.1)
EOS%: 0 % (ref 0.0–7.0)
Eosinophils Absolute: 0 10*3/uL (ref 0.0–0.5)
HCT: 35.2 % (ref 34.8–46.6)
HGB: 12 g/dL (ref 11.6–15.9)
LYMPH%: 6.7 % — ABNORMAL LOW (ref 14.0–49.7)
MCH: 31.6 pg (ref 25.1–34.0)
MCHC: 34.1 g/dL (ref 31.5–36.0)
MCV: 92.6 fL (ref 79.5–101.0)
MONO#: 0.2 10*3/uL (ref 0.1–0.9)
MONO%: 3.2 % (ref 0.0–14.0)
NEUT#: 6.6 10*3/uL — ABNORMAL HIGH (ref 1.5–6.5)
NEUT%: 90.1 % — ABNORMAL HIGH (ref 38.4–76.8)
Platelets: 228 10*3/uL (ref 145–400)
RBC: 3.8 10*6/uL (ref 3.70–5.45)
RDW: 13.2 % (ref 11.2–14.5)
WBC: 7.3 10*3/uL (ref 3.9–10.3)
lymph#: 0.5 10*3/uL — ABNORMAL LOW (ref 0.9–3.3)
nRBC: 0 % (ref 0–0)

## 2014-10-10 LAB — COMPREHENSIVE METABOLIC PANEL (CC13)
ALT: 44 U/L (ref 0–55)
AST: 15 U/L (ref 5–34)
Albumin: 4 g/dL (ref 3.5–5.0)
Alkaline Phosphatase: 61 U/L (ref 40–150)
Anion Gap: 13 mEq/L — ABNORMAL HIGH (ref 3–11)
BUN: 12.5 mg/dL (ref 7.0–26.0)
CO2: 24 mEq/L (ref 22–29)
Calcium: 10.1 mg/dL (ref 8.4–10.4)
Chloride: 106 mEq/L (ref 98–109)
Creatinine: 0.9 mg/dL (ref 0.6–1.1)
EGFR: 86 mL/min/{1.73_m2} — ABNORMAL LOW (ref >90)
Glucose: 118 mg/dl (ref 70–140)
Potassium: 3.7 mEq/L (ref 3.5–5.1)
Sodium: 143 mEq/L (ref 136–145)
Total Bilirubin: 0.33 mg/dL (ref 0.20–1.20)
Total Protein: 6.8 g/dL (ref 6.4–8.3)

## 2014-10-10 MED ORDER — DOCETAXEL CHEMO INJECTION 160 MG/16ML
75.0000 mg/m2 | Freq: Once | INTRAVENOUS | Status: AC
  Administered 2014-10-10: 130 mg via INTRAVENOUS
  Filled 2014-10-10: qty 13

## 2014-10-10 MED ORDER — HEPARIN SOD (PORK) LOCK FLUSH 100 UNIT/ML IV SOLN
500.0000 [IU] | Freq: Once | INTRAVENOUS | Status: AC | PRN
  Administered 2014-10-10: 500 [IU]
  Filled 2014-10-10: qty 5

## 2014-10-10 MED ORDER — DIPHENHYDRAMINE HCL 25 MG PO CAPS
50.0000 mg | ORAL_CAPSULE | Freq: Once | ORAL | Status: AC
  Administered 2014-10-10: 50 mg via ORAL

## 2014-10-10 MED ORDER — DEXAMETHASONE SODIUM PHOSPHATE 20 MG/5ML IJ SOLN
20.0000 mg | Freq: Once | INTRAMUSCULAR | Status: AC
  Administered 2014-10-10: 20 mg via INTRAVENOUS

## 2014-10-10 MED ORDER — SODIUM CHLORIDE 0.9 % IV SOLN
420.0000 mg | Freq: Once | INTRAVENOUS | Status: AC
  Administered 2014-10-10: 420 mg via INTRAVENOUS
  Filled 2014-10-10: qty 14

## 2014-10-10 MED ORDER — ACETAMINOPHEN 325 MG PO TABS
650.0000 mg | ORAL_TABLET | Freq: Once | ORAL | Status: AC
  Administered 2014-10-10: 650 mg via ORAL

## 2014-10-10 MED ORDER — TRASTUZUMAB CHEMO INJECTION 440 MG
6.0000 mg/kg | Freq: Once | INTRAVENOUS | Status: AC
  Administered 2014-10-10: 378 mg via INTRAVENOUS
  Filled 2014-10-10: qty 18

## 2014-10-10 MED ORDER — SODIUM CHLORIDE 0.9 % IV SOLN
Freq: Once | INTRAVENOUS | Status: AC
  Administered 2014-10-10: 10:00:00 via INTRAVENOUS

## 2014-10-10 MED ORDER — CARBOPLATIN CHEMO INJECTION 600 MG/60ML
650.0000 mg | Freq: Once | INTRAVENOUS | Status: AC
  Administered 2014-10-10: 650 mg via INTRAVENOUS
  Filled 2014-10-10: qty 65

## 2014-10-10 MED ORDER — SODIUM CHLORIDE 0.9 % IJ SOLN
10.0000 mL | INTRAMUSCULAR | Status: DC | PRN
  Administered 2014-10-10: 10 mL
  Filled 2014-10-10: qty 10

## 2014-10-10 MED ORDER — ONDANSETRON 16 MG/50ML IVPB (CHCC)
16.0000 mg | Freq: Once | INTRAVENOUS | Status: AC
  Administered 2014-10-10: 16 mg via INTRAVENOUS

## 2014-10-10 NOTE — Progress Notes (Signed)
Patient Care Team: Thurnell Lose, MD as PCP - General (Obstetrics and Gynecology) Thurnell Lose, MD (Obstetrics and Gynecology) Erroll Luna, MD as Consulting Physician (General Surgery) Rulon Eisenmenger, MD as Consulting Physician (Hematology and Oncology) Thea Silversmith, MD as Consulting Physician (Radiation Oncology) Erroll Luna, MD as Consulting Physician (General Surgery) Rulon Eisenmenger, MD as Consulting Physician (Hematology and Oncology) Thea Silversmith, MD as Consulting Physician (Radiation Oncology)  DIAGNOSIS: Breast cancer of upper-outer quadrant of left female breast   Staging form: Breast, AJCC 7th Edition     Clinical: Stage IIA (T2, N0, cM0) - Unsigned       Staging comments: Staged at breast conference on 10.21.15      Pathologic: No stage assigned - Unsigned   SUMMARY OF ONCOLOGIC HISTORY:   Breast cancer of upper-outer quadrant of left female breast   08/20/2014 Initial Diagnosis Invasive ductal carcinoma grade 3, ER PR positive HER-2 positive ratio 3.5, Ki-67 25%; biopsy of satellite lesion fibroadenoma   08/26/2014 Breast MRI Left breast: Upper-outer quadrant 1.4 cm from nipple 1.9 x 2.5 x 2.6 cm enhancing mass with the 8 mm nodule 1 cm posterior Right breast: 1.4 x 1.6 cm mass upper outer quadrant biopsy proven fibroadenoma:   09/19/2014 -  Neo-Adjuvant Chemotherapy Neoadjuvant chemotherapy with Taxotere, carboplatin, Herceptin and Perjeta x6 cycles    CHIEF COMPLIANT:  Cycle 2 day 1 of [place] Perjeta  INTERVAL HISTORY: Kim Armstrong is a 38 year old Caucasian with above-mentioned history of left-sided breast cancer currently on neoadjuvant chemotherapy with Steele. After cycle 1 of chemotherapy, she had fatigue, low-grade temperature and sore throat. She also had a vaginal yeast infection which improved with over-the-counter yeast medication. She is here for cycle 2 of chemotherapy. She did not have any fevers or chills and is feeling pretty good. Denies  any tingling or numbness of hands or feet. Denies any nausea vomiting. She had alopecia after cycle 1 of chemotherapy.  REVIEW OF SYSTEMS:   Constitutional: Denies fevers, chills or abnormal weight loss; alopecia Eyes: Denies blurriness of vision Ears, nose, mouth, throat, and face: Denies mucositis or sore throat Respiratory: Denies cough, dyspnea or wheezes Cardiovascular: Denies palpitation, chest discomfort or lower extremity swelling Gastrointestinal:  Denies nausea, heartburn or change in bowel habits Skin: Denies abnormal skin rashes Lymphatics: Denies new lymphadenopathy or easy bruising Neurological:Denies numbness, tingling or new weaknesses Behavioral/Psych: Mood is stable, no new changes   All other systems were reviewed with the patient and are negative.  I have reviewed the past medical history, past surgical history, social history and family history with the patient and they are unchanged from previous note.  ALLERGIES:  has No Known Allergies.  MEDICATIONS:  Current Outpatient Prescriptions  Medication Sig Dispense Refill  . acetaminophen (TYLENOL) 500 MG tablet Take 1,000 mg by mouth every 6 (six) hours as needed for mild pain or moderate pain.    Marland Kitchen ALPRAZolam (XANAX) 0.25 MG tablet Take 0.25 mg by mouth at bedtime as needed for anxiety (and for flying).     Marland Kitchen dexamethasone (DECADRON) 4 MG tablet Take 2 tablets (8 mg total) by mouth 2 (two) times daily. Start the day before Taxotere. Then again the day after chemo for 3 days. 30 tablet 1  . isometheptene-acetaminophen-dichloralphenazone (MIDRIN) 65-325-100 MG capsule Take 1-2 capsules by mouth 4 (four) times daily as needed for migraine.     . lidocaine-prilocaine (EMLA) cream Apply to affected area once 30 g 3  . lidocaine-prilocaine (EMLA) cream Apply to affected  area once 30 g 3  . LORazepam (ATIVAN) 0.5 MG tablet Take 1 tablet (0.5 mg total) by mouth every 6 (six) hours as needed (Nausea or vomiting). 30 tablet 0   . ondansetron (ZOFRAN) 8 MG tablet Take 1 tablet (8 mg total) by mouth 2 (two) times daily. Start the day after chemo for 3 days. Then take as needed for nausea or vomiting. 30 tablet 1  . prochlorperazine (COMPAZINE) 10 MG tablet Take 1 tablet (10 mg total) by mouth every 6 (six) hours as needed (Nausea or vomiting). 30 tablet 1  . tranexamic acid (LYSTEDA) 650 MG TABS tablet Take 1,300 mg by mouth 3 (three) times daily as needed (heavy menstrual cycle).      No current facility-administered medications for this visit.    PHYSICAL EXAMINATION: ECOG PERFORMANCE STATUS: 1 - Symptomatic but completely ambulatory  Filed Vitals:   10/10/14 0908  BP: 117/80  Pulse: 89  Temp: 97.7 F (36.5 C)  Resp: 18   Filed Weights   10/10/14 0908  Weight: 138 lb 14.4 oz (63.005 kg)    GENERAL:alert, no distress and comfortable SKIN: skin color, texture, turgor are normal, no rashes or significant lesions EYES: normal, Conjunctiva are pink and non-injected, sclera clear OROPHARYNX:no exudate, no erythema and lips, buccal mucosa, and tongue normal  NECK: supple, thyroid normal size, non-tender, without nodularity LYMPH:  no palpable lymphadenopathy in the cervical, axillary or inguinal LUNGS: clear to auscultation and percussion with normal breathing effort HEART: regular rate & rhythm and no murmurs and no lower extremity edema ABDOMEN:abdomen soft, non-tender and normal bowel sounds Musculoskeletal:no cyanosis of digits and no clubbing  NEURO: alert & oriented x 3 with fluent speech, no focal motor/sensory deficits  LABORATORY DATA:  I have reviewed the data as listed   Chemistry      Component Value Date/Time   NA 139 09/26/2014 0817   K 4.0 09/26/2014 0817   CO2 25 09/26/2014 0817   BUN 10.7 09/26/2014 0817   CREATININE 0.9 09/26/2014 0817      Component Value Date/Time   CALCIUM 9.8 09/26/2014 0817   ALKPHOS 56 09/26/2014 0817   AST 15 09/26/2014 0817   ALT 21 09/26/2014 0817    BILITOT 0.23 09/26/2014 0817       Lab Results  Component Value Date   WBC 4.9 09/26/2014   HGB 13.0 09/26/2014   HCT 39.3 09/26/2014   MCV 93.2 09/26/2014   PLT 158 09/26/2014   NEUTROABS 2.5 09/26/2014   ASSESSMENT & PLAN:  Breast cancer of upper-outer quadrant of left female breast 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  Today is cycle 2 day 1 of neoadjuvant chemotherapy with Taxotere, carboplatin, Herceptin and Perjeta given once every 3 weeks.  Chemotherapy related toxicities: Patient experienced the following toxicities 1. Fatigue due to chemotherapy that started the day 4-5 (Tue and Wed) 2. Sore throat: resolved 3. Low-grade temperature: also resolved 4. Alopecia 5. Tingling and numbness lasted for 3-4 days after chemotherapy and then resolved. Denies any diarrhea nausea vomiting Return to clinic next week for toxicity check and we will see her next Wednesday for nadir count check. It appears that the patient may be needed during a few days earlier and hence in print on Wednesday instead.   Orders Placed This Encounter  Procedures  . CBC with Differential    Standing Status:  Future     Number of Occurrences:      Standing Expiration Date: 10/10/2015  . Comprehensive metabolic panel (Cmet) - [facility]    Standing Status: Future     Number of Occurrences:      Standing Expiration Date: 10/10/2015   The patient has a good understanding of the overall plan. she agrees with it. She will call with any problems that may develop before her next visit here.   Rulon Eisenmenger, MD 10/10/2014 9:27 AM

## 2014-10-10 NOTE — Telephone Encounter (Signed)
Per staff message and POF I have scheduled appts. Advised scheduler of appts. JMW  

## 2014-10-10 NOTE — Patient Instructions (Signed)
[facility] Discharge Instructions for Patients Receiving Chemotherapy  Today you received the following chemotherapy agents Herceptin, Prejeta, Taxotere and Carboplatin  To help prevent nausea and vomiting after your treatment, we encourage you to take your nausea medication as prescribed.   If you develop nausea and vomiting that is not controlled by your nausea medication, call the clinic.   BELOW ARE SYMPTOMS THAT SHOULD BE REPORTED IMMEDIATELY:  *FEVER GREATER THAN 100.5 F  *CHILLS WITH OR WITHOUT FEVER  NAUSEA AND VOMITING THAT IS NOT CONTROLLED WITH YOUR NAUSEA MEDICATION  *UNUSUAL SHORTNESS OF BREATH  *UNUSUAL BRUISING OR BLEEDING  TENDERNESS IN MOUTH AND THROAT WITH OR WITHOUT PRESENCE OF ULCERS  *URINARY PROBLEMS  *BOWEL PROBLEMS  UNUSUAL RASH Items with * indicate a potential emergency and should be followed up as soon as possible.  Feel free to call the clinic you have any questions or concerns. The clinic phone number is [phone].

## 2014-10-10 NOTE — Telephone Encounter (Signed)
, °

## 2014-10-10 NOTE — Addendum Note (Signed)
Addended by: Prentiss Bells on: 10/10/2014 09:57 AM   Modules accepted: Medications

## 2014-10-10 NOTE — Assessment & Plan Note (Addendum)
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  Today is cycle 2 day 1 of neoadjuvant chemotherapy with Taxotere, carboplatin, Herceptin and Perjeta given once every 3 weeks.  Chemotherapy related toxicities: Patient experienced the following toxicities 1. Fatigue due to chemotherapy that started the day 4-5 (Tue and Wed) 2. Sore throat: resolved 3. Low-grade temperature: also resolved 4. Alopecia 5. Tingling and numbness lasted for 3-4 days after chemotherapy and then resolved. Denies any diarrhea nausea vomiting Return to clinic next week for toxicity check and we will see her next Wednesday for nadir count check. It appears that the patient may be needed during a few days earlier and hence in print on Wednesday instead.

## 2014-10-11 ENCOUNTER — Ambulatory Visit (HOSPITAL_BASED_OUTPATIENT_CLINIC_OR_DEPARTMENT_OTHER): Payer: Managed Care, Other (non HMO)

## 2014-10-11 DIAGNOSIS — Z5189 Encounter for other specified aftercare: Secondary | ICD-10-CM

## 2014-10-11 DIAGNOSIS — C50412 Malignant neoplasm of upper-outer quadrant of left female breast: Secondary | ICD-10-CM

## 2014-10-11 MED ORDER — PEGFILGRASTIM INJECTION 6 MG/0.6ML ~~LOC~~
6.0000 mg | PREFILLED_SYRINGE | Freq: Once | SUBCUTANEOUS | Status: AC
  Administered 2014-10-11: 6 mg via SUBCUTANEOUS

## 2014-10-14 ENCOUNTER — Telehealth: Payer: Self-pay | Admitting: *Deleted

## 2014-10-14 NOTE — Telephone Encounter (Signed)
Spoke to pt to assess needs and tolerance to chemo. Pt received cycle 2 on 10/10/14. Pt relate she is doing well and tolerating treatment without many s/e. Encourage pt to call with concerns or needs. Received verbal understanding.

## 2014-10-15 ENCOUNTER — Telehealth: Payer: Self-pay | Admitting: Hematology and Oncology

## 2014-10-15 ENCOUNTER — Telehealth: Payer: Self-pay | Admitting: *Deleted

## 2014-10-15 ENCOUNTER — Ambulatory Visit (HOSPITAL_BASED_OUTPATIENT_CLINIC_OR_DEPARTMENT_OTHER): Payer: Managed Care, Other (non HMO) | Admitting: Hematology and Oncology

## 2014-10-15 ENCOUNTER — Other Ambulatory Visit (HOSPITAL_BASED_OUTPATIENT_CLINIC_OR_DEPARTMENT_OTHER): Payer: Managed Care, Other (non HMO)

## 2014-10-15 VITALS — BP 117/76 | HR 89 | Temp 97.6°F | Resp 18 | Ht 67.0 in | Wt 143.7 lb

## 2014-10-15 DIAGNOSIS — R5383 Other fatigue: Secondary | ICD-10-CM

## 2014-10-15 DIAGNOSIS — C50412 Malignant neoplasm of upper-outer quadrant of left female breast: Secondary | ICD-10-CM

## 2014-10-15 LAB — CBC WITH DIFFERENTIAL/PLATELET
BASO%: 0.9 % (ref 0.0–2.0)
Basophils Absolute: 0 10*3/uL (ref 0.0–0.1)
EOS%: 0.3 % (ref 0.0–7.0)
Eosinophils Absolute: 0 10*3/uL (ref 0.0–0.5)
HCT: 35.4 % (ref 34.8–46.6)
HGB: 11.6 g/dL (ref 11.6–15.9)
LYMPH%: 27.9 % (ref 14.0–49.7)
MCH: 31.2 pg (ref 25.1–34.0)
MCHC: 32.7 g/dL (ref 31.5–36.0)
MCV: 95.5 fL (ref 79.5–101.0)
MONO#: 0.1 10*3/uL (ref 0.1–0.9)
MONO%: 2.6 % (ref 0.0–14.0)
NEUT#: 3.8 10*3/uL (ref 1.5–6.5)
NEUT%: 68.3 % (ref 38.4–76.8)
Platelets: 182 10*3/uL (ref 145–400)
RBC: 3.7 10*6/uL (ref 3.70–5.45)
RDW: 12.8 % (ref 11.2–14.5)
WBC: 5.5 10*3/uL (ref 3.9–10.3)
lymph#: 1.5 10*3/uL (ref 0.9–3.3)

## 2014-10-15 LAB — COMPREHENSIVE METABOLIC PANEL (CC13)
ALT: 29 U/L (ref 0–55)
AST: 15 U/L (ref 5–34)
Albumin: 3.5 g/dL (ref 3.5–5.0)
Alkaline Phosphatase: 87 U/L (ref 40–150)
Anion Gap: 8 mEq/L (ref 3–11)
BUN: 10.2 mg/dL (ref 7.0–26.0)
CO2: 27 mEq/L (ref 22–29)
Calcium: 8.8 mg/dL (ref 8.4–10.4)
Chloride: 104 mEq/L (ref 98–109)
Creatinine: 0.7 mg/dL (ref 0.6–1.1)
EGFR: >90 mL/min/{1.73_m2} (ref >90)
Glucose: 98 mg/dl (ref 70–140)
Potassium: 3.6 mEq/L (ref 3.5–5.1)
Sodium: 139 mEq/L (ref 136–145)
Total Bilirubin: 0.41 mg/dL (ref 0.20–1.20)
Total Protein: 5.9 g/dL — ABNORMAL LOW (ref 6.4–8.3)

## 2014-10-15 NOTE — Telephone Encounter (Signed)
Per staff message and POF I have scheduled appts. Advised scheduler of appts. JMW  

## 2014-10-15 NOTE — Assessment & Plan Note (Signed)
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  Today is cycle 2 day 5 of neoadjuvant chemotherapy with Taxotere, carboplatin, Herceptin and Perjeta given once every 3 weeks. Patient is here for nadir count check. The reason why she is here for the nadir count check on Day 5 is because on day 8 with cycle 1, her blood counts were completely normal. It was on Day 5 that patient felt markedly fatigued and symptomatic suggestive of nadir on day 5.  Chemotherapy related toxicities: Patient experienced the following toxicities 1. Fatigue due to chemotherapy 2. Sore throat: resolved 3. Low-grade temperature: also resolved 4. Alopecia 5. Tingling and numbness lasted for 3-4 days after chemotherapy and then resolved. Denies any diarrhea nausea vomiting  Return to clinic with next cycle of chemotherapy.

## 2014-10-15 NOTE — Telephone Encounter (Signed)
, °

## 2014-10-15 NOTE — Progress Notes (Signed)
Patient Care Team: Thurnell Lose, MD as PCP - General (Obstetrics and Gynecology) Thurnell Lose, MD (Obstetrics and Gynecology) Erroll Luna, MD as Consulting Physician (General Surgery) Rulon Eisenmenger, MD as Consulting Physician (Hematology and Oncology) Thea Silversmith, MD as Consulting Physician (Radiation Oncology) Erroll Luna, MD as Consulting Physician (General Surgery) Rulon Eisenmenger, MD as Consulting Physician (Hematology and Oncology) Thea Silversmith, MD as Consulting Physician (Radiation Oncology)  DIAGNOSIS: Breast cancer of upper-outer quadrant of left female breast   Staging form: Breast, AJCC 7th Edition     Clinical: Stage IIA (T2, N0, cM0) - Unsigned       Staging comments: Staged at breast conference on 10.21.15      Pathologic: No stage assigned - Unsigned   SUMMARY OF ONCOLOGIC HISTORY:   Breast cancer of upper-outer quadrant of left female breast   08/20/2014 Initial Diagnosis Invasive ductal carcinoma grade 3, ER PR positive HER-2 positive ratio 3.5, Ki-67 25%; biopsy of satellite lesion fibroadenoma   08/26/2014 Breast MRI Left breast: Upper-outer quadrant 1.4 cm from nipple 1.9 x 2.5 x 2.6 cm enhancing mass with the 8 mm nodule 1 cm posterior Right breast: 1.4 x 1.6 cm mass upper outer quadrant biopsy proven fibroadenoma:   09/19/2014 -  Neo-Adjuvant Chemotherapy Neoadjuvant chemotherapy with Taxotere, carboplatin, Herceptin and Perjeta x6 cycles    CHIEF COMPLIANT: cycle 2 day 5 nadir count check  INTERVAL HISTORY: Kim Armstrong is a 38 year old Caucasian with above-mentioned history of left-sided breast cancer who is currently on neoadjuvant chemotherapy with [place]. She received cycle 2 of chemotherapy at 2 days ago and is here for a toxicity check. She reports having severe fatigue but other than that she denies any diarrhea nausea or vomiting. She had lost her hair completely. She denies any fevers or chills.  REVIEW OF SYSTEMS:    Constitutional: Denies fevers, chills or abnormal weight loss; alopecia Eyes: Denies blurriness of vision Ears, nose, mouth, throat, and face: Denies mucositis or sore throat Respiratory: Denies cough, dyspnea or wheezes Cardiovascular: Denies palpitation, chest discomfort or lower extremity swelling Gastrointestinal:  Denies nausea, heartburn or change in bowel habits Skin: Denies abnormal skin rashes Lymphatics: Denies new lymphadenopathy or easy bruising Neurological:Denies numbness, tingling or new weaknesses Behavioral/Psych: Mood is stable, no new changes  Breast:  denies any pain or lumps or nodules in either breasts All other systems were reviewed with the patient and are negative.  I have reviewed the past medical history, past surgical history, social history and family history with the patient and they are unchanged from previous note.  ALLERGIES:  has No Known Allergies.  MEDICATIONS:  Current Outpatient Prescriptions  Medication Sig Dispense Refill  . acetaminophen (TYLENOL) 500 MG tablet Take 1,000 mg by mouth every 6 (six) hours as needed for mild pain or moderate pain.    Marland Kitchen ALPRAZolam (XANAX) 0.25 MG tablet Take 0.25 mg by mouth at bedtime as needed for anxiety (and for flying).     Marland Kitchen dexamethasone (DECADRON) 4 MG tablet Take 2 tablets (8 mg total) by mouth 2 (two) times daily. Start the day before Taxotere. Then again the day after chemo for 3 days. 30 tablet 1  . diphenhydramine-acetaminophen (TYLENOL PM) 25-500 MG TABS Take 1 tablet by mouth at bedtime as needed.    . docusate sodium (COLACE) 100 MG capsule Take 100 mg by mouth 2 (two) times daily.    Marland Kitchen isometheptene-acetaminophen-dichloralphenazone (MIDRIN) 65-325-100 MG capsule Take 1-2 capsules by mouth 4 (four) times daily as  needed for migraine.     . lidocaine-prilocaine (EMLA) cream Apply to affected area once 30 g 3  . lidocaine-prilocaine (EMLA) cream Apply to affected area once 30 g 3  . LORazepam (ATIVAN)  0.5 MG tablet Take 1 tablet (0.5 mg total) by mouth every 6 (six) hours as needed (Nausea or vomiting). 30 tablet 0  . ondansetron (ZOFRAN) 8 MG tablet Take 1 tablet (8 mg total) by mouth 2 (two) times daily. Start the day after chemo for 3 days. Then take as needed for nausea or vomiting. 30 tablet 1  . prochlorperazine (COMPAZINE) 10 MG tablet Take 1 tablet (10 mg total) by mouth every 6 (six) hours as needed (Nausea or vomiting). 30 tablet 1  . tranexamic acid (LYSTEDA) 650 MG TABS tablet Take 1,300 mg by mouth 3 (three) times daily as needed (heavy menstrual cycle).      No current facility-administered medications for this visit.    PHYSICAL EXAMINATION: ECOG PERFORMANCE STATUS: 1 - Symptomatic but completely ambulatory  Filed Vitals:   10/15/14 0907  BP: 117/76  Pulse: 89  Temp: 97.6 F (36.4 C)  Resp: 18   Filed Weights   10/15/14 0907  Weight: 143 lb 11.2 oz (65.182 kg)    GENERAL:alert, no distress and comfortable; alopecia SKIN: skin color, texture, turgor are normal, no rashes or significant lesions EYES: normal, Conjunctiva are pink and non-injected, sclera clear OROPHARYNX:no exudate, no erythema and lips, buccal mucosa, and tongue normal  NECK: supple, thyroid normal size, non-tender, without nodularity LYMPH:  no palpable lymphadenopathy in the cervical, axillary or inguinal LUNGS: clear to auscultation and percussion with normal breathing effort HEART: regular rate & rhythm and no murmurs and no lower extremity edema ABDOMEN:abdomen soft, non-tender and normal bowel sounds Musculoskeletal:no cyanosis of digits and no clubbing  NEURO: alert & oriented x 3 with fluent speech, no further tingling or numbness  LABORATORY DATA:  I have reviewed the data as listed   Chemistry      Component Value Date/Time   NA 139 10/15/2014 0853   K 3.6 10/15/2014 0853   CO2 27 10/15/2014 0853   BUN 10.2 10/15/2014 0853   CREATININE 0.7 10/15/2014 0853      Component Value  Date/Time   CALCIUM 8.8 10/15/2014 0853   ALKPHOS 87 10/15/2014 0853   AST 15 10/15/2014 0853   ALT 29 10/15/2014 0853   BILITOT 0.41 10/15/2014 0853       Lab Results  Component Value Date   WBC 5.5 10/15/2014   HGB 11.6 10/15/2014   HCT 35.4 10/15/2014   MCV 95.5 10/15/2014   PLT 182 10/15/2014   NEUTROABS 3.8 10/15/2014   ASSESSMENT & PLAN:  Breast cancer of upper-outer quadrant of left female breast 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  Today is cycle 2 day 5 of neoadjuvant chemotherapy with Taxotere, carboplatin, Herceptin and Perjeta given once every 3 weeks. Patient is here for nadir count check. The reason why she is here for the nadir count check on Day 5 is because on day 8 with cycle 1, her blood counts were completely normal. It was on Day 5 that patient felt markedly fatigued and symptomatic suggestive of nadir on day 5.  Chemotherapy related toxicities: Patient experienced the following toxicities 1. Fatigue due to chemotherapy 2. Sore throat: resolved 3. Low-grade temperature: also resolved 4. Alopecia 5.  Tingling and numbness lasted for 3-4 days after chemotherapy and then resolved. Denies any diarrhea nausea vomiting  Return to clinic with next cycle of chemotherapy.   Orders Placed This Encounter  Procedures  . CBC with Differential    Standing Status: Future     Number of Occurrences:      Standing Expiration Date: 10/15/2015  . Comprehensive metabolic panel (Cmet) - [facility]    Standing Status: Future     Number of Occurrences:      Standing Expiration Date: 10/15/2015   The patient has a good understanding of the overall plan. she agrees with it. She will call with any problems that may develop before her next visit here.   Rulon Eisenmenger, MD 10/15/2014 1:11 PM

## 2014-10-29 ENCOUNTER — Encounter: Payer: Self-pay | Admitting: *Deleted

## 2014-10-29 NOTE — Progress Notes (Signed)
[name]  Patient called and requested counseling referral for adjustment/grief counseling as well as processing other needs not related to her cancer.  CSW referred patient to [facility] counseling and encouraged patient call with any questions or concerns.  Polo Riley, MSW, LCSW, OSW-C Clinical Social Worker [facility] [phone]

## 2014-10-30 ENCOUNTER — Other Ambulatory Visit (HOSPITAL_BASED_OUTPATIENT_CLINIC_OR_DEPARTMENT_OTHER): Payer: Managed Care, Other (non HMO)

## 2014-10-30 ENCOUNTER — Ambulatory Visit (HOSPITAL_BASED_OUTPATIENT_CLINIC_OR_DEPARTMENT_OTHER): Payer: Managed Care, Other (non HMO) | Admitting: Hematology and Oncology

## 2014-10-30 ENCOUNTER — Ambulatory Visit (HOSPITAL_BASED_OUTPATIENT_CLINIC_OR_DEPARTMENT_OTHER): Payer: Managed Care, Other (non HMO)

## 2014-10-30 ENCOUNTER — Telehealth: Payer: Self-pay | Admitting: Hematology and Oncology

## 2014-10-30 VITALS — BP 123/87 | HR 72 | Temp 98.5°F | Resp 20 | Ht 67.0 in | Wt 143.0 lb

## 2014-10-30 DIAGNOSIS — Z5111 Encounter for antineoplastic chemotherapy: Secondary | ICD-10-CM

## 2014-10-30 DIAGNOSIS — C50412 Malignant neoplasm of upper-outer quadrant of left female breast: Secondary | ICD-10-CM

## 2014-10-30 DIAGNOSIS — Z170 Estrogen receptor positive status [ER+]: Secondary | ICD-10-CM

## 2014-10-30 DIAGNOSIS — Z5112 Encounter for antineoplastic immunotherapy: Secondary | ICD-10-CM

## 2014-10-30 LAB — CBC WITH DIFFERENTIAL/PLATELET
BASO%: 0.8 % (ref 0.0–2.0)
Basophils Absolute: 0.1 10*3/uL (ref 0.0–0.1)
EOS%: 0 % (ref 0.0–7.0)
Eosinophils Absolute: 0 10*3/uL (ref 0.0–0.5)
HCT: 32.1 % — ABNORMAL LOW (ref 34.8–46.6)
HGB: 10.7 g/dL — ABNORMAL LOW (ref 11.6–15.9)
LYMPH%: 7.8 % — ABNORMAL LOW (ref 14.0–49.7)
MCH: 31.3 pg (ref 25.1–34.0)
MCHC: 33.4 g/dL (ref 31.5–36.0)
MCV: 93.5 fL (ref 79.5–101.0)
MONO#: 0.4 10*3/uL (ref 0.1–0.9)
MONO%: 4.6 % (ref 0.0–14.0)
NEUT#: 7.2 10*3/uL — ABNORMAL HIGH (ref 1.5–6.5)
NEUT%: 86.8 % — ABNORMAL HIGH (ref 38.4–76.8)
Platelets: 243 10*3/uL (ref 145–400)
RBC: 3.43 10*6/uL — ABNORMAL LOW (ref 3.70–5.45)
RDW: 14.4 % (ref 11.2–14.5)
WBC: 8.3 10*3/uL (ref 3.9–10.3)
lymph#: 0.7 10*3/uL — ABNORMAL LOW (ref 0.9–3.3)

## 2014-10-30 LAB — COMPREHENSIVE METABOLIC PANEL (CC13)
ALT: 49 U/L (ref 0–55)
AST: 20 U/L (ref 5–34)
Albumin: 4 g/dL (ref 3.5–5.0)
Alkaline Phosphatase: 72 U/L (ref 40–150)
Anion Gap: 9 mEq/L (ref 3–11)
BUN: 9 mg/dL (ref 7.0–26.0)
CO2: 24 mEq/L (ref 22–29)
Calcium: 9.5 mg/dL (ref 8.4–10.4)
Chloride: 106 mEq/L (ref 98–109)
Creatinine: 0.8 mg/dL (ref 0.6–1.1)
EGFR: >90 mL/min/{1.73_m2} (ref >90)
Glucose: 120 mg/dl (ref 70–140)
Potassium: 4 mEq/L (ref 3.5–5.1)
Sodium: 139 mEq/L (ref 136–145)
Total Bilirubin: 0.24 mg/dL (ref 0.20–1.20)
Total Protein: 6.8 g/dL (ref 6.4–8.3)

## 2014-10-30 MED ORDER — ACETAMINOPHEN 325 MG PO TABS
650.0000 mg | ORAL_TABLET | Freq: Once | ORAL | Status: AC
  Administered 2014-10-30: 650 mg via ORAL

## 2014-10-30 MED ORDER — DEXAMETHASONE SODIUM PHOSPHATE 20 MG/5ML IJ SOLN
20.0000 mg | Freq: Once | INTRAMUSCULAR | Status: AC
  Administered 2014-10-30: 20 mg via INTRAVENOUS

## 2014-10-30 MED ORDER — SODIUM CHLORIDE 0.9 % IJ SOLN
10.0000 mL | INTRAMUSCULAR | Status: DC | PRN
  Administered 2014-10-30: 10 mL
  Filled 2014-10-30: qty 10

## 2014-10-30 MED ORDER — HEPARIN SOD (PORK) LOCK FLUSH 100 UNIT/ML IV SOLN
500.0000 [IU] | Freq: Once | INTRAVENOUS | Status: AC | PRN
  Administered 2014-10-30: 500 [IU]
  Filled 2014-10-30: qty 5

## 2014-10-30 MED ORDER — SODIUM CHLORIDE 0.9 % IV SOLN
Freq: Once | INTRAVENOUS | Status: AC
  Administered 2014-10-30: 10:00:00 via INTRAVENOUS

## 2014-10-30 MED ORDER — DIPHENHYDRAMINE HCL 25 MG PO CAPS
50.0000 mg | ORAL_CAPSULE | Freq: Once | ORAL | Status: AC
  Administered 2014-10-30: 50 mg via ORAL

## 2014-10-30 MED ORDER — TRASTUZUMAB CHEMO INJECTION 440 MG
6.0000 mg/kg | Freq: Once | INTRAVENOUS | Status: AC
  Administered 2014-10-30: 378 mg via INTRAVENOUS
  Filled 2014-10-30: qty 18

## 2014-10-30 MED ORDER — SODIUM CHLORIDE 0.9 % IV SOLN
420.0000 mg | Freq: Once | INTRAVENOUS | Status: AC
  Administered 2014-10-30: 420 mg via INTRAVENOUS
  Filled 2014-10-30: qty 14

## 2014-10-30 MED ORDER — ONDANSETRON 16 MG/50ML IVPB (CHCC)
16.0000 mg | Freq: Once | INTRAVENOUS | Status: AC
  Administered 2014-10-30: 16 mg via INTRAVENOUS

## 2014-10-30 MED ORDER — DOCETAXEL CHEMO INJECTION 160 MG/16ML
75.0000 mg/m2 | Freq: Once | INTRAVENOUS | Status: AC
  Administered 2014-10-30: 130 mg via INTRAVENOUS
  Filled 2014-10-30: qty 13

## 2014-10-30 MED ORDER — CARBOPLATIN CHEMO INJECTION 600 MG/60ML
714.0000 mg | Freq: Once | INTRAVENOUS | Status: AC
  Administered 2014-10-30: 710 mg via INTRAVENOUS
  Filled 2014-10-30: qty 71

## 2014-10-30 NOTE — Progress Notes (Signed)
Patient Care Team: Thurnell Lose, MD as PCP - General (Obstetrics and Gynecology) Thurnell Lose, MD (Obstetrics and Gynecology) Erroll Luna, MD as Consulting Physician (General Surgery) Rulon Eisenmenger, MD as Consulting Physician (Hematology and Oncology) Thea Silversmith, MD as Consulting Physician (Radiation Oncology) Erroll Luna, MD as Consulting Physician (General Surgery) Rulon Eisenmenger, MD as Consulting Physician (Hematology and Oncology) Thea Silversmith, MD as Consulting Physician (Radiation Oncology)  DIAGNOSIS: Breast cancer of upper-outer quadrant of left female breast   Staging form: Breast, AJCC 7th Edition     Clinical: Stage IIA (T2, N0, cM0) - Unsigned       Staging comments: Staged at breast conference on 10.21.15      Pathologic: No stage assigned - Unsigned   SUMMARY OF ONCOLOGIC HISTORY:   Breast cancer of upper-outer quadrant of left female breast   08/20/2014 Initial Diagnosis Invasive ductal carcinoma grade 3, ER PR positive HER-2 positive ratio 3.5, Ki-67 25%; biopsy of satellite lesion fibroadenoma   08/26/2014 Breast MRI Left breast: Upper-outer quadrant 1.4 cm from nipple 1.9 x 2.5 x 2.6 cm enhancing mass with the 8 mm nodule 1 cm posterior Right breast: 1.4 x 1.6 cm mass upper outer quadrant biopsy proven fibroadenoma:   09/19/2014 -  Neo-Adjuvant Chemotherapy Neoadjuvant chemotherapy with Taxotere, carboplatin, Herceptin and Perjeta x6 cycles    CHIEF COMPLIANT: Cycle 3 of [place] Perjeta  INTERVAL HISTORY: Kim Armstrong is a 38 year old lady with above-mentioned history of left-sided breast cancer on neoadjuvant chemotherapy with [place]. Today's cycle #3. After cycle 2 she had a few days of increased fatigue mild nausea, slight tingling for 1-2 days and the tips of the fingers after chemotherapy. She feels much better today denies any problems or concerns.  REVIEW OF SYSTEMS:   Constitutional: Denies fevers, chills or abnormal weight loss Eyes:  Denies blurriness of vision Ears, nose, mouth, throat, and face: Denies mucositis or sore throat Respiratory: Denies cough, dyspnea or wheezes Cardiovascular: Denies palpitation, chest discomfort or lower extremity swelling Gastrointestinal:  Denies nausea, heartburn or change in bowel habits Skin: Denies abnormal skin rashes Lymphatics: Denies new lymphadenopathy or easy bruising Neurological:Denies numbness, tingling or new weaknesses Behavioral/Psych: Mood is stable, no new changes  Breast: Breast lump markedly smaller almost nonpalpable All other systems were reviewed with the patient and are negative.  I have reviewed the past medical history, past surgical history, social history and family history with the patient and they are unchanged from previous note.  ALLERGIES:  has No Known Allergies.  MEDICATIONS:  Current Outpatient Prescriptions  Medication Sig Dispense Refill  . acetaminophen (TYLENOL) 500 MG tablet Take 1,000 mg by mouth every 6 (six) hours as needed for mild pain or moderate pain.    Marland Kitchen ALPRAZolam (XANAX) 0.25 MG tablet Take 0.25 mg by mouth at bedtime as needed for anxiety (and for flying).     Marland Kitchen dexamethasone (DECADRON) 4 MG tablet Take 2 tablets (8 mg total) by mouth 2 (two) times daily. Start the day before Taxotere. Then again the day after chemo for 3 days. 30 tablet 1  . diphenhydramine-acetaminophen (TYLENOL PM) 25-500 MG TABS Take 1 tablet by mouth at bedtime as needed.    . docusate sodium (COLACE) 100 MG capsule Take 100 mg by mouth 2 (two) times daily.    Marland Kitchen isometheptene-acetaminophen-dichloralphenazone (MIDRIN) 65-325-100 MG capsule Take 1-2 capsules by mouth 4 (four) times daily as needed for migraine.     . lidocaine-prilocaine (EMLA) cream Apply to affected area once 30  g 3  . lidocaine-prilocaine (EMLA) cream Apply to affected area once 30 g 3  . LORazepam (ATIVAN) 0.5 MG tablet Take 1 tablet (0.5 mg total) by mouth every 6 (six) hours as needed (Nausea  or vomiting). 30 tablet 0  . ondansetron (ZOFRAN) 8 MG tablet Take 1 tablet (8 mg total) by mouth 2 (two) times daily. Start the day after chemo for 3 days. Then take as needed for nausea or vomiting. 30 tablet 1  . prochlorperazine (COMPAZINE) 10 MG tablet Take 1 tablet (10 mg total) by mouth every 6 (six) hours as needed (Nausea or vomiting). 30 tablet 1  . tranexamic acid (LYSTEDA) 650 MG TABS tablet Take 1,300 mg by mouth 3 (three) times daily as needed (heavy menstrual cycle).      No current facility-administered medications for this visit.    PHYSICAL EXAMINATION: ECOG PERFORMANCE STATUS: 1 - Symptomatic but completely ambulatory  Filed Vitals:   10/30/14 0918  BP: 123/87  Pulse: 72  Temp: 98.5 F (36.9 C)  Resp: 20   Filed Weights   10/30/14 0918  Weight: 143 lb (64.864 kg)    GENERAL:alert, no distress and comfortable SKIN: skin color, texture, turgor are normal, no rashes or significant lesions EYES: normal, Conjunctiva are pink and non-injected, sclera clear OROPHARYNX:no exudate, no erythema and lips, buccal mucosa, and tongue normal  NECK: supple, thyroid normal size, non-tender, without nodularity LYMPH:  no palpable lymphadenopathy in the cervical, axillary or inguinal LUNGS: clear to auscultation and percussion with normal breathing effort HEART: regular rate & rhythm and no murmurs and no lower extremity edema ABDOMEN:abdomen soft, non-tender and normal bowel sounds Musculoskeletal:no cyanosis of digits and no clubbing  NEURO: alert & oriented x 3 with fluent speech, no focal motor/sensory deficits  LABORATORY DATA:  I have reviewed the data as listed   Chemistry      Component Value Date/Time   NA 139 10/30/2014 0832   K 4.0 10/30/2014 0832   CO2 24 10/30/2014 0832   BUN 9.0 10/30/2014 0832   CREATININE 0.8 10/30/2014 0832      Component Value Date/Time   CALCIUM 9.5 10/30/2014 0832   ALKPHOS 72 10/30/2014 0832   AST 20 10/30/2014 0832   ALT 49  10/30/2014 0832   BILITOT 0.24 10/30/2014 0832       Lab Results  Component Value Date   WBC 8.3 10/30/2014   HGB 10.7* 10/30/2014   HCT 32.1* 10/30/2014   MCV 93.5 10/30/2014   PLT 243 10/30/2014   NEUTROABS 7.2* 10/30/2014    ASSESSMENT & PLAN:  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   Current treatment: Today is cycle 3 day 1 of neoadjuvant chemotherapy with Taxotere, carboplatin, Herceptin and Perjeta given once every 3 weeks. Patient reports of the breast lump is not palpable anymore. Hence we are not getting any point MRI.  Chemotherapy related toxicities: Patient experienced the following toxicities 1. Fatigue due to chemotherapy: Starts after she stops the Decadron treatment and lasts 4-5 days 2. Sore throat: resolved 3. Low-grade temperature: also resolved 4. Alopecia 5. Tingling and numbness lasted for 3-4 days after chemotherapy and then resolved. Denies any diarrhea, nausea or vomiting  Return to clinic in 3 weeks for cycle 4 of chemotherapy.   Orders Placed This Encounter  Procedures  . CBC  with Differential    Standing Status: Future     Number of Occurrences:      Standing Expiration Date: 10/30/2015  . Comprehensive metabolic panel (Cmet) - [facility]    Standing Status: Future     Number of Occurrences:      Standing Expiration Date: 10/30/2015   The patient has a good understanding of the overall plan. she agrees with it. She will call with any problems that may develop before her next visit here.   Rulon Eisenmenger, MD 10/30/2014 9:42 AM

## 2014-10-30 NOTE — Patient Instructions (Signed)
[facility] Discharge Instructions for Patients Receiving Chemotherapy  Today you received the following chemotherapy agents Herceptin, Perjeta, Taxotere, and Carboplatin.  To help prevent nausea and vomiting after your treatment, we encourage you to take your nausea medication as prescribed.    If you develop nausea and vomiting that is not controlled by your nausea medication, call the clinic.   BELOW ARE SYMPTOMS THAT SHOULD BE REPORTED IMMEDIATELY:  *FEVER GREATER THAN 100.5 F  *CHILLS WITH OR WITHOUT FEVER  NAUSEA AND VOMITING THAT IS NOT CONTROLLED WITH YOUR NAUSEA MEDICATION  *UNUSUAL SHORTNESS OF BREATH  *UNUSUAL BRUISING OR BLEEDING  TENDERNESS IN MOUTH AND THROAT WITH OR WITHOUT PRESENCE OF ULCERS  *URINARY PROBLEMS  *BOWEL PROBLEMS  UNUSUAL RASH Items with * indicate a potential emergency and should be followed up as soon as possible.  Feel free to call the clinic you have any questions or concerns. The clinic phone number is [phone].    

## 2014-10-30 NOTE — Telephone Encounter (Signed)
per pof to sch pt appt-gave pt copy of sch °

## 2014-10-30 NOTE — Assessment & Plan Note (Addendum)
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   Current treatment: Today is cycle 3 day 1 of neoadjuvant chemotherapy with Taxotere, carboplatin, Herceptin and Perjeta given once every 3 weeks. Patient reports of the breast lump is not palpable anymore. Hence we are not getting any point MRI.  Chemotherapy related toxicities: Patient experienced the following toxicities 1. Fatigue due to chemotherapy: Starts after she stops the Decadron treatment and lasts 4-5 days 2. Sore throat: resolved 3. Low-grade temperature: also resolved 4. Alopecia 5. Tingling and numbness lasted for 3-4 days after chemotherapy and then resolved. Denies any diarrhea, nausea or vomiting  Return to clinic in 3 weeks for cycle 4 of chemotherapy.

## 2014-11-01 ENCOUNTER — Ambulatory Visit (HOSPITAL_BASED_OUTPATIENT_CLINIC_OR_DEPARTMENT_OTHER): Payer: Managed Care, Other (non HMO)

## 2014-11-01 DIAGNOSIS — C50412 Malignant neoplasm of upper-outer quadrant of left female breast: Secondary | ICD-10-CM

## 2014-11-01 DIAGNOSIS — Z5189 Encounter for other specified aftercare: Secondary | ICD-10-CM

## 2014-11-01 MED ORDER — PEGFILGRASTIM INJECTION 6 MG/0.6ML ~~LOC~~
6.0000 mg | PREFILLED_SYRINGE | Freq: Once | SUBCUTANEOUS | Status: AC
  Administered 2014-11-01: 6 mg via SUBCUTANEOUS

## 2014-11-01 NOTE — Patient Instructions (Signed)
Pegfilgrastim injection What is this medicine? PEGFILGRASTIM (peg fil GRA stim) is a long-acting granulocyte colony-stimulating factor that stimulates the growth of neutrophils, a type of white blood cell important in the body's fight against infection. It is used to reduce the incidence of fever and infection in patients with certain types of cancer who are receiving chemotherapy that affects the bone marrow. This medicine may be used for other purposes; ask your health care provider or pharmacist if you have questions. COMMON BRAND NAME(S): Neulasta What should I tell my health care provider before I take this medicine? They need to know if you have any of these conditions: -latex allergy -ongoing radiation therapy -sickle cell disease -skin reactions to acrylic adhesives (On-Body Injector only) -an unusual or allergic reaction to pegfilgrastim, filgrastim, other medicines, foods, dyes, or preservatives -pregnant or trying to get pregnant -breast-feeding How should I use this medicine? This medicine is for injection under the skin. If you get this medicine at home, you will be taught how to prepare and give the pre-filled syringe or how to use the On-body Injector. Refer to the patient Instructions for Use for detailed instructions. Use exactly as directed. Take your medicine at regular intervals. Do not take your medicine more often than directed. It is important that you put your used needles and syringes in a special sharps container. Do not put them in a trash can. If you do not have a sharps container, call your pharmacist or healthcare provider to get one. Talk to your pediatrician regarding the use of this medicine in children. Special care may be needed. Overdosage: If you think you have taken too much of this medicine contact a poison control center or emergency room at once. NOTE: This medicine is only for you. Do not share this medicine with others. What if I miss a dose? It is  important not to miss your dose. Call your doctor or health care professional if you miss your dose. If you miss a dose due to an On-body Injector failure or leakage, a new dose should be administered as soon as possible using a single prefilled syringe for manual use. What may interact with this medicine? Interactions have not been studied. Give your health care provider a list of all the medicines, herbs, non-prescription drugs, or dietary supplements you use. Also tell them if you smoke, drink alcohol, or use illegal drugs. Some items may interact with your medicine. This list may not describe all possible interactions. Give your health care provider a list of all the medicines, herbs, non-prescription drugs, or dietary supplements you use. Also tell them if you smoke, drink alcohol, or use illegal drugs. Some items may interact with your medicine. What should I watch for while using this medicine? You may need blood work done while you are taking this medicine. If you are going to need a MRI, CT scan, or other procedure, tell your doctor that you are using this medicine (On-Body Injector only). What side effects may I notice from receiving this medicine? Side effects that you should report to your doctor or health care professional as soon as possible: -allergic reactions like skin rash, itching or hives, swelling of the face, lips, or tongue -dizziness -fever -pain, redness, or irritation at site where injected -pinpoint red spots on the skin -shortness of breath or breathing problems -stomach or side pain, or pain at the shoulder -swelling -tiredness -trouble passing urine Side effects that usually do not require medical attention (report to your doctor   or health care professional if they continue or are bothersome): -bone pain -muscle pain This list may not describe all possible side effects. Call your doctor for medical advice about side effects. You may report side effects to FDA at  1-800-FDA-1088. Where should I keep my medicine? Keep out of the reach of children. Store pre-filled syringes in a refrigerator between 2 and 8 degrees C (36 and 46 degrees F). Do not freeze. Keep in carton to protect from light. Throw away this medicine if it is left out of the refrigerator for more than 48 hours. Throw away any unused medicine after the expiration date. NOTE: This sheet is a summary. It may not cover all possible information. If you have questions about this medicine, talk to your doctor, pharmacist, or health care provider.  2015, [employer]. (2014-01-23 16:14:05)  

## 2014-11-06 ENCOUNTER — Telehealth: Payer: Self-pay

## 2014-11-06 NOTE — Telephone Encounter (Signed)
Returned pt call re: n/v.  Pt called on call last night and instructed to take dex and zofran.  Reports better but still nauseous, does not like to take dex due to s/e.  Pt is able to take in fluids.  Instructed pt to stagger her three nausea meds - ativan, compazine, and zofran - so that she keeps something in her system at all times.  Pt voiced understanding.

## 2014-11-07 HISTORY — PX: BREAST LUMPECTOMY: SHX2

## 2014-11-20 ENCOUNTER — Ambulatory Visit: Payer: Managed Care, Other (non HMO) | Admitting: Hematology and Oncology

## 2014-11-21 ENCOUNTER — Other Ambulatory Visit (HOSPITAL_BASED_OUTPATIENT_CLINIC_OR_DEPARTMENT_OTHER): Payer: Managed Care, Other (non HMO)

## 2014-11-21 ENCOUNTER — Other Ambulatory Visit: Payer: Managed Care, Other (non HMO)

## 2014-11-21 ENCOUNTER — Other Ambulatory Visit: Payer: Self-pay

## 2014-11-21 ENCOUNTER — Ambulatory Visit (HOSPITAL_BASED_OUTPATIENT_CLINIC_OR_DEPARTMENT_OTHER): Payer: Managed Care, Other (non HMO) | Admitting: Hematology and Oncology

## 2014-11-21 ENCOUNTER — Ambulatory Visit (HOSPITAL_BASED_OUTPATIENT_CLINIC_OR_DEPARTMENT_OTHER): Payer: Managed Care, Other (non HMO)

## 2014-11-21 ENCOUNTER — Telehealth: Payer: Self-pay | Admitting: Hematology and Oncology

## 2014-11-21 VITALS — BP 114/71 | HR 93 | Temp 98.3°F | Resp 20 | Ht 67.0 in | Wt 140.9 lb

## 2014-11-21 DIAGNOSIS — R530 Neoplastic (malignant) related fatigue: Secondary | ICD-10-CM

## 2014-11-21 DIAGNOSIS — C50412 Malignant neoplasm of upper-outer quadrant of left female breast: Secondary | ICD-10-CM

## 2014-11-21 DIAGNOSIS — Z170 Estrogen receptor positive status [ER+]: Secondary | ICD-10-CM

## 2014-11-21 DIAGNOSIS — D6481 Anemia due to antineoplastic chemotherapy: Secondary | ICD-10-CM

## 2014-11-21 DIAGNOSIS — Z5111 Encounter for antineoplastic chemotherapy: Secondary | ICD-10-CM

## 2014-11-21 DIAGNOSIS — Z5112 Encounter for antineoplastic immunotherapy: Secondary | ICD-10-CM

## 2014-11-21 DIAGNOSIS — L738 Other specified follicular disorders: Secondary | ICD-10-CM

## 2014-11-21 DIAGNOSIS — R112 Nausea with vomiting, unspecified: Secondary | ICD-10-CM

## 2014-11-21 LAB — CBC WITH DIFFERENTIAL/PLATELET
BASO%: 0 % (ref 0.0–2.0)
Basophils Absolute: 0 10*3/uL (ref 0.0–0.1)
EOS%: 0 % (ref 0.0–7.0)
Eosinophils Absolute: 0 10*3/uL (ref 0.0–0.5)
HCT: 31.4 % — ABNORMAL LOW (ref 34.8–46.6)
HGB: 10.7 g/dL — ABNORMAL LOW (ref 11.6–15.9)
LYMPH%: 17.6 % (ref 14.0–49.7)
MCH: 32.2 pg (ref 25.1–34.0)
MCHC: 34.1 g/dL (ref 31.5–36.0)
MCV: 94.6 fL (ref 79.5–101.0)
MONO#: 0.4 10*3/uL (ref 0.1–0.9)
MONO%: 6.9 % (ref 0.0–14.0)
NEUT#: 4.4 10*3/uL (ref 1.5–6.5)
NEUT%: 75.5 % (ref 38.4–76.8)
Platelets: 222 10*3/uL (ref 145–400)
RBC: 3.32 10*6/uL — ABNORMAL LOW (ref 3.70–5.45)
RDW: 16.1 % — ABNORMAL HIGH (ref 11.2–14.5)
WBC: 5.8 10*3/uL (ref 3.9–10.3)
lymph#: 1 10*3/uL (ref 0.9–3.3)

## 2014-11-21 LAB — COMPREHENSIVE METABOLIC PANEL (CC13)
ALT: 19 U/L (ref 0–55)
AST: 13 U/L (ref 5–34)
Albumin: 4.3 g/dL (ref 3.5–5.0)
Alkaline Phosphatase: 68 U/L (ref 40–150)
Anion Gap: 10 mEq/L (ref 3–11)
BUN: 11.4 mg/dL (ref 7.0–26.0)
CO2: 27 mEq/L (ref 22–29)
Calcium: 9.9 mg/dL (ref 8.4–10.4)
Chloride: 103 mEq/L (ref 98–109)
Creatinine: 0.8 mg/dL (ref 0.6–1.1)
EGFR: 88 mL/min/{1.73_m2} — ABNORMAL LOW (ref >90)
Glucose: 121 mg/dl (ref 70–140)
Potassium: 3.8 mEq/L (ref 3.5–5.1)
Sodium: 140 mEq/L (ref 136–145)
Total Bilirubin: 0.46 mg/dL (ref 0.20–1.20)
Total Protein: 7.1 g/dL (ref 6.4–8.3)

## 2014-11-21 MED ORDER — SODIUM CHLORIDE 0.9 % IV SOLN
710.0000 mg | Freq: Once | INTRAVENOUS | Status: AC
  Administered 2014-11-21: 710 mg via INTRAVENOUS
  Filled 2014-11-21: qty 71

## 2014-11-21 MED ORDER — ONDANSETRON 16 MG/50ML IVPB (CHCC)
16.0000 mg | Freq: Once | INTRAVENOUS | Status: AC
  Administered 2014-11-21: 16 mg via INTRAVENOUS

## 2014-11-21 MED ORDER — SODIUM CHLORIDE 0.9 % IV SOLN
Freq: Once | INTRAVENOUS | Status: AC
  Administered 2014-11-21: 10:00:00 via INTRAVENOUS

## 2014-11-21 MED ORDER — DOCETAXEL CHEMO INJECTION 160 MG/16ML
75.0000 mg/m2 | Freq: Once | INTRAVENOUS | Status: AC
  Administered 2014-11-21: 130 mg via INTRAVENOUS
  Filled 2014-11-21: qty 13

## 2014-11-21 MED ORDER — LORAZEPAM 0.5 MG PO TABS: 0.5000 mg | ORAL_TABLET | Freq: Four times a day (QID) | ORAL | Status: DC | PRN

## 2014-11-21 MED ORDER — SODIUM CHLORIDE 0.9 % IJ SOLN
10.0000 mL | INTRAMUSCULAR | Status: DC | PRN
  Administered 2014-11-21: 10 mL
  Filled 2014-11-21: qty 10

## 2014-11-21 MED ORDER — ACETAMINOPHEN 325 MG PO TABS
650.0000 mg | ORAL_TABLET | Freq: Once | ORAL | Status: AC
  Administered 2014-11-21: 650 mg via ORAL

## 2014-11-21 MED ORDER — HEPARIN SOD (PORK) LOCK FLUSH 100 UNIT/ML IV SOLN
500.0000 [IU] | Freq: Once | INTRAVENOUS | Status: AC | PRN
  Administered 2014-11-21: 500 [IU]
  Filled 2014-11-21: qty 5

## 2014-11-21 MED ORDER — SODIUM CHLORIDE 0.9 % IV SOLN
420.0000 mg | Freq: Once | INTRAVENOUS | Status: AC
  Administered 2014-11-21: 420 mg via INTRAVENOUS
  Filled 2014-11-21: qty 14

## 2014-11-21 MED ORDER — SODIUM CHLORIDE 0.9 % IV SOLN
6.0000 mg/kg | Freq: Once | INTRAVENOUS | Status: AC
  Administered 2014-11-21: 378 mg via INTRAVENOUS
  Filled 2014-11-21: qty 18

## 2014-11-21 MED ORDER — DEXAMETHASONE SODIUM PHOSPHATE 20 MG/5ML IJ SOLN
20.0000 mg | Freq: Once | INTRAMUSCULAR | Status: AC
  Administered 2014-11-21: 20 mg via INTRAVENOUS

## 2014-11-21 MED ORDER — DIPHENHYDRAMINE HCL 25 MG PO CAPS
50.0000 mg | ORAL_CAPSULE | Freq: Once | ORAL | Status: AC
  Administered 2014-11-21: 50 mg via ORAL

## 2014-11-21 NOTE — Assessment & Plan Note (Addendum)
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   Current treatment: Today is cycle 4 day 1 of neoadjuvant chemotherapy with Taxotere, carboplatin, Herceptin and Perjeta given once every 3 weeks.  Chemotherapy related toxicities: Patient experienced the following toxicities 1. Fatigue due to chemotherapy: Starts after she stops the Decadron treatment and lasts 4-5 days 2. Sore throat: resolved 3. Low-grade temperature: also resolved 4. Alopecia 5. Tingling and numbness lasted for 3-4 days after chemotherapy and then resolved. 6. Nausea and vomiting after cycle 3 lasted 24 hours improved with round-the-clock antiemetics 7. Scalp hair folliculitis: Not bothering the patient. Will watch it for now 8. Chemotherapy induced anemia grade 1 being monitored hemoglobin 10.7 Denies any diarrhea I reviewed her blood counts are adequate for treatment  Return to clinic in 3 weeks for cycle 5 of chemotherapy.

## 2014-11-21 NOTE — Patient Instructions (Signed)
[facility] Discharge Instructions for Patients Receiving Chemotherapy  Today you received the following chemotherapy agents; Herceptin, Perjeta, Taxotere and Carboplatin.   To help prevent nausea and vomiting after your treatment, we encourage you to take your nausea medication as directed.    If you develop nausea and vomiting that is not controlled by your nausea medication, call the clinic.   BELOW ARE SYMPTOMS THAT SHOULD BE REPORTED IMMEDIATELY:  *FEVER GREATER THAN 100.5 F  *CHILLS WITH OR WITHOUT FEVER  NAUSEA AND VOMITING THAT IS NOT CONTROLLED WITH YOUR NAUSEA MEDICATION  *UNUSUAL SHORTNESS OF BREATH  *UNUSUAL BRUISING OR BLEEDING  TENDERNESS IN MOUTH AND THROAT WITH OR WITHOUT PRESENCE OF ULCERS  *URINARY PROBLEMS  *BOWEL PROBLEMS  UNUSUAL RASH Items with * indicate a potential emergency and should be followed up as soon as possible.  Feel free to call the clinic you have any questions or concerns. The clinic phone number is [phone].

## 2014-11-21 NOTE — Progress Notes (Signed)
Patient Care Team: Thurnell Lose, MD as PCP - General (Obstetrics and Gynecology) Thurnell Lose, MD (Obstetrics and Gynecology) Erroll Luna, MD as Consulting Physician (General Surgery) Rulon Eisenmenger, MD as Consulting Physician (Hematology and Oncology) Thea Silversmith, MD as Consulting Physician (Radiation Oncology) Erroll Luna, MD as Consulting Physician (General Surgery) Rulon Eisenmenger, MD as Consulting Physician (Hematology and Oncology) Thea Silversmith, MD as Consulting Physician (Radiation Oncology)  DIAGNOSIS: Breast cancer of upper-outer quadrant of left female breast   Staging form: Breast, AJCC 7th Edition     Clinical: Stage IIA (T2, N0, cM0) - Unsigned       Staging comments: Staged at breast conference on 10.21.15      Pathologic: No stage assigned - Unsigned   SUMMARY OF ONCOLOGIC HISTORY:   Breast cancer of upper-outer quadrant of left female breast   08/20/2014 Initial Diagnosis Invasive ductal carcinoma grade 3, ER PR positive HER-2 positive ratio 3.5, Ki-67 25%; biopsy of satellite lesion fibroadenoma   08/26/2014 Breast MRI Left breast: Upper-outer quadrant 1.4 cm from nipple 1.9 x 2.5 x 2.6 cm enhancing mass with the 8 mm nodule 1 cm posterior Right breast: 1.4 x 1.6 cm mass upper outer quadrant biopsy proven fibroadenoma:   09/19/2014 -  Neo-Adjuvant Chemotherapy Neoadjuvant chemotherapy with Taxotere, carboplatin, Herceptin and Perjeta x6 cycles    CHIEF COMPLIANT: Cycle for [place] Perjeta  INTERVAL HISTORY: Kim Armstrong is a 39 year old lady with above-mentioned history of left-sided breast cancer being treated with neoadjuvant chemotherapy. Today is cycle 4 of [place]. She is tolerating chemotherapy fairly. After the last cycle of chemotherapy she had one day of intense nausea and vomiting. She took antiemetics around-the-clock and descending to have helped. This may have been associated with a upper respiratory cold infection. She did not have any  fevers or chills. She did not have any diarrhea. After the nausea subsided she had no further episodes of nausea. She has fairly good appetite and has been eating fairly well.  REVIEW OF SYSTEMS:   Constitutional: Denies fevers, chills or abnormal weight loss Eyes: Denies blurriness of vision Ears, nose, mouth, throat, and face: Denies mucositis or sore throat Respiratory: Denies cough, dyspnea or wheezes Cardiovascular: Denies palpitation, chest discomfort or lower extremity swelling Gastrointestinal:  Denies nausea, heartburn or change in bowel habits Skin: Denies abnormal skin rashes Lymphatics: Denies new lymphadenopathy or easy bruising Neurological:Denies numbness, tingling or new weaknesses Behavioral/Psych: Mood is stable, no new changes  All other systems were reviewed with the patient and are negative.  I have reviewed the past medical history, past surgical history, social history and family history with the patient and they are unchanged from previous note.  ALLERGIES:  has No Known Allergies.  MEDICATIONS:  Current Outpatient Prescriptions  Medication Sig Dispense Refill  . acetaminophen (TYLENOL) 500 MG tablet Take 1,000 mg by mouth every 6 (six) hours as needed for mild pain or moderate pain.    Marland Kitchen ALPRAZolam (XANAX) 0.25 MG tablet Take 0.25 mg by mouth at bedtime as needed for anxiety (and for flying).     Marland Kitchen dexamethasone (DECADRON) 4 MG tablet Take 2 tablets (8 mg total) by mouth 2 (two) times daily. Start the day before Taxotere. Then again the day after chemo for 3 days. 30 tablet 1  . diphenhydramine-acetaminophen (TYLENOL PM) 25-500 MG TABS Take 1 tablet by mouth at bedtime as needed.    . docusate sodium (COLACE) 100 MG capsule Take 100 mg by mouth 2 (two) times daily.    Marland Kitchen  isometheptene-acetaminophen-dichloralphenazone (MIDRIN) 65-325-100 MG capsule Take 1-2 capsules by mouth 4 (four) times daily as needed for migraine.     . lidocaine-prilocaine (EMLA) cream Apply  to affected area once 30 g 3  . lidocaine-prilocaine (EMLA) cream Apply to affected area once 30 g 3  . LORazepam (ATIVAN) 0.5 MG tablet Take 1 tablet (0.5 mg total) by mouth every 6 (six) hours as needed (Nausea or vomiting). 30 tablet 0  . ondansetron (ZOFRAN) 8 MG tablet Take 1 tablet (8 mg total) by mouth 2 (two) times daily. Start the day after chemo for 3 days. Then take as needed for nausea or vomiting. 30 tablet 1  . prochlorperazine (COMPAZINE) 10 MG tablet Take 1 tablet (10 mg total) by mouth every 6 (six) hours as needed (Nausea or vomiting). 30 tablet 1  . tranexamic acid (LYSTEDA) 650 MG TABS tablet Take 1,300 mg by mouth 3 (three) times daily as needed (heavy menstrual cycle).      No current facility-administered medications for this visit.    PHYSICAL EXAMINATION: ECOG PERFORMANCE STATUS: 1 - Symptomatic but completely ambulatory  Filed Vitals:   11/21/14 0912  BP: 114/71  Pulse: 93  Temp: 98.3 F (36.8 C)  Resp: 20   Filed Weights   11/21/14 0912  Weight: 140 lb 14.4 oz (63.912 kg)    GENERAL:alert, no distress and comfortable alopecia with mild folliculitis,  SKIN: skin color, texture, turgor are normal, no rashes or significant lesions EYES: normal, Conjunctiva are pink and non-injected, sclera clear OROPHARYNX:no exudate, no erythema and lips, buccal mucosa, and tongue normal  NECK: supple, thyroid normal size, non-tender, without nodularity LYMPH:  no palpable lymphadenopathy in the cervical, axillary or inguinal LUNGS: clear to auscultation and percussion with normal breathing effort HEART: regular rate & rhythm and no murmurs and no lower extremity edema ABDOMEN:abdomen soft, non-tender and normal bowel sounds Musculoskeletal:no cyanosis of digits and no clubbing  NEURO: alert & oriented x 3 with fluent speech, no focal motor/sensory deficits   LABORATORY DATA:  I have reviewed the data as listed   Chemistry      Component Value Date/Time   NA 140  11/21/2014 0900   K 3.8 11/21/2014 0900   CO2 27 11/21/2014 0900   BUN 11.4 11/21/2014 0900   CREATININE 0.8 11/21/2014 0900      Component Value Date/Time   CALCIUM 9.9 11/21/2014 0900   ALKPHOS 68 11/21/2014 0900   AST 13 11/21/2014 0900   ALT 19 11/21/2014 0900   BILITOT 0.46 11/21/2014 0900       Lab Results  Component Value Date   WBC 5.8 11/21/2014   HGB 10.7* 11/21/2014   HCT 31.4* 11/21/2014   MCV 94.6 11/21/2014   PLT 222 11/21/2014   NEUTROABS 4.4 11/21/2014   ASSESSMENT & PLAN:  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   Current treatment: Today is cycle 4 day 1 of neoadjuvant chemotherapy with Taxotere, carboplatin, Herceptin and Perjeta given once every 3 weeks.  Chemotherapy related toxicities: Patient experienced the following toxicities 1. Fatigue due to chemotherapy: Starts after she stops the Decadron treatment and lasts 4-5 days 2. Sore throat: resolved 3. Low-grade temperature: also resolved 4. Alopecia 5. Tingling and numbness lasted for 3-4 days after chemotherapy and then resolved. 6. Nausea and vomiting after cycle 3 lasted  24 hours improved with round-the-clock antiemetics 7. Scalp hair folliculitis: Not bothering the patient. Will watch it for now 8. Chemotherapy induced anemia grade 1 being monitored hemoglobin 10.7 Denies any diarrhea I reviewed her blood counts are adequate for treatment  Return to clinic in 3 weeks for cycle 5 of chemotherapy.    Orders Placed This Encounter  Procedures  . CBC with Differential    Standing Status: Future     Number of Occurrences:      Standing Expiration Date: 11/21/2015  . Comprehensive metabolic panel (Cmet) - [facility]    Standing Status: Future     Number of Occurrences:      Standing Expiration Date: 11/21/2015    The patient has a good understanding of the overall plan. she agrees with it. She will call with any problems that may develop before her next visit here.   Rulon Eisenmenger, MD

## 2014-11-22 ENCOUNTER — Ambulatory Visit: Payer: Managed Care, Other (non HMO)

## 2014-11-24 ENCOUNTER — Ambulatory Visit (HOSPITAL_BASED_OUTPATIENT_CLINIC_OR_DEPARTMENT_OTHER): Payer: Managed Care, Other (non HMO)

## 2014-11-24 ENCOUNTER — Telehealth: Payer: Self-pay | Admitting: *Deleted

## 2014-11-24 ENCOUNTER — Other Ambulatory Visit: Payer: Self-pay | Admitting: Hematology and Oncology

## 2014-11-24 DIAGNOSIS — C50412 Malignant neoplasm of upper-outer quadrant of left female breast: Secondary | ICD-10-CM

## 2014-11-24 DIAGNOSIS — Z5189 Encounter for other specified aftercare: Secondary | ICD-10-CM

## 2014-11-24 MED ORDER — PEGFILGRASTIM INJECTION 6 MG/0.6ML ~~LOC~~
6.0000 mg | PREFILLED_SYRINGE | Freq: Once | SUBCUTANEOUS | Status: AC
  Administered 2014-11-24: 6 mg via SUBCUTANEOUS
  Filled 2014-11-24: qty 0.6

## 2014-11-24 NOTE — Telephone Encounter (Signed)
Per staff message and POF I have scheduled appts. Advised scheduler of appts. JMW  

## 2014-12-05 ENCOUNTER — Encounter: Payer: Self-pay | Admitting: *Deleted

## 2014-12-12 ENCOUNTER — Ambulatory Visit (HOSPITAL_BASED_OUTPATIENT_CLINIC_OR_DEPARTMENT_OTHER): Payer: Managed Care, Other (non HMO)

## 2014-12-12 ENCOUNTER — Ambulatory Visit (HOSPITAL_BASED_OUTPATIENT_CLINIC_OR_DEPARTMENT_OTHER): Payer: Managed Care, Other (non HMO) | Admitting: Hematology and Oncology

## 2014-12-12 ENCOUNTER — Telehealth: Payer: Self-pay | Admitting: Hematology and Oncology

## 2014-12-12 ENCOUNTER — Other Ambulatory Visit (HOSPITAL_BASED_OUTPATIENT_CLINIC_OR_DEPARTMENT_OTHER): Payer: Managed Care, Other (non HMO)

## 2014-12-12 VITALS — BP 119/74 | HR 82 | Temp 98.1°F | Resp 18 | Ht 67.0 in | Wt 144.0 lb

## 2014-12-12 DIAGNOSIS — D6481 Anemia due to antineoplastic chemotherapy: Secondary | ICD-10-CM

## 2014-12-12 DIAGNOSIS — Z5112 Encounter for antineoplastic immunotherapy: Secondary | ICD-10-CM

## 2014-12-12 DIAGNOSIS — C50412 Malignant neoplasm of upper-outer quadrant of left female breast: Secondary | ICD-10-CM

## 2014-12-12 DIAGNOSIS — Z170 Estrogen receptor positive status [ER+]: Secondary | ICD-10-CM

## 2014-12-12 DIAGNOSIS — Z5111 Encounter for antineoplastic chemotherapy: Secondary | ICD-10-CM

## 2014-12-12 DIAGNOSIS — R112 Nausea with vomiting, unspecified: Secondary | ICD-10-CM

## 2014-12-12 DIAGNOSIS — R530 Neoplastic (malignant) related fatigue: Secondary | ICD-10-CM

## 2014-12-12 LAB — CBC WITH DIFFERENTIAL/PLATELET
BASO%: 0.1 % (ref 0.0–2.0)
Basophils Absolute: 0 10*3/uL (ref 0.0–0.1)
EOS%: 0 % (ref 0.0–7.0)
Eosinophils Absolute: 0 10*3/uL (ref 0.0–0.5)
HCT: 31.7 % — ABNORMAL LOW (ref 34.8–46.6)
HGB: 10.6 g/dL — ABNORMAL LOW (ref 11.6–15.9)
LYMPH%: 8.1 % — ABNORMAL LOW (ref 14.0–49.7)
MCH: 32.6 pg (ref 25.1–34.0)
MCHC: 33.4 g/dL (ref 31.5–36.0)
MCV: 97.5 fL (ref 79.5–101.0)
MONO#: 0.2 10*3/uL (ref 0.1–0.9)
MONO%: 2.6 % (ref 0.0–14.0)
NEUT#: 6.3 10*3/uL (ref 1.5–6.5)
NEUT%: 89.2 % — ABNORMAL HIGH (ref 38.4–76.8)
Platelets: 245 10*3/uL (ref 145–400)
RBC: 3.25 10*6/uL — ABNORMAL LOW (ref 3.70–5.45)
RDW: 16.4 % — ABNORMAL HIGH (ref 11.2–14.5)
WBC: 7 10*3/uL (ref 3.9–10.3)
lymph#: 0.6 10*3/uL — ABNORMAL LOW (ref 0.9–3.3)

## 2014-12-12 LAB — COMPREHENSIVE METABOLIC PANEL (CC13)
ALT: 22 U/L (ref 0–55)
AST: 16 U/L (ref 5–34)
Albumin: 4.1 g/dL (ref 3.5–5.0)
Alkaline Phosphatase: 80 U/L (ref 40–150)
Anion Gap: 8 mEq/L (ref 3–11)
BUN: 13.3 mg/dL (ref 7.0–26.0)
CO2: 24 mEq/L (ref 22–29)
Calcium: 9.6 mg/dL (ref 8.4–10.4)
Chloride: 108 mEq/L (ref 98–109)
Creatinine: 0.7 mg/dL (ref 0.6–1.1)
EGFR: >90 mL/min/{1.73_m2} (ref >90)
Glucose: 118 mg/dl (ref 70–140)
Potassium: 4.2 mEq/L (ref 3.5–5.1)
Sodium: 140 mEq/L (ref 136–145)
Total Bilirubin: 0.27 mg/dL (ref 0.20–1.20)
Total Protein: 7 g/dL (ref 6.4–8.3)

## 2014-12-12 MED ORDER — SODIUM CHLORIDE 0.9 % IV SOLN
420.0000 mg | Freq: Once | INTRAVENOUS | Status: AC
  Administered 2014-12-12: 420 mg via INTRAVENOUS
  Filled 2014-12-12: qty 14

## 2014-12-12 MED ORDER — SODIUM CHLORIDE 0.9 % IV SOLN
Freq: Once | INTRAVENOUS | Status: AC
  Administered 2014-12-12: 10:00:00 via INTRAVENOUS

## 2014-12-12 MED ORDER — SODIUM CHLORIDE 0.9 % IJ SOLN
10.0000 mL | INTRAMUSCULAR | Status: DC | PRN
  Administered 2014-12-12: 10 mL
  Filled 2014-12-12: qty 10

## 2014-12-12 MED ORDER — DOCETAXEL CHEMO INJECTION 160 MG/16ML
75.0000 mg/m2 | Freq: Once | INTRAVENOUS | Status: AC
  Administered 2014-12-12: 130 mg via INTRAVENOUS
  Filled 2014-12-12: qty 13

## 2014-12-12 MED ORDER — ONDANSETRON 16 MG/50ML IVPB (CHCC)
16.0000 mg | Freq: Once | INTRAVENOUS | Status: AC
  Administered 2014-12-12: 16 mg via INTRAVENOUS

## 2014-12-12 MED ORDER — ACETAMINOPHEN 325 MG PO TABS
650.0000 mg | ORAL_TABLET | Freq: Once | ORAL | Status: AC
  Administered 2014-12-12: 650 mg via ORAL

## 2014-12-12 MED ORDER — DIPHENHYDRAMINE HCL 25 MG PO CAPS
50.0000 mg | ORAL_CAPSULE | Freq: Once | ORAL | Status: AC
  Administered 2014-12-12: 50 mg via ORAL

## 2014-12-12 MED ORDER — DEXAMETHASONE SODIUM PHOSPHATE 20 MG/5ML IJ SOLN
20.0000 mg | Freq: Once | INTRAMUSCULAR | Status: AC
  Administered 2014-12-12: 20 mg via INTRAVENOUS

## 2014-12-12 MED ORDER — SODIUM CHLORIDE 0.9 % IV SOLN
714.0000 mg | Freq: Once | INTRAVENOUS | Status: AC
  Administered 2014-12-12: 710 mg via INTRAVENOUS
  Filled 2014-12-12: qty 71

## 2014-12-12 MED ORDER — TRASTUZUMAB CHEMO INJECTION 440 MG
6.0000 mg/kg | Freq: Once | INTRAVENOUS | Status: AC
  Administered 2014-12-12: 378 mg via INTRAVENOUS
  Filled 2014-12-12: qty 18

## 2014-12-12 MED ORDER — HEPARIN SOD (PORK) LOCK FLUSH 100 UNIT/ML IV SOLN
500.0000 [IU] | Freq: Once | INTRAVENOUS | Status: AC | PRN
  Administered 2014-12-12: 500 [IU]
  Filled 2014-12-12: qty 5

## 2014-12-12 NOTE — Telephone Encounter (Signed)
, °

## 2014-12-12 NOTE — Progress Notes (Signed)
Patient Care Team: Thurnell Lose, MD as PCP - General (Obstetrics and Gynecology) Thurnell Lose, MD (Obstetrics and Gynecology) Erroll Luna, MD as Consulting Physician (General Surgery) Rulon Eisenmenger, MD as Consulting Physician (Hematology and Oncology) Thea Silversmith, MD as Consulting Physician (Radiation Oncology) Erroll Luna, MD as Consulting Physician (General Surgery) Rulon Eisenmenger, MD as Consulting Physician (Hematology and Oncology) Thea Silversmith, MD as Consulting Physician (Radiation Oncology)  DIAGNOSIS: Breast cancer of upper-outer quadrant of left female breast   Staging form: Breast, AJCC 7th Edition     Clinical: Stage IIA (T2, N0, cM0) - Unsigned       Staging comments: Staged at breast conference on 10.21.15      Pathologic: No stage assigned - Unsigned   SUMMARY OF ONCOLOGIC HISTORY:   Breast cancer of upper-outer quadrant of left female breast   08/20/2014 Initial Diagnosis Invasive ductal carcinoma grade 3, ER PR positive HER-2 positive ratio 3.5, Ki-67 25%; biopsy of satellite lesion fibroadenoma   08/26/2014 Breast MRI Left breast: Upper-outer quadrant 1.4 cm from nipple 1.9 x 2.5 x 2.6 cm enhancing mass with the 8 mm nodule 1 cm posterior Right breast: 1.4 x 1.6 cm mass upper outer quadrant biopsy proven fibroadenoma:   09/19/2014 -  Neo-Adjuvant Chemotherapy Neoadjuvant chemotherapy with Taxotere, carboplatin, Herceptin and Perjeta x6 cycles    CHIEF COMPLIANT: cycle 5 [facility] Perjeta  INTERVAL HISTORY: Kim Armstrong is a 39 year old lady with above-mentioned history of left-sided breast cancer currently on neoadjuvant chemotherapy with [place] and Perjeta. She is tolerating it fairly well without any major problems. Neuropathy is very transient and last 3-4 days. Overall she tolerated cycle 4 much better than before. Energy levels have recovered much quicker. Denies any fevers or chills or nausea or vomiting. Denies any diarrhea.  REVIEW OF SYSTEMS:    Constitutional: Denies fevers, chills or abnormal weight loss Eyes: Denies blurriness of vision Ears, nose, mouth, throat, and face: Denies mucositis or sore throat Respiratory: Denies cough, dyspnea or wheezes Cardiovascular: Denies palpitation, chest discomfort or lower extremity swelling Gastrointestinal:  Denies nausea, heartburn or change in bowel habits Skin: Denies abnormal skin rashes Lymphatics: Denies new lymphadenopathy or easy bruising Neurological:Denies numbness, tingling or new weaknesses Behavioral/Psych: Mood is stable, no new changes  All other systems were reviewed with the patient and are negative.  I have reviewed the past medical history, past surgical history, social history and family history with the patient and they are unchanged from previous note.  ALLERGIES:  has No Known Allergies.  MEDICATIONS:  Current Outpatient Prescriptions  Medication Sig Dispense Refill  . acetaminophen (TYLENOL) 500 MG tablet Take 1,000 mg by mouth every 6 (six) hours as needed for mild pain or moderate pain.    Marland Kitchen ALPRAZolam (XANAX) 0.25 MG tablet Take 0.25 mg by mouth at bedtime as needed for anxiety (and for flying).     Marland Kitchen dexamethasone (DECADRON) 4 MG tablet TAKE TWO TABLETS BY MOUTH TWICE DAILY. START THE DAY BEFORE TAXOTERE. THEN AGAIN THE DAY AFTER CHEMO FOR 3 DAYS  30 tablet 0  . diphenhydramine-acetaminophen (TYLENOL PM) 25-500 MG TABS Take 1 tablet by mouth at bedtime as needed.    . docusate sodium (COLACE) 100 MG capsule Take 100 mg by mouth 2 (two) times daily.    Marland Kitchen isometheptene-acetaminophen-dichloralphenazone (MIDRIN) 65-325-100 MG capsule Take 1-2 capsules by mouth 4 (four) times daily as needed for migraine.     . lidocaine-prilocaine (EMLA) cream Apply to affected area once 30 g 3  .  lidocaine-prilocaine (EMLA) cream Apply to affected area once 30 g 3  . LORazepam (ATIVAN) 0.5 MG tablet Take 1 tablet (0.5 mg total) by mouth every 6 (six) hours as needed (Nausea or  vomiting). 30 tablet 0  . ondansetron (ZOFRAN) 8 MG tablet Take 1 tablet (8 mg total) by mouth 2 (two) times daily. Start the day after chemo for 3 days. Then take as needed for nausea or vomiting. 30 tablet 1  . prochlorperazine (COMPAZINE) 10 MG tablet Take 1 tablet (10 mg total) by mouth every 6 (six) hours as needed (Nausea or vomiting). 30 tablet 1  . tranexamic acid (LYSTEDA) 650 MG TABS tablet Take 1,300 mg by mouth 3 (three) times daily as needed (heavy menstrual cycle).      No current facility-administered medications for this visit.    PHYSICAL EXAMINATION: ECOG PERFORMANCE STATUS: 1 - Symptomatic but completely ambulatory  Filed Vitals:   12/12/14 0859  BP: 119/74  Pulse: 82  Temp: 98.1 F (36.7 C)  Resp: 18   Filed Weights   12/12/14 0859  Weight: 144 lb (65.318 kg)    GENERAL:alert, no distress and comfortable SKIN: skin color, texture, turgor are normal, no rashes or significant lesions EYES: normal, Conjunctiva are pink and non-injected, sclera clear OROPHARYNX:no exudate, no erythema and lips, buccal mucosa, and tongue normal  NECK: supple, thyroid normal size, non-tender, without nodularity LYMPH:  no palpable lymphadenopathy in the cervical, axillary or inguinal LUNGS: clear to auscultation and percussion with normal breathing effort HEART: regular rate & rhythm and no murmurs and no lower extremity edema ABDOMEN:abdomen soft, non-tender and normal bowel sounds Musculoskeletal:no cyanosis of digits and no clubbing  NEURO: alert & oriented x 3 with fluent speech, mild peripheral neuropathy   LABORATORY DATA:  I have reviewed the data as listed   Chemistry      Component Value Date/Time   NA 140 12/12/2014 0845   K 4.2 12/12/2014 0845   CO2 24 12/12/2014 0845   BUN 13.3 12/12/2014 0845   CREATININE 0.7 12/12/2014 0845      Component Value Date/Time   CALCIUM 9.6 12/12/2014 0845   ALKPHOS 80 12/12/2014 0845   AST 16 12/12/2014 0845   ALT 22  12/12/2014 0845   BILITOT 0.27 12/12/2014 0845       Lab Results  Component Value Date   WBC 7.0 12/12/2014   HGB 10.6* 12/12/2014   HCT 31.7* 12/12/2014   MCV 97.5 12/12/2014   PLT 245 12/12/2014   NEUTROABS 6.3 12/12/2014    ASSESSMENT & PLAN:  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   Current treatment: Today is cycle 5 day 1 of neoadjuvant chemotherapy with Taxotere, carboplatin, Herceptin and Perjeta given once every 3 weeks. Tolerated cycle 4 of chemotherapy much better. Cardiac monitoring: echocardiogram October 2015 EF 55% Chemotherapy related toxicities: Patient experienced the following toxicities 1. Fatigue due to chemotherapy: Starts after she stops the Decadron treatment and lasts 4-5 days 2. Sore throat: resolved 3. Low-grade temperature: also resolved 4. Alopecia 5. Tingling and numbness lasted for 3-4 days after chemotherapy and then resolved. 6. Nausea and vomiting after cycle 3 lasted 24 hours improved with round-the-clock antiemetics 7. Scalp hair folliculitis: Not bothering the patient. Will watch it for now 8. Chemotherapy induced anemia grade 1 being monitored hemoglobin 10.6 Denies  any diarrhea I reviewed her blood counts are adequate for treatment  Return to clinic in 3 weeks for cycle 6 of chemotherapy.  Plan: 1.  Following cycle 6 of chemotherapy, we will arrange for breast MRI 2.  Presentation at tumor board and surgery referral. 3.  Continue with Herceptin maintenance every 3 weeks    Orders Placed This Encounter  Procedures  . MR Breast Bilateral W Contrast    Standing Status: Future     Number of Occurrences:      Standing Expiration Date: 12/12/2015    Order Specific Question:  Reason for Exam (SYMPTOM  OR DIAGNOSIS REQUIRED)    Answer:  Post  neoadjuvant chemo breast cancer    Order Specific Question:  Preferred imaging location?    Answer:  [facility]    Order Specific Question:  Does the patient have a pacemaker or implanted devices?    Answer:  No    Order Specific Question:  What is the patient's sedation requirement?    Answer:  No Sedation  . MR Breast Bilateral Wo Contrast    Standing Status: Future     Number of Occurrences:      Standing Expiration Date: 12/12/2015    Order Specific Question:  Reason for Exam (SYMPTOM  OR DIAGNOSIS REQUIRED)    Answer:  Post neoadjuvant chemo breast cancer    Order Specific Question:  Preferred imaging location?    Answer:  [facility]    Order Specific Question:  Does the patient have a pacemaker or implanted devices?    Answer:  No    Order Specific Question:  What is the patient's sedation requirement?    Answer:  No Sedation  . CBC with Differential    Standing Status: Future     Number of Occurrences:      Standing Expiration Date: 12/12/2015  . Comprehensive metabolic panel (Cmet) - [facility]    Standing Status: Future     Number of Occurrences:      Standing Expiration Date: 12/12/2015  . 2D Echocardiogram without contrast    Standing Status: Future     Number of Occurrences:      Standing Expiration Date: 12/12/2015    Scheduling Instructions:     PROVIDERS If this is NOT for EF then please REASON that  needs COMPLETE STUDY    Order Specific Question:  Type of Echo    Answer:  Complete    Order Specific Question:  Reason for Exam    Answer:  On Herceptin evaluation    Order Specific Question:  Where should this test be performed    Answer:  [facility]   The patient has a good understanding of the overall plan. she agrees with it. She will call with any problems that may develop before her next visit here.   Rulon Eisenmenger, MD

## 2014-12-12 NOTE — Patient Instructions (Signed)
[facility] Discharge Instructions for Patients Receiving Chemotherapy  Today you received the following chemotherapy agents; Herceptin, Perjeta, Taxotere and Carboplatin.   To help prevent nausea and vomiting after your treatment, we encourage you to take your nausea medication as directed.    If you develop nausea and vomiting that is not controlled by your nausea medication, call the clinic.   BELOW ARE SYMPTOMS THAT SHOULD BE REPORTED IMMEDIATELY:  *FEVER GREATER THAN 100.5 F  *CHILLS WITH OR WITHOUT FEVER  NAUSEA AND VOMITING THAT IS NOT CONTROLLED WITH YOUR NAUSEA MEDICATION  *UNUSUAL SHORTNESS OF BREATH  *UNUSUAL BRUISING OR BLEEDING  TENDERNESS IN MOUTH AND THROAT WITH OR WITHOUT PRESENCE OF ULCERS  *URINARY PROBLEMS  *BOWEL PROBLEMS  UNUSUAL RASH Items with * indicate a potential emergency and should be followed up as soon as possible.  Feel free to call the clinic you have any questions or concerns. The clinic phone number is [phone].

## 2014-12-12 NOTE — Assessment & Plan Note (Addendum)
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   Current treatment: Today is cycle 5 day 1 of neoadjuvant chemotherapy with Taxotere, carboplatin, Herceptin and Perjeta given once every 3 weeks. Tolerated cycle 4 of chemotherapy much better. Cardiac monitoring: echocardiogram October 2015 EF 55% Chemotherapy related toxicities: Patient experienced the following toxicities 1. Fatigue due to chemotherapy: Starts after she stops the Decadron treatment and lasts 4-5 days 2. Sore throat: resolved 3. Low-grade temperature: also resolved 4. Alopecia 5. Tingling and numbness lasted for 3-4 days after chemotherapy and then resolved. 6. Nausea and vomiting after cycle 3 lasted 24 hours improved with round-the-clock antiemetics 7. Scalp hair folliculitis: Not bothering the patient. Will watch it for now 8. Chemotherapy induced anemia grade 1 being monitored hemoglobin 10.6 Denies any diarrhea I reviewed her blood counts are adequate for treatment  Return to clinic in 3 weeks for cycle 6 of chemotherapy.  Plan: 1.  Following cycle 6 of chemotherapy, we will arrange for breast MRI 2.  Presentation at tumor board and surgery referral. 3.  Continue with Herceptin maintenance every 3 weeks

## 2014-12-15 ENCOUNTER — Ambulatory Visit (HOSPITAL_BASED_OUTPATIENT_CLINIC_OR_DEPARTMENT_OTHER): Payer: Managed Care, Other (non HMO)

## 2014-12-15 DIAGNOSIS — Z5189 Encounter for other specified aftercare: Secondary | ICD-10-CM

## 2014-12-15 DIAGNOSIS — C50412 Malignant neoplasm of upper-outer quadrant of left female breast: Secondary | ICD-10-CM

## 2014-12-15 MED ORDER — PEGFILGRASTIM INJECTION 6 MG/0.6ML ~~LOC~~
6.0000 mg | PREFILLED_SYRINGE | Freq: Once | SUBCUTANEOUS | Status: AC
  Administered 2014-12-15: 6 mg via SUBCUTANEOUS
  Filled 2014-12-15: qty 0.6

## 2014-12-21 ENCOUNTER — Other Ambulatory Visit: Payer: Self-pay | Admitting: Hematology and Oncology

## 2014-12-22 ENCOUNTER — Other Ambulatory Visit: Payer: Self-pay | Admitting: *Deleted

## 2014-12-22 DIAGNOSIS — C50412 Malignant neoplasm of upper-outer quadrant of left female breast: Secondary | ICD-10-CM

## 2014-12-22 MED ORDER — ONDANSETRON HCL 8 MG PO TABS: 8.0000 mg | ORAL_TABLET | Freq: Two times a day (BID) | ORAL | Status: DC

## 2015-01-02 ENCOUNTER — Ambulatory Visit (HOSPITAL_BASED_OUTPATIENT_CLINIC_OR_DEPARTMENT_OTHER): Payer: Managed Care, Other (non HMO)

## 2015-01-02 ENCOUNTER — Other Ambulatory Visit (HOSPITAL_BASED_OUTPATIENT_CLINIC_OR_DEPARTMENT_OTHER): Payer: Managed Care, Other (non HMO)

## 2015-01-02 ENCOUNTER — Telehealth: Payer: Self-pay | Admitting: *Deleted

## 2015-01-02 ENCOUNTER — Ambulatory Visit (HOSPITAL_BASED_OUTPATIENT_CLINIC_OR_DEPARTMENT_OTHER): Payer: Managed Care, Other (non HMO) | Admitting: Hematology and Oncology

## 2015-01-02 ENCOUNTER — Telehealth: Payer: Self-pay | Admitting: Hematology and Oncology

## 2015-01-02 ENCOUNTER — Other Ambulatory Visit: Payer: Managed Care, Other (non HMO)

## 2015-01-02 VITALS — BP 116/75 | HR 73 | Temp 98.2°F | Resp 18 | Ht 67.0 in | Wt 145.7 lb

## 2015-01-02 DIAGNOSIS — Z5111 Encounter for antineoplastic chemotherapy: Secondary | ICD-10-CM

## 2015-01-02 DIAGNOSIS — R530 Neoplastic (malignant) related fatigue: Secondary | ICD-10-CM

## 2015-01-02 DIAGNOSIS — D6481 Anemia due to antineoplastic chemotherapy: Secondary | ICD-10-CM

## 2015-01-02 DIAGNOSIS — C50412 Malignant neoplasm of upper-outer quadrant of left female breast: Secondary | ICD-10-CM

## 2015-01-02 DIAGNOSIS — L658 Other specified nonscarring hair loss: Secondary | ICD-10-CM

## 2015-01-02 DIAGNOSIS — Z5112 Encounter for antineoplastic immunotherapy: Secondary | ICD-10-CM

## 2015-01-02 LAB — COMPREHENSIVE METABOLIC PANEL (CC13)
ALT: 14 U/L (ref 0–55)
AST: 13 U/L (ref 5–34)
Albumin: 3.9 g/dL (ref 3.5–5.0)
Alkaline Phosphatase: 68 U/L (ref 40–150)
Anion Gap: 8 mEq/L (ref 3–11)
BUN: 6.3 mg/dL — ABNORMAL LOW (ref 7.0–26.0)
CO2: 24 mEq/L (ref 22–29)
Calcium: 9.6 mg/dL (ref 8.4–10.4)
Chloride: 110 mEq/L — ABNORMAL HIGH (ref 98–109)
Creatinine: 0.7 mg/dL (ref 0.6–1.1)
EGFR: >90 mL/min/{1.73_m2} (ref >90)
Glucose: 123 mg/dl (ref 70–140)
Potassium: 4.3 mEq/L (ref 3.5–5.1)
Sodium: 143 mEq/L (ref 136–145)
Total Bilirubin: 0.24 mg/dL (ref 0.20–1.20)
Total Protein: 6.5 g/dL (ref 6.4–8.3)

## 2015-01-02 LAB — CBC WITH DIFFERENTIAL/PLATELET
BASO%: 0 % (ref 0.0–2.0)
Basophils Absolute: 0 10*3/uL (ref 0.0–0.1)
EOS%: 0 % (ref 0.0–7.0)
Eosinophils Absolute: 0 10*3/uL (ref 0.0–0.5)
HCT: 29.2 % — ABNORMAL LOW (ref 34.8–46.6)
HGB: 9.6 g/dL — ABNORMAL LOW (ref 11.6–15.9)
LYMPH%: 9.3 % — ABNORMAL LOW (ref 14.0–49.7)
MCH: 32.8 pg (ref 25.1–34.0)
MCHC: 32.9 g/dL (ref 31.5–36.0)
MCV: 99.7 fL (ref 79.5–101.0)
MONO#: 0.4 10*3/uL (ref 0.1–0.9)
MONO%: 7.5 % (ref 0.0–14.0)
NEUT#: 4.6 10*3/uL (ref 1.5–6.5)
NEUT%: 83.2 % — ABNORMAL HIGH (ref 38.4–76.8)
Platelets: 174 10*3/uL (ref 145–400)
RBC: 2.93 10*6/uL — ABNORMAL LOW (ref 3.70–5.45)
RDW: 15.5 % — ABNORMAL HIGH (ref 11.2–14.5)
WBC: 5.5 10*3/uL (ref 3.9–10.3)
lymph#: 0.5 10*3/uL — ABNORMAL LOW (ref 0.9–3.3)
nRBC: 0 % (ref 0–0)

## 2015-01-02 MED ORDER — SODIUM CHLORIDE 0.9 % IJ SOLN
10.0000 mL | INTRAMUSCULAR | Status: DC | PRN
  Administered 2015-01-02: 10 mL
  Filled 2015-01-02: qty 10

## 2015-01-02 MED ORDER — DOCETAXEL CHEMO INJECTION 160 MG/16ML
75.0000 mg/m2 | Freq: Once | INTRAVENOUS | Status: AC
  Administered 2015-01-02: 130 mg via INTRAVENOUS
  Filled 2015-01-02: qty 13

## 2015-01-02 MED ORDER — TRASTUZUMAB CHEMO INJECTION 440 MG
6.0000 mg/kg | Freq: Once | INTRAVENOUS | Status: AC
  Administered 2015-01-02: 378 mg via INTRAVENOUS
  Filled 2015-01-02: qty 18

## 2015-01-02 MED ORDER — DEXAMETHASONE SODIUM PHOSPHATE 20 MG/5ML IJ SOLN
20.0000 mg | Freq: Once | INTRAMUSCULAR | Status: AC
  Administered 2015-01-02: 20 mg via INTRAVENOUS

## 2015-01-02 MED ORDER — CARBOPLATIN CHEMO INJECTION 600 MG/60ML
714.0000 mg | Freq: Once | INTRAVENOUS | Status: AC
  Administered 2015-01-02: 710 mg via INTRAVENOUS
  Filled 2015-01-02: qty 71

## 2015-01-02 MED ORDER — DIPHENHYDRAMINE HCL 25 MG PO CAPS
50.0000 mg | ORAL_CAPSULE | Freq: Once | ORAL | Status: AC
  Administered 2015-01-02: 50 mg via ORAL

## 2015-01-02 MED ORDER — PERTUZUMAB CHEMO INJECTION 420 MG/14ML
420.0000 mg | Freq: Once | INTRAVENOUS | Status: AC
  Administered 2015-01-02: 420 mg via INTRAVENOUS
  Filled 2015-01-02: qty 14

## 2015-01-02 MED ORDER — ONDANSETRON 16 MG/50ML IVPB (CHCC)
16.0000 mg | Freq: Once | INTRAVENOUS | Status: AC
  Administered 2015-01-02: 16 mg via INTRAVENOUS

## 2015-01-02 MED ORDER — ACETAMINOPHEN 325 MG PO TABS
650.0000 mg | ORAL_TABLET | Freq: Once | ORAL | Status: AC
  Administered 2015-01-02: 650 mg via ORAL

## 2015-01-02 MED ORDER — HEPARIN SOD (PORK) LOCK FLUSH 100 UNIT/ML IV SOLN
500.0000 [IU] | Freq: Once | INTRAVENOUS | Status: AC | PRN
  Administered 2015-01-02: 500 [IU]
  Filled 2015-01-02: qty 5

## 2015-01-02 MED ORDER — SODIUM CHLORIDE 0.9 % IV SOLN
Freq: Once | INTRAVENOUS | Status: AC
  Administered 2015-01-02: 09:00:00 via INTRAVENOUS

## 2015-01-02 NOTE — Telephone Encounter (Signed)
per pof to sch ECHO-per [facility] [employer]-cld & spoke to pt amd gave pt time & dtae of appt-pt understood

## 2015-01-02 NOTE — Telephone Encounter (Signed)
Per staff message and POF I have scheduled appts. Advised scheduler of appts. JMW  

## 2015-01-02 NOTE — Progress Notes (Signed)
Patient Care Team: Thurnell Lose, MD as PCP - General (Obstetrics and Gynecology) Thurnell Lose, MD (Obstetrics and Gynecology) Erroll Luna, MD as Consulting Physician (General Surgery) Rulon Eisenmenger, MD as Consulting Physician (Hematology and Oncology) Thea Silversmith, MD as Consulting Physician (Radiation Oncology) Erroll Luna, MD as Consulting Physician (General Surgery) Rulon Eisenmenger, MD as Consulting Physician (Hematology and Oncology) Thea Silversmith, MD as Consulting Physician (Radiation Oncology)  DIAGNOSIS: Breast cancer of upper-outer quadrant of left female breast   Staging form: Breast, AJCC 7th Edition     Clinical: Stage IIA (T2, N0, cM0) - Unsigned       Staging comments: Staged at breast conference on 10.21.15      Pathologic: No stage assigned - Unsigned   SUMMARY OF ONCOLOGIC HISTORY:   Breast cancer of upper-outer quadrant of left female breast   08/20/2014 Initial Diagnosis Invasive ductal carcinoma grade 3, ER PR positive HER-2 positive ratio 3.5, Ki-67 25%; biopsy of satellite lesion fibroadenoma   08/26/2014 Breast MRI Left breast: Upper-outer quadrant 1.4 cm from nipple 1.9 x 2.5 x 2.6 cm enhancing mass with the 8 mm nodule 1 cm posterior Right breast: 1.4 x 1.6 cm mass upper outer quadrant biopsy proven fibroadenoma:   09/19/2014 -  Neo-Adjuvant Chemotherapy Neoadjuvant chemotherapy with Taxotere, carboplatin, Herceptin and Perjeta x6 cycles    CHIEF COMPLIANT: Cycle 6 TCH Perjeta  INTERVAL HISTORY: Kim Armstrong is a 39 year old with above-mentioned history of left breast cancer treated with neoadjuvant chemotherapy today is cycle 6. She has tolerated cycle 5 very well. She has neuropathy for 3-4 days after chemotherapy but then it goes away. Denies any nausea vomiting or diarrhea. Other than mild fatigue for a few days after chemotherapy she has been doing very well.  REVIEW OF SYSTEMS:   Constitutional: Denies fevers, chills or abnormal weight  loss Eyes: Denies blurriness of vision Ears, nose, mouth, throat, and face: Denies mucositis or sore throat Respiratory: Denies cough, dyspnea or wheezes Cardiovascular: Denies palpitation, chest discomfort or lower extremity swelling Gastrointestinal:  Denies nausea, heartburn or change in bowel habits Skin: Denies abnormal skin rashes Lymphatics: Denies new lymphadenopathy or easy bruising Neurological:Denies numbness, tingling or new weaknesses Behavioral/Psych: Mood is stable, no new changes  All other systems were reviewed with the patient and are negative.  I have reviewed the past medical history, past surgical history, social history and family history with the patient and they are unchanged from previous note.  ALLERGIES:  has No Known Allergies.  MEDICATIONS:  Current Outpatient Prescriptions  Medication Sig Dispense Refill  . acetaminophen (TYLENOL) 500 MG tablet Take 1,000 mg by mouth every 6 (six) hours as needed for mild pain or moderate pain.    Marland Kitchen ALPRAZolam (XANAX) 0.25 MG tablet Take 0.25 mg by mouth at bedtime as needed for anxiety (and for flying).     Marland Kitchen dexamethasone (DECADRON) 4 MG tablet TAKE TWO TABLETS BY MOUTH TWICE DAILY. START THE DAY BEFORE TAXOTERE. THEN AGAIN THE DAY AFTER CHEMO FOR 3 DAYS  30 tablet 0  . diphenhydramine-acetaminophen (TYLENOL PM) 25-500 MG TABS Take 1 tablet by mouth at bedtime as needed.    . docusate sodium (COLACE) 100 MG capsule Take 100 mg by mouth 2 (two) times daily.    Marland Kitchen isometheptene-acetaminophen-dichloralphenazone (MIDRIN) 65-325-100 MG capsule Take 1-2 capsules by mouth 4 (four) times daily as needed for migraine.     . lidocaine-prilocaine (EMLA) cream Apply to affected area once 30 g 3  . lidocaine-prilocaine (EMLA) cream Apply  to affected area once 30 g 3  . LORazepam (ATIVAN) 0.5 MG tablet Take 1 tablet (0.5 mg total) by mouth every 6 (six) hours as needed (Nausea or vomiting). 30 tablet 0  . ondansetron (ZOFRAN) 8 MG tablet  Take 1 tablet (8 mg total) by mouth 2 (two) times daily. Start the day after chemo for 3 days. Then take as needed for nausea or vomiting. 30 tablet 2  . prochlorperazine (COMPAZINE) 10 MG tablet Take 1 tablet (10 mg total) by mouth every 6 (six) hours as needed (Nausea or vomiting). 30 tablet 1  . tranexamic acid (LYSTEDA) 650 MG TABS tablet Take 1,300 mg by mouth 3 (three) times daily as needed (heavy menstrual cycle).      No current facility-administered medications for this visit.    PHYSICAL EXAMINATION: ECOG PERFORMANCE STATUS: 1 - Symptomatic but completely ambulatory  Filed Vitals:   01/02/15 0836  BP: 116/75  Pulse: 73  Temp: 98.2 F (36.8 C)  Resp: 18   Filed Weights   01/02/15 0836  Weight: 145 lb 11.2 oz (66.089 kg)    GENERAL:alert, no distress and comfortable SKIN: skin color, texture, turgor are normal, no rashes or significant lesions EYES: normal, Conjunctiva are pink and non-injected, sclera clear OROPHARYNX:no exudate, no erythema and lips, buccal mucosa, and tongue normal  NECK: supple, thyroid normal size, non-tender, without nodularity LYMPH:  no palpable lymphadenopathy in the cervical, axillary or inguinal LUNGS: clear to auscultation and percussion with normal breathing effort HEART: regular rate & rhythm and no murmurs and no lower extremity edema ABDOMEN:abdomen soft, non-tender and normal bowel sounds Musculoskeletal:no cyanosis of digits and no clubbing  NEURO: alert & oriented x 3 with fluent speech, no focal motor/sensory deficits  LABORATORY DATA:  I have reviewed the data as listed   Chemistry      Component Value Date/Time   NA 140 12/12/2014 0845   K 4.2 12/12/2014 0845   CO2 24 12/12/2014 0845   BUN 13.3 12/12/2014 0845   CREATININE 0.7 12/12/2014 0845      Component Value Date/Time   CALCIUM 9.6 12/12/2014 0845   ALKPHOS 80 12/12/2014 0845   AST 16 12/12/2014 0845   ALT 22 12/12/2014 0845   BILITOT 0.27 12/12/2014 0845        Lab Results  Component Value Date   WBC 5.5 01/02/2015   HGB 9.6* 01/02/2015   HCT 29.2* 01/02/2015   MCV 99.7 01/02/2015   PLT 174 01/02/2015   NEUTROABS 4.6 01/02/2015   ASSESSMENT & PLAN:  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   Current treatment: Today is cycle 6 day 1 of neoadjuvant chemotherapy with Taxotere, carboplatin, Herceptin and Perjeta given once every 3 weeks.  Cardiac monitoring: echocardiogram October 2015 EF 55%  Chemotherapy related toxicities: Patient experienced the following toxicities 1. Fatigue due to chemotherapy: Starts after she stops the Decadron treatment and lasts 4-5 days 2. Sore throat: resolved 3. Low-grade temperature: also resolved 4. Alopecia 5. Neuropathy: Tingling and numbness lasted for 3-4 days after chemotherapy and then resolved. 6. Nausea and vomiting after cycle 3 lasted 24 hours improved with round-the-clock antiemetics 7. Scalp hair folliculitis: Not bothering the patient. Will watch it. 8. Chemotherapy induced anemia grade 1 being monitored hemoglobin 9.6 today Denies any diarrhea I reviewed her blood counts are  adequate for treatment  Return to clinic in 3 weeks for Herceptin Maintenance.   Plan: 1. Following this cycle of chemotherapy, we will arrange for breast MRI (01/09/15) 2. Presentation at tumor board and surgery referral. Patient is thinking about going to [facility] for an oncoplastic surgeon. She will keep [place] posted regarding her decision regarding surgeons. I instructed her that she should find a surgeon soon so that her surgery can be arranged approximately 1 month after completion of chemotherapy. 3. Continue with Herceptin maintenance every 3 weeks I will see her back on March 9 to go over the MRI results.  No orders of the  defined types were placed in this encounter.   The patient has a good understanding of the overall plan. she agrees with it. She will call with any problems that may develop before her next visit here.   Rulon Eisenmenger, MD

## 2015-01-02 NOTE — Patient Instructions (Signed)
[facility] [place] Discharge Instructions for Patients Receiving Chemotherapy  Today you received the following chemotherapy agents:  Taxotere, Carboplatin, Herceptin, Perjeta  To help prevent nausea and vomiting after your treatment, we encourage you to take your nausea medication: compazine 10 mg every 6 hours as needed, Zofran 8 mg every 8 hours as needed.   If you develop nausea and vomiting that is not controlled by your nausea medication, call the clinic.   BELOW ARE SYMPTOMS THAT SHOULD BE REPORTED IMMEDIATELY:  *FEVER GREATER THAN 100.5 F  *CHILLS WITH OR WITHOUT FEVER  NAUSEA AND VOMITING THAT IS NOT CONTROLLED WITH YOUR NAUSEA MEDICATION  *UNUSUAL SHORTNESS OF BREATH  *UNUSUAL BRUISING OR BLEEDING  TENDERNESS IN MOUTH AND THROAT WITH OR WITHOUT PRESENCE OF ULCERS  *URINARY PROBLEMS  *BOWEL PROBLEMS  UNUSUAL RASH Items with * indicate a potential emergency and should be followed up as soon as possible.  Feel free to call the clinic you have any questions or concerns. The clinic phone number is [phone].

## 2015-01-02 NOTE — Assessment & Plan Note (Addendum)
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   Current treatment: Today is cycle 6 day 1 of neoadjuvant chemotherapy with Taxotere, carboplatin, Herceptin and Perjeta given once every 3 weeks.  Cardiac monitoring: echocardiogram October 2015 EF 55%  Chemotherapy related toxicities: Patient experienced the following toxicities 1. Fatigue due to chemotherapy: Starts after she stops the Decadron treatment and lasts 4-5 days 2. Sore throat: resolved 3. Low-grade temperature: also resolved 4. Alopecia 5. Neuropathy: Tingling and numbness lasted for 3-4 days after chemotherapy and then resolved. 6. Nausea and vomiting after cycle 3 lasted 24 hours improved with round-the-clock antiemetics 7. Scalp hair folliculitis: Not bothering the patient. Will watch it. 8. Chemotherapy induced anemia grade 1 being monitored hemoglobin 10.6 Denies any diarrhea I reviewed her blood counts are adequate for treatment  Return to clinic in 3 weeks for Herceptin Maintenance.   Plan: 1. Following this cycle of chemotherapy, we will arrange for breast MRI (01/09/15) 2. Presentation at tumor board and surgery referral. Patient is thinking about going to [facility] for an oncoplastic surgeon. She will keep [place] posted regarding her decision regarding surgeons. I instructed her that she should find a surgeon soon so that her surgery can be arranged approximately 1 month after completion of chemotherapy. 3. Continue with Herceptin maintenance every 3 weeks I will see her back on March 9 to go over the MRI results.

## 2015-01-02 NOTE — Telephone Encounter (Signed)
per pfo tosch pt appt-sent MW emailto sch pt trmt-sent linda email to pre cert for ECHO-pt in trmt room to get updated copy b4 leaving-will sch ECHO after reply

## 2015-01-03 ENCOUNTER — Ambulatory Visit (HOSPITAL_BASED_OUTPATIENT_CLINIC_OR_DEPARTMENT_OTHER): Payer: Managed Care, Other (non HMO)

## 2015-01-03 DIAGNOSIS — Z5189 Encounter for other specified aftercare: Secondary | ICD-10-CM

## 2015-01-03 DIAGNOSIS — C50412 Malignant neoplasm of upper-outer quadrant of left female breast: Secondary | ICD-10-CM

## 2015-01-03 MED ORDER — PEGFILGRASTIM INJECTION 6 MG/0.6ML ~~LOC~~
6.0000 mg | PREFILLED_SYRINGE | Freq: Once | SUBCUTANEOUS | Status: AC
  Administered 2015-01-03: 6 mg via SUBCUTANEOUS

## 2015-01-03 NOTE — Patient Instructions (Signed)
Pegfilgrastim injection What is this medicine? PEGFILGRASTIM (peg fil GRA stim) is a long-acting granulocyte colony-stimulating factor that stimulates the growth of neutrophils, a type of white blood cell important in the body's fight against infection. It is used to reduce the incidence of fever and infection in patients with certain types of cancer who are receiving chemotherapy that affects the bone marrow. This medicine may be used for other purposes; ask your health care provider or pharmacist if you have questions. COMMON BRAND NAME(S): Neulasta What should I tell my health care provider before I take this medicine? They need to know if you have any of these conditions: -latex allergy -ongoing radiation therapy -sickle cell disease -skin reactions to acrylic adhesives (On-Body Injector only) -an unusual or allergic reaction to pegfilgrastim, filgrastim, other medicines, foods, dyes, or preservatives -pregnant or trying to get pregnant -breast-feeding How should I use this medicine? This medicine is for injection under the skin. If you get this medicine at home, you will be taught how to prepare and give the pre-filled syringe or how to use the On-body Injector. Refer to the patient Instructions for Use for detailed instructions. Use exactly as directed. Take your medicine at regular intervals. Do not take your medicine more often than directed. It is important that you put your used needles and syringes in a special sharps container. Do not put them in a trash can. If you do not have a sharps container, call your pharmacist or healthcare provider to get one. Talk to your pediatrician regarding the use of this medicine in children. Special care may be needed. Overdosage: If you think you have taken too much of this medicine contact a poison control center or emergency room at once. NOTE: This medicine is only for you. Do not share this medicine with others. What if I miss a dose? It is  important not to miss your dose. Call your doctor or health care professional if you miss your dose. If you miss a dose due to an On-body Injector failure or leakage, a new dose should be administered as soon as possible using a single prefilled syringe for manual use. What may interact with this medicine? Interactions have not been studied. Give your health care provider a list of all the medicines, herbs, non-prescription drugs, or dietary supplements you use. Also tell them if you smoke, drink alcohol, or use illegal drugs. Some items may interact with your medicine. This list may not describe all possible interactions. Give your health care provider a list of all the medicines, herbs, non-prescription drugs, or dietary supplements you use. Also tell them if you smoke, drink alcohol, or use illegal drugs. Some items may interact with your medicine. What should I watch for while using this medicine? You may need blood work done while you are taking this medicine. If you are going to need a MRI, CT scan, or other procedure, tell your doctor that you are using this medicine (On-Body Injector only). What side effects may I notice from receiving this medicine? Side effects that you should report to your doctor or health care professional as soon as possible: -allergic reactions like skin rash, itching or hives, swelling of the face, lips, or tongue -dizziness -fever -pain, redness, or irritation at site where injected -pinpoint red spots on the skin -shortness of breath or breathing problems -stomach or side pain, or pain at the shoulder -swelling -tiredness -trouble passing urine Side effects that usually do not require medical attention (report to your doctor   or health care professional if they continue or are bothersome): -bone pain -muscle pain This list may not describe all possible side effects. Call your doctor for medical advice about side effects. You may report side effects to FDA at  1-800-FDA-1088. Where should I keep my medicine? Keep out of the reach of children. Store pre-filled syringes in a refrigerator between 2 and 8 degrees C (36 and 46 degrees F). Do not freeze. Keep in carton to protect from light. Throw away this medicine if it is left out of the refrigerator for more than 48 hours. Throw away any unused medicine after the expiration date. NOTE: This sheet is a summary. It may not cover all possible information. If you have questions about this medicine, talk to your doctor, pharmacist, or health care provider.  2015, [employer]. (2014-01-23 16:14:05)  

## 2015-01-06 ENCOUNTER — Ambulatory Visit (HOSPITAL_COMMUNITY)
Admission: RE | Admit: 2015-01-06 | Discharge: 2015-01-06 | Disposition: A | Discharge status: Home / Self Care | Payer: Managed Care, Other (non HMO) | Source: Ambulatory Visit | Attending: Hematology and Oncology | Admitting: Hematology and Oncology

## 2015-01-06 DIAGNOSIS — Z08 Encounter for follow-up examination after completed treatment for malignant neoplasm: Secondary | ICD-10-CM

## 2015-01-06 DIAGNOSIS — C50412 Malignant neoplasm of upper-outer quadrant of left female breast: Secondary | ICD-10-CM | POA: Diagnosis present

## 2015-01-06 DIAGNOSIS — C50919 Malignant neoplasm of unspecified site of unspecified female breast: Secondary | ICD-10-CM

## 2015-01-06 NOTE — Progress Notes (Signed)
Echocardiogram 2D Echocardiogram has been performed.  Kim Armstrong 01/06/2015, 10:36 AM

## 2015-01-09 ENCOUNTER — Other Ambulatory Visit: Payer: Self-pay | Admitting: Hematology and Oncology

## 2015-01-09 ENCOUNTER — Ambulatory Visit (HOSPITAL_COMMUNITY)
Admission: RE | Admit: 2015-01-09 | Discharge: 2015-01-09 | Disposition: A | Discharge status: Home / Self Care | Payer: Managed Care, Other (non HMO) | Source: Ambulatory Visit | Attending: Hematology and Oncology | Admitting: Hematology and Oncology

## 2015-01-09 DIAGNOSIS — C50412 Malignant neoplasm of upper-outer quadrant of left female breast: Secondary | ICD-10-CM | POA: Insufficient documentation

## 2015-01-09 DIAGNOSIS — Z803 Family history of malignant neoplasm of breast: Secondary | ICD-10-CM | POA: Diagnosis not present

## 2015-01-09 MED ORDER — GADOBENATE DIMEGLUMINE 529 MG/ML IV SOLN
13.0000 mL | Freq: Once | INTRAVENOUS | Status: AC | PRN
  Administered 2015-01-09: 13 mL via INTRAVENOUS

## 2015-01-14 ENCOUNTER — Ambulatory Visit (HOSPITAL_BASED_OUTPATIENT_CLINIC_OR_DEPARTMENT_OTHER): Payer: Managed Care, Other (non HMO) | Admitting: Hematology and Oncology

## 2015-01-14 VITALS — BP 107/66 | HR 82 | Temp 98.5°F | Resp 18 | Ht 67.0 in | Wt 147.0 lb

## 2015-01-14 DIAGNOSIS — F329 Major depressive disorder, single episode, unspecified: Secondary | ICD-10-CM

## 2015-01-14 DIAGNOSIS — C50412 Malignant neoplasm of upper-outer quadrant of left female breast: Secondary | ICD-10-CM

## 2015-01-14 DIAGNOSIS — Z170 Estrogen receptor positive status [ER+]: Secondary | ICD-10-CM

## 2015-01-14 MED ORDER — VENLAFAXINE HCL ER 37.5 MG PO CP24: 37.5000 mg | ORAL_CAPSULE | Freq: Every day | ORAL | Status: DC

## 2015-01-14 NOTE — Assessment & Plan Note (Addendum)
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   Treatment summary: Neoadjuvant chemotherapy with Taxotere, carboplatin, Herceptin and Perjeta given once every 3 weeks from 09/19/2014 to 01/02/2015 .  Cardiac monitoring: echocardiogram October 2015 EF 55%; 01/06/2015 EF 60-65%  Chemotherapy related toxicities: Fatigue, alopecia, grade 1 neuropathy, nausea and vomiting grade 1, scalp hair folliculitis, grade 1 anemia.  MRI review: I discussed MRI report in great detail showing a significant response to chemotherapy from 2.6 cm down to 0.9 cm and the satellite nodule appeared to be not very visible. We presented her case in the multidisciplinary tumor board and the recommendation would be to evaluate her for lumpectomy and sentinel lymph node biopsy.  Plan: 1. Surgery with lumpectomy 2. Continue Herceptin every 3 weeks 3. After surgery: radiation therapy 4. After radiation: Anti-estrogen therapy with tamoxifen/AI +/- ovarian suppression  Return to clinic every 3 weeks for Herceptin maintenance I will see her back with every other Herceptin treatment. I would like to see her back after surgery to discuss final pathology.

## 2015-01-14 NOTE — Progress Notes (Signed)
Patient Care Team: Thurnell Lose, MD as PCP - General (Obstetrics and Gynecology) Thurnell Lose, MD (Obstetrics and Gynecology) Erroll Luna, MD as Consulting Physician (General Surgery) Nicholas Lose, MD as Consulting Physician (Hematology and Oncology) Thea Silversmith, MD as Consulting Physician (Radiation Oncology) Erroll Luna, MD as Consulting Physician (General Surgery) Nicholas Lose, MD as Consulting Physician (Hematology and Oncology) Thea Silversmith, MD as Consulting Physician (Radiation Oncology)  DIAGNOSIS: Breast cancer of upper-outer quadrant of left female breast   Staging form: Breast, AJCC 7th Edition     Clinical: Stage IIA (T2, N0, cM0) - Unsigned       Staging comments: Staged at breast conference on 10.21.15      Pathologic: No stage assigned - Unsigned   SUMMARY OF ONCOLOGIC HISTORY:   Breast cancer of upper-outer quadrant of left female breast   08/20/2014 Initial Diagnosis Invasive ductal carcinoma grade 3, ER PR positive HER-2 positive ratio 3.5, Ki-67 25%; biopsy of satellite lesion fibroadenoma   08/26/2014 Breast MRI Left breast: Upper-outer quadrant 1.4 cm from nipple 1.9 x 2.5 x 2.6 cm enhancing mass with the 8 mm nodule 1 cm posterior Right breast: 1.4 x 1.6 cm mass upper outer quadrant biopsy proven fibroadenoma:   09/19/2014 -  Neo-Adjuvant Chemotherapy Neoadjuvant chemotherapy with Taxotere, carboplatin, Herceptin and Perjeta x6 cycles   01/09/2015 Breast MRI Interval marked response to chemotherapy with significant decrease in the size of the tumor, satellite nodule is now vaguely visible, 2.6 cm mass is now 0.9 cm    CHIEF COMPLIANT: Patient here to discuss breast MRI result after neoadjuvant chemotherapy  INTERVAL HISTORY: Kim Armstrong is a 39 year old with above-mentioned history of left breast cancer ER/PR positive HER-2 positive. Completed neoadjuvant chemotherapy and had a breast MRI and she is here today to discuss the MRI results. We  presented her case in the multidisciplinary tumor board. The tumor had decreased in size from 2.6cm to 0.9 cm and the satellite nodule is no longer visible. Patient is complaining today of having emotional outbursts and lots of tearfulness and moodiness. She appears to have some underlying borderline depression symptoms as well. She attributes this to ovarian suppression related to chemotherapy check fully agree with.  REVIEW OF SYSTEMS:   Constitutional: Denies fevers, chills or abnormal weight loss Eyes: Denies blurriness of vision Ears, nose, mouth, throat, and face: Denies mucositis or sore throat Respiratory: Denies cough, dyspnea or wheezes Cardiovascular: Denies palpitation, chest discomfort or lower extremity swelling Gastrointestinal:  Denies nausea, heartburn or change in bowel habits Skin: Denies abnormal skin rashes Lymphatics: Denies new lymphadenopathy or easy bruising Neurological:Denies numbness, tingling or new weaknesses Behavioral/Psych: Mood is stable, no new changes  Breast:  denies any pain or lumps or nodules in either breasts All other systems were reviewed with the patient and are negative.  I have reviewed the past medical history, past surgical history, social history and family history with the patient and they are unchanged from previous note.  ALLERGIES:  has No Known Allergies.  MEDICATIONS:  Current Outpatient Prescriptions  Medication Sig Dispense Refill  . acetaminophen (TYLENOL) 500 MG tablet Take 1,000 mg by mouth every 6 (six) hours as needed for mild pain or moderate pain.    Marland Kitchen ALPRAZolam (XANAX) 0.25 MG tablet Take 0.25 mg by mouth at bedtime as needed for anxiety (and for flying).     . diphenhydramine-acetaminophen (TYLENOL PM) 25-500 MG TABS Take 1 tablet by mouth at bedtime as needed.    . docusate sodium (COLACE) 100 MG  capsule Take 100 mg by mouth 2 (two) times daily.    Marland Kitchen isometheptene-acetaminophen-dichloralphenazone (MIDRIN) 65-325-100 MG  capsule Take 1-2 capsules by mouth 4 (four) times daily as needed for migraine.     . tranexamic acid (LYSTEDA) 650 MG TABS tablet Take 1,300 mg by mouth 3 (three) times daily as needed (heavy menstrual cycle).     Marland Kitchen lidocaine-prilocaine (EMLA) cream Apply to affected area once (Patient not taking: Reported on 01/14/2015) 30 g 3  . LORazepam (ATIVAN) 0.5 MG tablet Take 1 tablet (0.5 mg total) by mouth every 6 (six) hours as needed (Nausea or vomiting). (Patient not taking: Reported on 01/14/2015) 30 tablet 0  . venlafaxine XR (EFFEXOR-XR) 37.5 MG 24 hr capsule Take 1 capsule (37.5 mg total) by mouth daily with breakfast. 30 capsule 5   No current facility-administered medications for this visit.    PHYSICAL EXAMINATION: ECOG PERFORMANCE STATUS: 1 - Symptomatic but completely ambulatory  Filed Vitals:   01/14/15 0942  BP: 107/66  Pulse: 82  Temp: 98.5 F (36.9 C)  Resp: 18   Filed Weights   01/14/15 0942  Weight: 147 lb (66.679 kg)    GENERAL:alert, no distress and comfortable SKIN: skin color, texture, turgor are normal, no rashes or significant lesions EYES: normal, Conjunctiva are pink and non-injected, sclera clear OROPHARYNX:no exudate, no erythema and lips, buccal mucosa, and tongue normal  NECK: supple, thyroid normal size, non-tender, without nodularity LYMPH:  no palpable lymphadenopathy in the cervical, axillary or inguinal LUNGS: clear to auscultation and percussion with normal breathing effort HEART: regular rate & rhythm and no murmurs and no lower extremity edema ABDOMEN:abdomen soft, non-tender and normal bowel sounds Musculoskeletal:no cyanosis of digits and no clubbing  NEURO: alert & oriented x 3 with fluent speech, no focal motor/sensory deficits  LABORATORY DATA:  I have reviewed the data as listed   Chemistry      Component Value Date/Time   NA 143 01/02/2015 0824   K 4.3 01/02/2015 0824   CO2 24 01/02/2015 0824   BUN 6.3* 01/02/2015 0824   CREATININE 0.7  01/02/2015 0824      Component Value Date/Time   CALCIUM 9.6 01/02/2015 0824   ALKPHOS 68 01/02/2015 0824   AST 13 01/02/2015 0824   ALT 14 01/02/2015 0824   BILITOT 0.24 01/02/2015 0824       Lab Results  Component Value Date   WBC 5.5 01/02/2015   HGB 9.6* 01/02/2015   HCT 29.2* 01/02/2015   MCV 99.7 01/02/2015   PLT 174 01/02/2015   NEUTROABS 4.6 01/02/2015     RADIOGRAPHIC STUDIES: I have personally reviewed the radiology reports and agreed with their findings. 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   Treatment summary: Neoadjuvant chemotherapy with Taxotere, carboplatin, Herceptin and Perjeta given once every 3 weeks from 09/19/2014 to 01/02/2015 .  Cardiac monitoring: echocardiogram October 2015 EF 55%; 01/06/2015 EF 60-65%  Chemotherapy related toxicities: Fatigue, alopecia, grade 1 neuropathy, nausea and vomiting grade 1, scalp hair folliculitis, grade 1 anemia.  MRI review: I discussed MRI report in great detail showing a significant response to chemotherapy from 2.6 cm down to 0.9 cm and the satellite nodule appeared to be not very visible. We presented her case in the multidisciplinary tumor  board and the recommendation would be to evaluate her for lumpectomy and sentinel lymph node biopsy.  Plan: 1. Surgery with lumpectomy: patient going to see Dr. Ernst Bowler at [facility] for her surgery. She has an appointment today. 2. Continue Herceptin every 3 weeks 3. After surgery: radiation therapy 4. After radiation: Anti-estrogen therapy with tamoxifen/AI +/- ovarian suppression  Depression/emotional outbursts/moodiness: I recommended and started her on Effexor XR 37.5 mg daily today.  Return to clinic every 3 weeks for  Herceptin maintenance I will see her back with every other Herceptin treatment. I would like to see her back after surgery to discuss final pathology.    No orders of the defined types were placed in this encounter.   The patient has a good understanding of the overall plan. she agrees with it. She will call with any problems that may develop before her next visit here.   Rulon Eisenmenger, MD

## 2015-01-20 ENCOUNTER — Telehealth: Payer: Self-pay | Admitting: *Deleted

## 2015-01-20 NOTE — Telephone Encounter (Signed)
Received office notes from [place], sent to scan.

## 2015-01-23 ENCOUNTER — Ambulatory Visit (HOSPITAL_BASED_OUTPATIENT_CLINIC_OR_DEPARTMENT_OTHER): Payer: Managed Care, Other (non HMO)

## 2015-01-23 ENCOUNTER — Other Ambulatory Visit: Payer: Self-pay | Admitting: Hematology and Oncology

## 2015-01-23 ENCOUNTER — Other Ambulatory Visit: Payer: Self-pay | Admitting: Oncology

## 2015-01-23 DIAGNOSIS — C50412 Malignant neoplasm of upper-outer quadrant of left female breast: Secondary | ICD-10-CM

## 2015-01-23 DIAGNOSIS — Z5112 Encounter for antineoplastic immunotherapy: Secondary | ICD-10-CM

## 2015-01-23 MED ORDER — TRASTUZUMAB CHEMO INJECTION 440 MG
6.0000 mg/kg | Freq: Once | INTRAVENOUS | Status: AC
  Administered 2015-01-23: 399 mg via INTRAVENOUS
  Filled 2015-01-23: qty 19

## 2015-01-23 MED ORDER — SODIUM CHLORIDE 0.9 % IV SOLN
Freq: Once | INTRAVENOUS | Status: AC
  Administered 2015-01-23: 14:00:00 via INTRAVENOUS

## 2015-01-23 MED ORDER — SODIUM CHLORIDE 0.9 % IV SOLN: Freq: Once | INTRAVENOUS | Status: DC

## 2015-01-23 MED ORDER — TRASTUZUMAB CHEMO INJECTION 440 MG: 6.0000 mg/kg | Freq: Once | INTRAVENOUS | Status: DC

## 2015-01-23 MED ORDER — ACETAMINOPHEN 325 MG PO TABS
650.0000 mg | ORAL_TABLET | Freq: Once | ORAL | Status: AC
  Administered 2015-01-23: 650 mg via ORAL

## 2015-01-23 MED ORDER — SODIUM CHLORIDE 0.9 % IJ SOLN
10.0000 mL | INTRAMUSCULAR | Status: DC | PRN
  Filled 2015-01-23: qty 10

## 2015-01-23 MED ORDER — HEPARIN SOD (PORK) LOCK FLUSH 100 UNIT/ML IV SOLN
500.0000 [IU] | Freq: Once | INTRAVENOUS | Status: DC | PRN
  Filled 2015-01-23: qty 5

## 2015-01-23 MED ORDER — DIPHENHYDRAMINE HCL 25 MG PO CAPS: 50.0000 mg | ORAL_CAPSULE | Freq: Once | ORAL | Status: DC

## 2015-01-23 MED ORDER — SODIUM CHLORIDE 0.9 % IV SOLN: 420.0000 mg | Freq: Once | INTRAVENOUS | Status: DC

## 2015-01-23 MED ORDER — SODIUM CHLORIDE 0.9 % IJ SOLN
10.0000 mL | INTRAMUSCULAR | Status: DC | PRN
  Administered 2015-01-23: 10 mL
  Filled 2015-01-23: qty 10

## 2015-01-23 MED ORDER — HEPARIN SOD (PORK) LOCK FLUSH 100 UNIT/ML IV SOLN
500.0000 [IU] | Freq: Once | INTRAVENOUS | Status: AC | PRN
  Administered 2015-01-23: 500 [IU]
  Filled 2015-01-23: qty 5

## 2015-01-23 NOTE — Patient Instructions (Signed)
[facility] Discharge Instructions for Patients Receiving Chemotherapy  Today you received the following chemotherapy agents Herceptin  To help prevent nausea and vomiting after your treatment, we encourage you to take your nausea medication    If you develop nausea and vomiting that is not controlled by your nausea medication, call the clinic.   BELOW ARE SYMPTOMS THAT SHOULD BE REPORTED IMMEDIATELY:  *FEVER GREATER THAN 100.5 F  *CHILLS WITH OR WITHOUT FEVER  NAUSEA AND VOMITING THAT IS NOT CONTROLLED WITH YOUR NAUSEA MEDICATION  *UNUSUAL SHORTNESS OF BREATH  *UNUSUAL BRUISING OR BLEEDING  TENDERNESS IN MOUTH AND THROAT WITH OR WITHOUT PRESENCE OF ULCERS  *URINARY PROBLEMS  *BOWEL PROBLEMS  UNUSUAL RASH Items with * indicate a potential emergency and should be followed up as soon as possible.  Feel free to call the clinic you have any questions or concerns. The clinic phone number is [phone].  Please show the CHEMO ALERT CARD at check-in to the Emergency Department and triage nurse.   

## 2015-01-29 HISTORY — PX: BREAST LUMPECTOMY WITH AXILLARY LYMPH NODE BIOPSY: SHX5593

## 2015-02-11 ENCOUNTER — Telehealth: Payer: Self-pay

## 2015-02-11 NOTE — Telephone Encounter (Signed)
Office notes dtd 02/09/15 rcvd from [facility].  Reviewed by Dr Lindi Adie.  Sent to scan.

## 2015-02-12 ENCOUNTER — Telehealth: Payer: Self-pay | Admitting: Hematology and Oncology

## 2015-02-12 ENCOUNTER — Other Ambulatory Visit: Payer: Self-pay | Admitting: *Deleted

## 2015-02-12 DIAGNOSIS — C50412 Malignant neoplasm of upper-outer quadrant of left female breast: Secondary | ICD-10-CM

## 2015-02-12 NOTE — Telephone Encounter (Signed)
S/w pt confirming labs added to schedule per 04/07 POF for 04/08 before MD/chemo.... KJ

## 2015-02-13 ENCOUNTER — Telehealth: Payer: Self-pay | Admitting: Hematology and Oncology

## 2015-02-13 ENCOUNTER — Ambulatory Visit (HOSPITAL_BASED_OUTPATIENT_CLINIC_OR_DEPARTMENT_OTHER): Payer: Managed Care, Other (non HMO) | Admitting: Hematology and Oncology

## 2015-02-13 ENCOUNTER — Ambulatory Visit (HOSPITAL_BASED_OUTPATIENT_CLINIC_OR_DEPARTMENT_OTHER): Payer: Managed Care, Other (non HMO)

## 2015-02-13 ENCOUNTER — Other Ambulatory Visit: Payer: Self-pay

## 2015-02-13 ENCOUNTER — Other Ambulatory Visit (HOSPITAL_BASED_OUTPATIENT_CLINIC_OR_DEPARTMENT_OTHER): Payer: Managed Care, Other (non HMO)

## 2015-02-13 VITALS — BP 112/78 | HR 79 | Temp 98.1°F | Resp 18 | Ht 67.0 in | Wt 139.8 lb

## 2015-02-13 DIAGNOSIS — Z5112 Encounter for antineoplastic immunotherapy: Secondary | ICD-10-CM | POA: Diagnosis not present

## 2015-02-13 DIAGNOSIS — C50412 Malignant neoplasm of upper-outer quadrant of left female breast: Secondary | ICD-10-CM | POA: Diagnosis not present

## 2015-02-13 DIAGNOSIS — R5383 Other fatigue: Secondary | ICD-10-CM

## 2015-02-13 DIAGNOSIS — R110 Nausea: Secondary | ICD-10-CM

## 2015-02-13 DIAGNOSIS — G620 Drug-induced polyneuropathy: Secondary | ICD-10-CM

## 2015-02-13 DIAGNOSIS — Z170 Estrogen receptor positive status [ER+]: Secondary | ICD-10-CM | POA: Diagnosis not present

## 2015-02-13 DIAGNOSIS — D649 Anemia, unspecified: Secondary | ICD-10-CM | POA: Diagnosis not present

## 2015-02-13 LAB — CBC WITH DIFFERENTIAL/PLATELET
BASO%: 1.1 % (ref 0.0–2.0)
Basophils Absolute: 0 10*3/uL (ref 0.0–0.1)
EOS%: 1.8 % (ref 0.0–7.0)
Eosinophils Absolute: 0.1 10*3/uL (ref 0.0–0.5)
HCT: 35.4 % (ref 34.8–46.6)
HGB: 11.7 g/dL (ref 11.6–15.9)
LYMPH%: 38.2 % (ref 14.0–49.7)
MCH: 32.2 pg (ref 25.1–34.0)
MCHC: 32.9 g/dL (ref 31.5–36.0)
MCV: 97.7 fL (ref 79.5–101.0)
MONO#: 0.3 10*3/uL (ref 0.1–0.9)
MONO%: 9.7 % (ref 0.0–14.0)
NEUT#: 1.6 10*3/uL (ref 1.5–6.5)
NEUT%: 49.2 % (ref 38.4–76.8)
Platelets: 192 10*3/uL (ref 145–400)
RBC: 3.62 10*6/uL — ABNORMAL LOW (ref 3.70–5.45)
RDW: 13 % (ref 11.2–14.5)
WBC: 3.2 10*3/uL — ABNORMAL LOW (ref 3.9–10.3)
lymph#: 1.2 10*3/uL (ref 0.9–3.3)

## 2015-02-13 LAB — COMPREHENSIVE METABOLIC PANEL (CC13)
ALT: 11 U/L (ref 0–55)
AST: 15 U/L (ref 5–34)
Albumin: 4.2 g/dL (ref 3.5–5.0)
Alkaline Phosphatase: 57 U/L (ref 40–150)
Anion Gap: 9 mEq/L (ref 3–11)
BUN: 10.9 mg/dL (ref 7.0–26.0)
CO2: 26 mEq/L (ref 22–29)
Calcium: 9.4 mg/dL (ref 8.4–10.4)
Chloride: 107 mEq/L (ref 98–109)
Creatinine: 0.8 mg/dL (ref 0.6–1.1)
EGFR: >90 mL/min/{1.73_m2} (ref >90)
Glucose: 65 mg/dl — ABNORMAL LOW (ref 70–140)
Potassium: 4.5 mEq/L (ref 3.5–5.1)
Sodium: 142 mEq/L (ref 136–145)
Total Bilirubin: 0.26 mg/dL (ref 0.20–1.20)
Total Protein: 6.8 g/dL (ref 6.4–8.3)

## 2015-02-13 MED ORDER — SODIUM CHLORIDE 0.9 % IJ SOLN
10.0000 mL | INTRAMUSCULAR | Status: DC | PRN
  Administered 2015-02-13: 10 mL
  Filled 2015-02-13: qty 10

## 2015-02-13 MED ORDER — SODIUM CHLORIDE 0.9 % IV SOLN
Freq: Once | INTRAVENOUS | Status: AC
  Administered 2015-02-13: 11:00:00 via INTRAVENOUS

## 2015-02-13 MED ORDER — SODIUM CHLORIDE 0.9 % IV SOLN
6.0000 mg/kg | Freq: Once | INTRAVENOUS | Status: AC
  Administered 2015-02-13: 399 mg via INTRAVENOUS
  Filled 2015-02-13: qty 19

## 2015-02-13 MED ORDER — DIPHENHYDRAMINE HCL 25 MG PO CAPS: 50.0000 mg | ORAL_CAPSULE | Freq: Once | ORAL | Status: DC

## 2015-02-13 MED ORDER — ACETAMINOPHEN 325 MG PO TABS
650.0000 mg | ORAL_TABLET | Freq: Once | ORAL | Status: AC
  Administered 2015-02-13: 650 mg via ORAL

## 2015-02-13 MED ORDER — ACETAMINOPHEN 325 MG PO TABS
ORAL_TABLET | ORAL | Status: AC
  Filled 2015-02-13: qty 2

## 2015-02-13 MED ORDER — HEPARIN SOD (PORK) LOCK FLUSH 100 UNIT/ML IV SOLN
500.0000 [IU] | Freq: Once | INTRAVENOUS | Status: AC | PRN
  Administered 2015-02-13: 500 [IU]
  Filled 2015-02-13: qty 5

## 2015-02-13 NOTE — Patient Instructions (Signed)
[facility] Discharge Instructions for Patients Receiving Chemotherapy  Today you received the following chemotherapy agents:  Herceptin  To help prevent nausea and vomiting after your treatment, we encourage you to take your nausea medication as prescribed.   If you develop nausea and vomiting that is not controlled by your nausea medication, call the clinic.   BELOW ARE SYMPTOMS THAT SHOULD BE REPORTED IMMEDIATELY:  *FEVER GREATER THAN 100.5 F  *CHILLS WITH OR WITHOUT FEVER  NAUSEA AND VOMITING THAT IS NOT CONTROLLED WITH YOUR NAUSEA MEDICATION  *UNUSUAL SHORTNESS OF BREATH  *UNUSUAL BRUISING OR BLEEDING  TENDERNESS IN MOUTH AND THROAT WITH OR WITHOUT PRESENCE OF ULCERS  *URINARY PROBLEMS  *BOWEL PROBLEMS  UNUSUAL RASH Items with * indicate a potential emergency and should be followed up as soon as possible.  Feel free to call the clinic you have any questions or concerns. The clinic phone number is [phone].  Please show the CHEMO ALERT CARD at check-in to the Emergency Department and triage nurse.   

## 2015-02-13 NOTE — Telephone Encounter (Signed)
appointments made and avs will be printed in chemo for patient

## 2015-02-13 NOTE — Progress Notes (Signed)
Pathology results dtd 01/29/15 rcvd from [facility].  Reviewed by Dr. Lindi Adie.  Sent to scan.

## 2015-02-13 NOTE — Progress Notes (Signed)
Patient Care Team: Thurnell Lose, MD as PCP - General (Obstetrics and Gynecology) Thurnell Lose, MD (Obstetrics and Gynecology) Erroll Luna, MD as Consulting Physician (General Surgery) Nicholas Lose, MD as Consulting Physician (Hematology and Oncology) Thea Silversmith, MD as Consulting Physician (Radiation Oncology) Erroll Luna, MD as Consulting Physician (General Surgery) Nicholas Lose, MD as Consulting Physician (Hematology and Oncology) Thea Silversmith, MD as Consulting Physician (Radiation Oncology)  DIAGNOSIS: Breast cancer of upper-outer quadrant of left female breast   Staging form: Breast, AJCC 7th Edition     Clinical: Stage IIA (T2, N0, cM0) - Unsigned       Staging comments: Staged at breast conference on 10.21.15      Pathologic: No stage assigned - Unsigned   SUMMARY OF ONCOLOGIC HISTORY:   Breast cancer of upper-outer quadrant of left female breast   08/20/2014 Initial Diagnosis Invasive ductal carcinoma grade 3, ER PR positive HER-2 positive ratio 3.5, Ki-67 25%; biopsy of satellite lesion fibroadenoma   08/26/2014 Breast MRI Left breast: Upper-outer quadrant 1.4 cm from nipple 1.9 x 2.5 x 2.6 cm enhancing mass with the 8 mm nodule 1 cm posterior Right breast: 1.4 x 1.6 cm mass upper outer quadrant biopsy proven fibroadenoma:   09/19/2014 -  Neo-Adjuvant Chemotherapy Neoadjuvant chemotherapy with Taxotere, carboplatin, Herceptin and Perjeta x6 cycles followed by Herceptin maintenance to be complete November 2016   01/09/2015 Breast MRI Interval marked response to chemotherapy with significant decrease in the size of the tumor, satellite nodule is now vaguely visible, 2.6 cm mass is now 0.9 cm   01/29/2015 Surgery Left breast lumpect[facility] by Dr. Ernst Bowler at [facility]; 3.2 cm area of microscopic cells, 2 sentinel lymph nodes negative T2 N0 M0 stage II a    CHIEF COMPLIANT: Follow-up after surgery Herceptin maintenance  INTERVAL HISTORY: Jonnelle Lawniczak is a  39 year old with above-mentioned history of left breast HER-2 positive invasive ductal carcinoma who completed neoadjuvant chemotherapy and underwent lumpectomy. She is recovered very well from surgery. Final pathology revealed microscopic focus of residual invasive cancer spanning a distance of 3.2 cm. 2 lymph nodes were negative. Previously she was complaining of emotional problems and tearfulness and crying. She is meeting with a Social worker. We have prescribed her Effexor but she has not filled it. She would like to try without any additional medications.  REVIEW OF SYSTEMS:   Constitutional: Denies fevers, chills or abnormal weight loss Eyes: Denies blurriness of vision Ears, nose, mouth, throat, and face: Denies mucositis or sore throat Respiratory: Denies cough, dyspnea or wheezes Cardiovascular: Denies palpitation, chest discomfort or lower extremity swelling Gastrointestinal:  Denies nausea, heartburn or change in bowel habits Skin: Denies abnormal skin rashes Lymphatics: Denies new lymphadenopathy or easy bruising Neurological:Denies numbness, tingling or new weaknesses Behavioral/Psych: Mood is stable, no new changes  Breast: Healing well from surgery. All other systems were reviewed with the patient and are negative.  I have reviewed the past medical history, past surgical history, social history and family history with the patient and they are unchanged from previous note.  ALLERGIES:  has No Known Allergies.  MEDICATIONS:  Current Outpatient Prescriptions  Medication Sig Dispense Refill  . acetaminophen (TYLENOL) 500 MG tablet Take 1,000 mg by mouth every 6 (six) hours as needed for mild pain or moderate pain.    Marland Kitchen ALPRAZolam (XANAX) 0.25 MG tablet Take 0.25 mg by mouth at bedtime as needed for anxiety (and for flying).     . diphenhydramine-acetaminophen (TYLENOL PM) 25-500 MG TABS Take 1 tablet  by mouth at bedtime as needed.    . docusate sodium (COLACE) 100 MG capsule Take  100 mg by mouth 2 (two) times daily.    Marland Kitchen isometheptene-acetaminophen-dichloralphenazone (MIDRIN) 65-325-100 MG capsule Take 1-2 capsules by mouth 4 (four) times daily as needed for migraine.     . lidocaine-prilocaine (EMLA) cream Apply to affected area once 30 g 3  . LORazepam (ATIVAN) 0.5 MG tablet Take 1 tablet (0.5 mg total) by mouth every 6 (six) hours as needed (Nausea or vomiting). 30 tablet 0  . venlafaxine XR (EFFEXOR-XR) 37.5 MG 24 hr capsule Take 1 capsule (37.5 mg total) by mouth daily with breakfast. (Patient not taking: Reported on 02/13/2015) 30 capsule 5   No current facility-administered medications for this visit.    PHYSICAL EXAMINATION: ECOG PERFORMANCE STATUS: 1 - Symptomatic but completely ambulatory  Filed Vitals:   02/13/15 0931  BP: 112/78  Pulse: 79  Temp: 98.1 F (36.7 C)  Resp: 18   Filed Weights   02/13/15 0931  Weight: 139 lb 12.8 oz (63.413 kg)    GENERAL:alert, no distress and comfortable SKIN: skin color, texture, turgor are normal, no rashes or significant lesions EYES: normal, Conjunctiva are pink and non-injected, sclera clear OROPHARYNX:no exudate, no erythema and lips, buccal mucosa, and tongue normal  NECK: supple, thyroid normal size, non-tender, without nodularity LYMPH:  no palpable lymphadenopathy in the cervical, axillary or inguinal LUNGS: clear to auscultation and percussion with normal breathing effort HEART: regular rate & rhythm and no murmurs and no lower extremity edema ABDOMEN:abdomen soft, non-tender and normal bowel sounds Musculoskeletal:no cyanosis of digits and no clubbing  NEURO: alert & oriented x 3 with fluent speech, no focal motor/sensory deficits   LABORATORY DATA:  I have reviewed the data as listed   Chemistry      Component Value Date/Time   NA 142 02/13/2015 0923   K 4.5 02/13/2015 0923   CO2 26 02/13/2015 0923   BUN 10.9 02/13/2015 0923   CREATININE 0.8 02/13/2015 0923      Component Value Date/Time    CALCIUM 9.4 02/13/2015 0923   ALKPHOS 57 02/13/2015 0923   AST 15 02/13/2015 0923   ALT 11 02/13/2015 0923   BILITOT 0.26 02/13/2015 0923       Lab Results  Component Value Date   WBC 3.2* 02/13/2015   HGB 11.7 02/13/2015   HCT 35.4 02/13/2015   MCV 97.7 02/13/2015   PLT 192 02/13/2015   NEUTROABS 1.6 02/13/2015    ASSESSMENT & PLAN:  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   Treatment summary: Neoadjuvant chemotherapy with Taxotere, carboplatin, Herceptin and Perjeta given once every 3 weeks from 09/19/2014 to 01/02/2015 . Cardiac monitoring: echocardiogram October 2015 EF 55%; 01/06/2015 EF 60-65% Chemotherapy related toxicities: Fatigue, alopecia, grade 1 neuropathy, nausea and vomiting grade 1, scalp hair folliculitis, grade 1 anemia. --------------------------------------------------------------------------------------------------------------------------------------------------------- Pathology review: Patient had lumpectomy at [facility] on 01/29/2015 and final pathology revealed residual microscopic invasive ductal carcinoma spanning 3.2 cm T2 N0 M0 stage II a  Plan: 1. Continue Herceptin every 3 weeks 2. Adjuvant radiation therapy when surgical scar is healed. We will make an appointment for her to see radiation oncology. 3. Once radiation is complete, we will initiate antiestrogen therapy. 4. After radiation: Anti-estrogen therapy with tamoxifen/AI +/- ovarian suppression Patient  has an appointment with radiation oncology next week.  Depression/emotional outbursts/moodiness: I prescribed Effexor XR 37.5 mg but she would like to try without taking any additional medications. She is meeting with counselor.   Return to clinic every 6 weeks for Herceptin maintenance.  No orders  of the defined types were placed in this encounter.   The patient has a good understanding of the overall plan. she agrees with it. She will call with any problems that may develop before her next visit here.   Rulon Eisenmenger, MD

## 2015-02-13 NOTE — Assessment & Plan Note (Signed)
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   Treatment summary: Neoadjuvant chemotherapy with Taxotere, carboplatin, Herceptin and Perjeta given once every 3 weeks from 09/19/2014 to 01/02/2015 . Cardiac monitoring: echocardiogram October 2015 EF 55%; 01/06/2015 EF 60-65% Chemotherapy related toxicities: Fatigue, alopecia, grade 1 neuropathy, nausea and vomiting grade 1, scalp hair folliculitis, grade 1 anemia. --------------------------------------------------------------------------------------------------------------------------------------------------------- Pathology review: Patient had lumpectomy at [facility] on 01/29/2015 and final pathology revealed Plan: 1. Continue Herceptin every 3 weeks 2. Adjuvant radiation therapy when surgical scar is healed. We will make an appointment for her to see radiation oncology. 3. Once radiation is complete, we will initiate antiestrogen therapy. 4. After radiation: Anti-estrogen therapy with tamoxifen/AI +/- ovarian suppression  Depression/emotional outbursts/moodiness: Currently on Effexor XR 37.5 mg daily with improvement in her symptoms.  Return to clinic every 6 weeks for Herceptin maintenance.

## 2015-02-16 NOTE — Progress Notes (Signed)
Location of Breast Cancer:Invasive ductal carcinoma of left upper-outer  Histology per Pathology Report: 08/20/14 Diagnosis Breast, left, needle core biopsy - INVASIVE DUCTAL CARCINOMA.  Receptor Status: ER(+), PR (+), Her2-neu amplified on CISH  Did patient present with symptoms (if so, please note symptoms) or was this found on screening mammography?: Found on self exam  Past/Anticipated interventions by surgeon, if PXT:GGYI breast lumpectomy by Dr.Gallagher at [facility]  Past/Anticipated interventions by medical oncology, if any: Chemotherapy: taxotere,carboplatin, herceptin and perjeta  Given every 3 weeks from 09/19/14-01/02/15. Follow up with herceptin every 6 weeks.  Lymphedema issues, if any: No   Pain issues, if any:  NO  SAFETY ISSUES:  Prior radiation? No  Pacemaker/ICD? No  Possible current pregnancy?last menstrual period 10/2014  Is the patient on methotrexate? NO  Current Complaints / other details:Married.G4P1.first pregnancy at age 56.Menarche age 86.Lmp 10/2014.oral contraceptives for 20 years. Mother premenopausal breast cancer age 80  NKDA     Emmaline Kluver, Kathyrn Drown, RN 02/16/2015,11:55 AM

## 2015-02-18 ENCOUNTER — Ambulatory Visit: Payer: Managed Care, Other (non HMO) | Admitting: Radiation Oncology

## 2015-02-19 ENCOUNTER — Ambulatory Visit
Admission: RE | Admit: 2015-02-19 | Discharge: 2015-02-19 | Disposition: A | Discharge status: Home / Self Care | Payer: Managed Care, Other (non HMO) | Source: Ambulatory Visit | Attending: Radiation Oncology | Admitting: Radiation Oncology

## 2015-02-19 VITALS — BP 106/75 | HR 73 | Temp 98.0°F | Wt 138.9 lb

## 2015-02-19 DIAGNOSIS — C50412 Malignant neoplasm of upper-outer quadrant of left female breast: Secondary | ICD-10-CM | POA: Diagnosis not present

## 2015-02-19 DIAGNOSIS — L599 Disorder of the skin and subcutaneous tissue related to radiation, unspecified: Secondary | ICD-10-CM | POA: Insufficient documentation

## 2015-02-19 DIAGNOSIS — Z510 Encounter for antineoplastic radiation therapy: Secondary | ICD-10-CM | POA: Diagnosis present

## 2015-02-19 DIAGNOSIS — Z170 Estrogen receptor positive status [ER+]: Secondary | ICD-10-CM | POA: Diagnosis not present

## 2015-02-19 NOTE — Progress Notes (Signed)
Please see the Nurse Progress Note in the MD Initial Consult Encounter for this patient. 

## 2015-02-24 ENCOUNTER — Ambulatory Visit
Admission: RE | Admit: 2015-02-24 | Discharge: 2015-02-24 | Disposition: A | Discharge status: Home / Self Care | Payer: Managed Care, Other (non HMO) | Source: Ambulatory Visit | Attending: Radiation Oncology | Admitting: Radiation Oncology

## 2015-02-24 DIAGNOSIS — Z510 Encounter for antineoplastic radiation therapy: Secondary | ICD-10-CM | POA: Diagnosis not present

## 2015-02-24 DIAGNOSIS — C50412 Malignant neoplasm of upper-outer quadrant of left female breast: Secondary | ICD-10-CM

## 2015-02-24 NOTE — Progress Notes (Signed)
Name: Kim Armstrong   MRN: [id number]  Date:  02/24/2015  DOB: 10/28/1976  Status:outpatient    DIAGNOSIS: Breast cancer.  CONSENT VERIFIED: yes   SET UP: Patient is setup supine   IMMOBILIZATION:  The following immobilization was used:Custom Moldable Pillow, breast board.   NARRATIVE: [name] was brought to the [facility].  Identity was confirmed.  All relevant records and images related to the planned course of therapy were reviewed.  Then, the patient was positioned in a stable reproducible clinical set-up for radiation therapy.  Wires were placed to delineate the clinical extent of breast tissue. A wire was placed on the scar as well.  CT images were obtained.  An isocenter was placed. Skin markings were placed.  The CT images were loaded into the planning software where the target and avoidance structures were contoured.  The radiation prescription was entered and confirmed. The patient was discharged in stable condition and tolerated simulation well.    TREATMENT PLANNING NOTE:  Treatment planning then occurred. I have requested : MLC's, isodose plan, basic dose calculation  I personally designed and supervised the construction of 3 medically necessary complex treatment devices for the protection of critical normal structures including the lungs and contralateral breast as well as the immobilization device which is necessary for set up certainty.   3D simulation occurred. I requested and analyzed a dose volume histogram of the heart, lungs and lumpectomy cavity.

## 2015-02-24 NOTE — Progress Notes (Signed)
Radiation Oncology         [phone] ________________________________  Name: Kim Armstrong      MRN: [id number]          Date: 02/24/2015              DOB: [birth year]  Optical Surface Tracking Plan:  Since intensity modulated radiotherapy (IMRT) and 3D conformal radiation treatment methods are predicated on accurate and precise positioning for treatment, intrafraction motion monitoring is medically necessary to ensure accurate and safe treatment delivery.  The ability to quantify intrafraction motion without excessive ionizing radiation dose can only be performed with optical surface tracking. Accordingly, surface imaging offers the opportunity to obtain 3D measurements of patient position throughout IMRT and 3D treatments without excessive radiation exposure.  I am ordering optical surface tracking for this patient's upcoming course of radiotherapy. ________________________________ Signature   Reference:   Ursula Alert, J, et al. Surface imaging-based analysis of intrafraction motion for breast radiotherapy patients.Journal of [place], n. 6, nov. 2014. ISSN [id number].   Available at: <http://www.jacmp.org/index.php/jacmp/article/view/4957>.

## 2015-03-02 ENCOUNTER — Encounter: Payer: Self-pay | Admitting: *Deleted

## 2015-03-03 ENCOUNTER — Ambulatory Visit: Payer: Managed Care, Other (non HMO) | Admitting: Radiation Oncology

## 2015-03-03 ENCOUNTER — Telehealth: Payer: Self-pay | Admitting: Hematology and Oncology

## 2015-03-03 DIAGNOSIS — Z510 Encounter for antineoplastic radiation therapy: Secondary | ICD-10-CM | POA: Diagnosis not present

## 2015-03-03 NOTE — Telephone Encounter (Signed)
Spoke with patient and she is aware of her appointments for week of 5/2

## 2015-03-04 ENCOUNTER — Ambulatory Visit: Payer: Managed Care, Other (non HMO)

## 2015-03-05 ENCOUNTER — Ambulatory Visit: Payer: Managed Care, Other (non HMO)

## 2015-03-06 ENCOUNTER — Ambulatory Visit: Payer: Managed Care, Other (non HMO)

## 2015-03-06 ENCOUNTER — Other Ambulatory Visit: Payer: Managed Care, Other (non HMO)

## 2015-03-08 DIAGNOSIS — Z510 Encounter for antineoplastic radiation therapy: Secondary | ICD-10-CM | POA: Diagnosis present

## 2015-03-08 DIAGNOSIS — C50412 Malignant neoplasm of upper-outer quadrant of left female breast: Secondary | ICD-10-CM | POA: Diagnosis not present

## 2015-03-08 DIAGNOSIS — Z170 Estrogen receptor positive status [ER+]: Secondary | ICD-10-CM | POA: Diagnosis not present

## 2015-03-08 DIAGNOSIS — L599 Disorder of the skin and subcutaneous tissue related to radiation, unspecified: Secondary | ICD-10-CM | POA: Diagnosis not present

## 2015-03-09 ENCOUNTER — Ambulatory Visit
Admission: RE | Admit: 2015-03-09 | Discharge: 2015-03-09 | Disposition: A | Discharge status: Home / Self Care | Payer: Managed Care, Other (non HMO) | Source: Ambulatory Visit | Attending: Radiation Oncology | Admitting: Radiation Oncology

## 2015-03-09 ENCOUNTER — Ambulatory Visit: Payer: Managed Care, Other (non HMO)

## 2015-03-09 DIAGNOSIS — Z510 Encounter for antineoplastic radiation therapy: Secondary | ICD-10-CM | POA: Diagnosis not present

## 2015-03-10 ENCOUNTER — Other Ambulatory Visit (HOSPITAL_BASED_OUTPATIENT_CLINIC_OR_DEPARTMENT_OTHER): Payer: Managed Care, Other (non HMO)

## 2015-03-10 ENCOUNTER — Ambulatory Visit
Admission: RE | Admit: 2015-03-10 | Discharge: 2015-03-10 | Disposition: A | Discharge status: Home / Self Care | Payer: Managed Care, Other (non HMO) | Source: Ambulatory Visit | Attending: Radiation Oncology | Admitting: Radiation Oncology

## 2015-03-10 ENCOUNTER — Encounter: Payer: Self-pay | Admitting: Radiation Oncology

## 2015-03-10 ENCOUNTER — Ambulatory Visit: Payer: Managed Care, Other (non HMO)

## 2015-03-10 VITALS — BP 106/70 | HR 61 | Temp 98.0°F | Resp 12 | Wt 139.6 lb

## 2015-03-10 DIAGNOSIS — C50412 Malignant neoplasm of upper-outer quadrant of left female breast: Secondary | ICD-10-CM

## 2015-03-10 DIAGNOSIS — Z510 Encounter for antineoplastic radiation therapy: Secondary | ICD-10-CM | POA: Diagnosis not present

## 2015-03-10 LAB — COMPREHENSIVE METABOLIC PANEL (CC13)
ALT: 12 U/L (ref 0–55)
AST: 15 U/L (ref 5–34)
Albumin: 4 g/dL (ref 3.5–5.0)
Alkaline Phosphatase: 57 U/L (ref 40–150)
Anion Gap: 7 mEq/L (ref 3–11)
BUN: 11.9 mg/dL (ref 7.0–26.0)
CO2: 26 mEq/L (ref 22–29)
Calcium: 9.4 mg/dL (ref 8.4–10.4)
Chloride: 107 mEq/L (ref 98–109)
Creatinine: 0.8 mg/dL (ref 0.6–1.1)
EGFR: 89 mL/min/{1.73_m2} — ABNORMAL LOW (ref >90)
Glucose: 92 mg/dl (ref 70–140)
Potassium: 4.2 mEq/L (ref 3.5–5.1)
Sodium: 140 mEq/L (ref 136–145)
Total Bilirubin: 0.2 mg/dL (ref 0.20–1.20)
Total Protein: 6.6 g/dL (ref 6.4–8.3)

## 2015-03-10 LAB — CBC WITH DIFFERENTIAL/PLATELET
BASO%: 0.3 % (ref 0.0–2.0)
Basophils Absolute: 0 10*3/uL (ref 0.0–0.1)
EOS%: 6.9 % (ref 0.0–7.0)
Eosinophils Absolute: 0.2 10*3/uL (ref 0.0–0.5)
HCT: 35.3 % (ref 34.8–46.6)
HGB: 11.9 g/dL (ref 11.6–15.9)
LYMPH%: 27.7 % (ref 14.0–49.7)
MCH: 31.9 pg (ref 25.1–34.0)
MCHC: 33.7 g/dL (ref 31.5–36.0)
MCV: 94.6 fL (ref 79.5–101.0)
MONO#: 0.3 10*3/uL (ref 0.1–0.9)
MONO%: 9.3 % (ref 0.0–14.0)
NEUT#: 1.8 10*3/uL (ref 1.5–6.5)
NEUT%: 55.8 % (ref 38.4–76.8)
Platelets: 158 10*3/uL (ref 145–400)
RBC: 3.73 10*6/uL (ref 3.70–5.45)
RDW: 11.7 % (ref 11.2–14.5)
WBC: 3.2 10*3/uL — ABNORMAL LOW (ref 3.9–10.3)
lymph#: 0.9 10*3/uL (ref 0.9–3.3)

## 2015-03-10 MED ORDER — RADIAPLEXRX EX GEL
Freq: Once | CUTANEOUS | Status: AC
  Administered 2015-03-10: 13:00:00 via TOPICAL

## 2015-03-10 NOTE — Progress Notes (Signed)
RADIATION TREATMENT  SKIN CARE-BREAST    RECOMMENDATIONS: ? Use unscented soap (Dove) ? When showering it is fine for water to touch the area, but please avoid direct spray on the treatment field if skin becomes irritated.  Also, wash inside and around the marked area ? When drying gently blot the area  ? PLEASE DO NOT APPLY ANY OTHER CREAMS, LOTIONS, POWDERS, PERFUMES, OILS OR ALCOHOL PRODUCTS (OTHER THAN WHAT IS GIVEN TO YOU BY THE RADIATION TEAM) TO THE TREATMENT AREA DURING RADIATION THERAPY   SKIN CARE: ? Moisturizer o You will be given (Radiaplex Gel) to use. Apply twice daily, once after treatment and then again prior to bedtime o Your Radiation Oncologist may suggest other skin care products as needed  DEODORANT:  ? Nursing will provide deodorant (ALRA) to be used during your radiation treatment  Deodorant Alternatives: ? A combination of equal amounts of baking soda and corn starch.  Mix together and powder puff on ? Toms of [place]-available at most [employer] ([employer], [employer]) ? Thai Crystal Deodorant Mist-100% Natural Mineral Salts and Purified water ([employer], [employer])   PLEASE DO NOT APPLY THE RADIAPLEX GEL OR ALRA DEODORANT WITHIN 4 HOURS PRIOR TO RADIATION TREATMENT  

## 2015-03-10 NOTE — Progress Notes (Signed)
Pt here for patient teaching.  Pt given Radiation and You booklet, skin care instructions and Radiaplex gel, pt using TOMS brand deodorant and it is working well. Reviewed areas of pertinence such as fatigue, hair loss, skin changes, breast tenderness and breast swelling . Pt able to give teach back of to pat skin and use unscented/gentle soap,apply Radiaplex bid, avoid applying anything to skin within 4 hours of treatment, avoid wearing an under wire bra and to use an electric razor if they must shave. Pt demonstrated understanding of information given and will contact nursing with any questions or concerns.

## 2015-03-10 NOTE — Progress Notes (Signed)
She is currently in no pain.  Pt left breast- warm dry and intact.  Pt denies edema. Pt education today.  BP 106/70 mmHg  Pulse 61  Temp(Src) 98 F (36.7 C) (Oral)  Resp 12  Wt 139 lb 9.6 oz (63.322 kg)  SpO2 100%  LMP 10/18/2014

## 2015-03-10 NOTE — Progress Notes (Signed)
Weekly Management Note Current Dose: 1.8  Gy  Projected Dose: 61 Gy   Narrative:  The patient presents for routine under treatment assessment.  CBCT/MVCT images/Port film x-rays were reviewed.  The chart was checked. Doing well. No complaints. RN education performed.   Physical Findings: Weight: 139 lb 9.6 oz (63.322 kg). Unchanged  Impression:  The patient is tolerating radiation.  Plan:  Continue treatment as planned. Start radiaplex.

## 2015-03-11 ENCOUNTER — Ambulatory Visit: Payer: Managed Care, Other (non HMO)

## 2015-03-11 ENCOUNTER — Ambulatory Visit (HOSPITAL_BASED_OUTPATIENT_CLINIC_OR_DEPARTMENT_OTHER): Payer: Managed Care, Other (non HMO)

## 2015-03-11 ENCOUNTER — Ambulatory Visit
Admission: RE | Admit: 2015-03-11 | Discharge: 2015-03-11 | Disposition: A | Discharge status: Home / Self Care | Payer: Managed Care, Other (non HMO) | Source: Ambulatory Visit | Attending: Radiation Oncology | Admitting: Radiation Oncology

## 2015-03-11 VITALS — BP 105/71 | HR 74 | Temp 98.6°F

## 2015-03-11 DIAGNOSIS — Z5112 Encounter for antineoplastic immunotherapy: Secondary | ICD-10-CM

## 2015-03-11 DIAGNOSIS — C50412 Malignant neoplasm of upper-outer quadrant of left female breast: Secondary | ICD-10-CM | POA: Diagnosis not present

## 2015-03-11 DIAGNOSIS — Z510 Encounter for antineoplastic radiation therapy: Secondary | ICD-10-CM | POA: Diagnosis not present

## 2015-03-11 MED ORDER — SODIUM CHLORIDE 0.9 % IV SOLN
Freq: Once | INTRAVENOUS | Status: AC
  Administered 2015-03-11: 10:00:00 via INTRAVENOUS

## 2015-03-11 MED ORDER — DIPHENHYDRAMINE HCL 25 MG PO CAPS: 50.0000 mg | ORAL_CAPSULE | Freq: Once | ORAL | Status: DC

## 2015-03-11 MED ORDER — SODIUM CHLORIDE 0.9 % IJ SOLN
10.0000 mL | INTRAMUSCULAR | Status: DC | PRN
  Administered 2015-03-11: 10 mL
  Filled 2015-03-11: qty 10

## 2015-03-11 MED ORDER — HEPARIN SOD (PORK) LOCK FLUSH 100 UNIT/ML IV SOLN
500.0000 [IU] | Freq: Once | INTRAVENOUS | Status: AC | PRN
  Administered 2015-03-11: 500 [IU]
  Filled 2015-03-11: qty 5

## 2015-03-11 MED ORDER — TRASTUZUMAB CHEMO INJECTION 440 MG
6.0000 mg/kg | Freq: Once | INTRAVENOUS | Status: AC
  Administered 2015-03-11: 399 mg via INTRAVENOUS
  Filled 2015-03-11: qty 19

## 2015-03-11 MED ORDER — ACETAMINOPHEN 325 MG PO TABS
650.0000 mg | ORAL_TABLET | Freq: Once | ORAL | Status: AC
  Administered 2015-03-11: 650 mg via ORAL

## 2015-03-11 NOTE — Patient Instructions (Signed)
[facility] Discharge Instructions for Patients Receiving Chemotherapy  Today you received the following chemotherapy agents Herceptin.  To help prevent nausea and vomiting after your treatment, we encourage you to take your nausea medication as directed.   If you develop nausea and vomiting that is not controlled by your nausea medication, call the clinic.   BELOW ARE SYMPTOMS THAT SHOULD BE REPORTED IMMEDIATELY:  *FEVER GREATER THAN 100.5 F  *CHILLS WITH OR WITHOUT FEVER  NAUSEA AND VOMITING THAT IS NOT CONTROLLED WITH YOUR NAUSEA MEDICATION  *UNUSUAL SHORTNESS OF BREATH  *UNUSUAL BRUISING OR BLEEDING  TENDERNESS IN MOUTH AND THROAT WITH OR WITHOUT PRESENCE OF ULCERS  *URINARY PROBLEMS  *BOWEL PROBLEMS  UNUSUAL RASH Items with * indicate a potential emergency and should be followed up as soon as possible.  Feel free to call the clinic you have any questions or concerns. The clinic phone number is [phone].  

## 2015-03-12 ENCOUNTER — Ambulatory Visit
Admission: RE | Admit: 2015-03-12 | Discharge: 2015-03-12 | Disposition: A | Discharge status: Home / Self Care | Payer: Managed Care, Other (non HMO) | Source: Ambulatory Visit | Attending: Radiation Oncology | Admitting: Radiation Oncology

## 2015-03-12 ENCOUNTER — Ambulatory Visit: Payer: Managed Care, Other (non HMO)

## 2015-03-12 DIAGNOSIS — Z510 Encounter for antineoplastic radiation therapy: Secondary | ICD-10-CM | POA: Diagnosis not present

## 2015-03-13 ENCOUNTER — Ambulatory Visit: Payer: Managed Care, Other (non HMO)

## 2015-03-13 ENCOUNTER — Ambulatory Visit
Admission: RE | Admit: 2015-03-13 | Discharge: 2015-03-13 | Disposition: A | Discharge status: Home / Self Care | Payer: Managed Care, Other (non HMO) | Source: Ambulatory Visit | Attending: Radiation Oncology | Admitting: Radiation Oncology

## 2015-03-13 DIAGNOSIS — Z510 Encounter for antineoplastic radiation therapy: Secondary | ICD-10-CM | POA: Diagnosis not present

## 2015-03-15 ENCOUNTER — Ambulatory Visit: Payer: Managed Care, Other (non HMO)

## 2015-03-16 ENCOUNTER — Ambulatory Visit: Payer: Managed Care, Other (non HMO)

## 2015-03-16 ENCOUNTER — Ambulatory Visit
Admission: RE | Admit: 2015-03-16 | Discharge: 2015-03-16 | Disposition: A | Discharge status: Home / Self Care | Payer: Managed Care, Other (non HMO) | Source: Ambulatory Visit | Attending: Radiation Oncology | Admitting: Radiation Oncology

## 2015-03-16 DIAGNOSIS — Z510 Encounter for antineoplastic radiation therapy: Secondary | ICD-10-CM | POA: Diagnosis not present

## 2015-03-17 ENCOUNTER — Ambulatory Visit
Admission: RE | Admit: 2015-03-17 | Discharge: 2015-03-17 | Disposition: A | Discharge status: Home / Self Care | Payer: Managed Care, Other (non HMO) | Source: Ambulatory Visit | Attending: Radiation Oncology | Admitting: Radiation Oncology

## 2015-03-17 ENCOUNTER — Ambulatory Visit: Payer: Managed Care, Other (non HMO)

## 2015-03-17 VITALS — BP 103/71 | HR 77 | Temp 97.8°F | Wt 139.5 lb

## 2015-03-17 DIAGNOSIS — Z510 Encounter for antineoplastic radiation therapy: Secondary | ICD-10-CM | POA: Diagnosis not present

## 2015-03-17 DIAGNOSIS — C50412 Malignant neoplasm of upper-outer quadrant of left female breast: Secondary | ICD-10-CM

## 2015-03-17 NOTE — Progress Notes (Signed)
Weekly Management Note Current Dose: 10.8  Gy  Projected Dose: 61 Gy   Narrative:  The patient presents for routine under treatment assessment.  CBCT/MVCT images/Port film x-rays were reviewed.  The chart was checked. No complaints.  Physical Findings: Weight: 139 lb 8 oz (63.277 kg). Unchanged  Impression:  The patient is tolerating radiation.  Plan:  Continue treatment as planned. Continue radiaplex.

## 2015-03-18 ENCOUNTER — Ambulatory Visit
Admission: RE | Admit: 2015-03-18 | Discharge: 2015-03-18 | Disposition: A | Discharge status: Home / Self Care | Payer: Managed Care, Other (non HMO) | Source: Ambulatory Visit | Attending: Radiation Oncology | Admitting: Radiation Oncology

## 2015-03-18 ENCOUNTER — Ambulatory Visit: Payer: Managed Care, Other (non HMO)

## 2015-03-18 DIAGNOSIS — Z510 Encounter for antineoplastic radiation therapy: Secondary | ICD-10-CM | POA: Diagnosis not present

## 2015-03-19 ENCOUNTER — Ambulatory Visit
Admission: RE | Admit: 2015-03-19 | Discharge: 2015-03-19 | Disposition: A | Discharge status: Home / Self Care | Payer: Managed Care, Other (non HMO) | Source: Ambulatory Visit | Attending: Radiation Oncology | Admitting: Radiation Oncology

## 2015-03-19 DIAGNOSIS — Z510 Encounter for antineoplastic radiation therapy: Secondary | ICD-10-CM | POA: Diagnosis not present

## 2015-03-20 ENCOUNTER — Ambulatory Visit
Admission: RE | Admit: 2015-03-20 | Discharge: 2015-03-20 | Disposition: A | Discharge status: Home / Self Care | Payer: Managed Care, Other (non HMO) | Source: Ambulatory Visit | Attending: Radiation Oncology | Admitting: Radiation Oncology

## 2015-03-20 DIAGNOSIS — Z510 Encounter for antineoplastic radiation therapy: Secondary | ICD-10-CM | POA: Diagnosis not present

## 2015-03-21 ENCOUNTER — Ambulatory Visit: Payer: Managed Care, Other (non HMO)

## 2015-03-22 ENCOUNTER — Ambulatory Visit: Payer: Managed Care, Other (non HMO)

## 2015-03-23 ENCOUNTER — Ambulatory Visit
Admission: RE | Admit: 2015-03-23 | Discharge: 2015-03-23 | Disposition: A | Discharge status: Home / Self Care | Payer: Managed Care, Other (non HMO) | Source: Ambulatory Visit | Attending: Radiation Oncology | Admitting: Radiation Oncology

## 2015-03-23 ENCOUNTER — Ambulatory Visit: Payer: Managed Care, Other (non HMO)

## 2015-03-23 DIAGNOSIS — Z510 Encounter for antineoplastic radiation therapy: Secondary | ICD-10-CM | POA: Diagnosis not present

## 2015-03-24 ENCOUNTER — Ambulatory Visit
Admission: RE | Admit: 2015-03-24 | Discharge: 2015-03-24 | Disposition: A | Discharge status: Home / Self Care | Payer: Managed Care, Other (non HMO) | Source: Ambulatory Visit | Attending: Radiation Oncology | Admitting: Radiation Oncology

## 2015-03-24 DIAGNOSIS — C50412 Malignant neoplasm of upper-outer quadrant of left female breast: Secondary | ICD-10-CM

## 2015-03-24 DIAGNOSIS — Z510 Encounter for antineoplastic radiation therapy: Secondary | ICD-10-CM | POA: Diagnosis not present

## 2015-03-24 NOTE — Progress Notes (Signed)
Weekly Management Note Current Dose: 34.2  Gy  Projected Dose: 61 Gy   Narrative:  The patient presents for routine under treatment assessment.  CBCT/MVCT images/Port film x-rays were reviewed.  The chart was checked. Doing well. No complaints.   Physical Findings: Weight:  . Unchanged  Impression:  The patient is tolerating radiation.  Plan:  Continue treatment as planned. Continue radiaplex.

## 2015-03-25 ENCOUNTER — Ambulatory Visit
Admission: RE | Admit: 2015-03-25 | Discharge: 2015-03-25 | Disposition: A | Discharge status: Home / Self Care | Payer: Managed Care, Other (non HMO) | Source: Ambulatory Visit | Attending: Radiation Oncology | Admitting: Radiation Oncology

## 2015-03-25 DIAGNOSIS — Z510 Encounter for antineoplastic radiation therapy: Secondary | ICD-10-CM | POA: Diagnosis not present

## 2015-03-26 ENCOUNTER — Ambulatory Visit: Payer: Managed Care, Other (non HMO) | Admitting: Radiation Oncology

## 2015-03-26 ENCOUNTER — Ambulatory Visit: Payer: Managed Care, Other (non HMO)

## 2015-03-26 ENCOUNTER — Ambulatory Visit
Admission: RE | Admit: 2015-03-26 | Discharge: 2015-03-26 | Disposition: A | Discharge status: Home / Self Care | Payer: Managed Care, Other (non HMO) | Source: Ambulatory Visit | Attending: Radiation Oncology | Admitting: Radiation Oncology

## 2015-03-26 DIAGNOSIS — Z510 Encounter for antineoplastic radiation therapy: Secondary | ICD-10-CM | POA: Diagnosis not present

## 2015-03-27 ENCOUNTER — Other Ambulatory Visit (HOSPITAL_BASED_OUTPATIENT_CLINIC_OR_DEPARTMENT_OTHER): Payer: Managed Care, Other (non HMO)

## 2015-03-27 ENCOUNTER — Encounter: Payer: Self-pay | Admitting: *Deleted

## 2015-03-27 ENCOUNTER — Ambulatory Visit
Admission: RE | Admit: 2015-03-27 | Discharge: 2015-03-27 | Disposition: A | Discharge status: Home / Self Care | Payer: Managed Care, Other (non HMO) | Source: Ambulatory Visit | Attending: Radiation Oncology | Admitting: Radiation Oncology

## 2015-03-27 ENCOUNTER — Telehealth: Payer: Self-pay | Admitting: Hematology and Oncology

## 2015-03-27 ENCOUNTER — Ambulatory Visit (HOSPITAL_BASED_OUTPATIENT_CLINIC_OR_DEPARTMENT_OTHER): Payer: Managed Care, Other (non HMO)

## 2015-03-27 ENCOUNTER — Ambulatory Visit (HOSPITAL_BASED_OUTPATIENT_CLINIC_OR_DEPARTMENT_OTHER): Payer: Managed Care, Other (non HMO) | Admitting: Hematology and Oncology

## 2015-03-27 VITALS — BP 115/77 | HR 60 | Temp 97.5°F | Resp 18 | Ht 67.0 in | Wt 142.0 lb

## 2015-03-27 DIAGNOSIS — C50412 Malignant neoplasm of upper-outer quadrant of left female breast: Secondary | ICD-10-CM

## 2015-03-27 DIAGNOSIS — Z5112 Encounter for antineoplastic immunotherapy: Secondary | ICD-10-CM | POA: Diagnosis not present

## 2015-03-27 DIAGNOSIS — Z510 Encounter for antineoplastic radiation therapy: Secondary | ICD-10-CM | POA: Diagnosis not present

## 2015-03-27 LAB — COMPREHENSIVE METABOLIC PANEL (CC13)
ALT: 10 U/L (ref 0–55)
AST: 15 U/L (ref 5–34)
Albumin: 3.8 g/dL (ref 3.5–5.0)
Alkaline Phosphatase: 58 U/L (ref 40–150)
Anion Gap: 10 mEq/L (ref 3–11)
BUN: 13.2 mg/dL (ref 7.0–26.0)
CO2: 24 mEq/L (ref 22–29)
Calcium: 9.1 mg/dL (ref 8.4–10.4)
Chloride: 107 mEq/L (ref 98–109)
Creatinine: 0.8 mg/dL (ref 0.6–1.1)
EGFR: >90 mL/min/{1.73_m2} (ref >90)
Glucose: 92 mg/dl (ref 70–140)
Potassium: 4.1 mEq/L (ref 3.5–5.1)
Sodium: 141 mEq/L (ref 136–145)
Total Bilirubin: 0.3 mg/dL (ref 0.20–1.20)
Total Protein: 6.5 g/dL (ref 6.4–8.3)

## 2015-03-27 LAB — CBC WITH DIFFERENTIAL/PLATELET
BASO%: 0.9 % (ref 0.0–2.0)
Basophils Absolute: 0 10*3/uL (ref 0.0–0.1)
EOS%: 6 % (ref 0.0–7.0)
Eosinophils Absolute: 0.2 10*3/uL (ref 0.0–0.5)
HCT: 35.9 % (ref 34.8–46.6)
HGB: 12 g/dL (ref 11.6–15.9)
LYMPH%: 21.4 % (ref 14.0–49.7)
MCH: 31.1 pg (ref 25.1–34.0)
MCHC: 33.3 g/dL (ref 31.5–36.0)
MCV: 93.4 fL (ref 79.5–101.0)
MONO#: 0.3 10*3/uL (ref 0.1–0.9)
MONO%: 9.8 % (ref 0.0–14.0)
NEUT#: 2 10*3/uL (ref 1.5–6.5)
NEUT%: 61.9 % (ref 38.4–76.8)
Platelets: 185 10*3/uL (ref 145–400)
RBC: 3.85 10*6/uL (ref 3.70–5.45)
RDW: 12.5 % (ref 11.2–14.5)
WBC: 3.2 10*3/uL — ABNORMAL LOW (ref 3.9–10.3)
lymph#: 0.7 10*3/uL — ABNORMAL LOW (ref 0.9–3.3)

## 2015-03-27 MED ORDER — HEPARIN SOD (PORK) LOCK FLUSH 100 UNIT/ML IV SOLN
500.0000 [IU] | Freq: Once | INTRAVENOUS | Status: AC | PRN
  Administered 2015-03-27: 500 [IU]
  Filled 2015-03-27: qty 5

## 2015-03-27 MED ORDER — ACETAMINOPHEN 325 MG PO TABS
650.0000 mg | ORAL_TABLET | Freq: Once | ORAL | Status: AC
  Administered 2015-03-27: 650 mg via ORAL

## 2015-03-27 MED ORDER — SODIUM CHLORIDE 0.9 % IJ SOLN
10.0000 mL | INTRAMUSCULAR | Status: DC | PRN
  Administered 2015-03-27: 10 mL
  Filled 2015-03-27: qty 10

## 2015-03-27 MED ORDER — TRASTUZUMAB CHEMO INJECTION 440 MG
6.0000 mg/kg | Freq: Once | INTRAVENOUS | Status: AC
  Administered 2015-03-27: 399 mg via INTRAVENOUS
  Filled 2015-03-27: qty 19

## 2015-03-27 MED ORDER — FLUCONAZOLE 100 MG PO TABS: 100.0000 mg | ORAL_TABLET | Freq: Every day | ORAL | Status: DC

## 2015-03-27 MED ORDER — SODIUM CHLORIDE 0.9 % IV SOLN
Freq: Once | INTRAVENOUS | Status: AC
  Administered 2015-03-27: 10:00:00 via INTRAVENOUS

## 2015-03-27 NOTE — Progress Notes (Signed)
Marcellus Scott RN here to talk with patient UP:BDHDIXBO study. Patient refused benadryl.  States she has used only the tylenol as premed and has had no problems without the benadryl.

## 2015-03-27 NOTE — Progress Notes (Signed)
Patient Care Team: Thurnell Lose, MD as PCP - General (Obstetrics and Gynecology) Thurnell Lose, MD (Obstetrics and Gynecology) Erroll Luna, MD as Consulting Physician (General Surgery) Nicholas Lose, MD as Consulting Physician (Hematology and Oncology) Thea Silversmith, MD as Consulting Physician (Radiation Oncology) Erroll Luna, MD as Consulting Physician (General Surgery) Nicholas Lose, MD as Consulting Physician (Hematology and Oncology) Thea Silversmith, MD as Consulting Physician (Radiation Oncology)  DIAGNOSIS: Breast cancer of upper-outer quadrant of left female breast   Staging form: Breast, AJCC 7th Edition     Clinical: Stage IIA (T2, N0, cM0) - Unsigned       Staging comments: Staged at breast conference on 10.21.15      Pathologic: No stage assigned - Unsigned   SUMMARY OF ONCOLOGIC HISTORY:   Breast cancer of upper-outer quadrant of left female breast   08/20/2014 Initial Diagnosis Invasive ductal carcinoma grade 3, ER PR positive HER-2 positive ratio 3.5, Ki-67 25%; biopsy of satellite lesion fibroadenoma   08/26/2014 Breast MRI Left breast: Upper-outer quadrant 1.4 cm from nipple 1.9 x 2.5 x 2.6 cm enhancing mass with the 8 mm nodule 1 cm posterior Right breast: 1.4 x 1.6 cm mass upper outer quadrant biopsy proven fibroadenoma:   09/19/2014 -  Neo-Adjuvant Chemotherapy Neoadjuvant chemotherapy with Taxotere, carboplatin, Herceptin and Perjeta x6 cycles followed by Herceptin maintenance to be complete November 2016   01/09/2015 Breast MRI Interval marked response to chemotherapy with significant decrease in the size of the tumor, satellite nodule is now vaguely visible, 2.6 cm mass is now 0.9 cm   01/29/2015 Surgery Left breast lumpectomy by Dr. Ernst Bowler at [facility]; 3.2 cm area of microscopic cells, 2 sentinel lymph nodes negative T2 N0 M0 stage II a    CHIEF COMPLIANT: Follow-up of Herceptin  INTERVAL HISTORY: Kim Armstrong is a 39 year old with  above-mentioned history of left breast cancer underwent neoadjuvant chemotherapy followed by lumpectomy currently on adjuvant radiation therapy to she is here today for Herceptin maintenance therapy. She does get diarrhea intermittently. She takes Imodium for it. Her husband died in a motor vehicle accident recently. He was absolutely an amazing partner for her and We offered our heartfelt sincere condolences to her.  REVIEW OF SYSTEMS:   Constitutional: Denies fevers, chills or abnormal weight loss Eyes: Denies blurriness of vision Ears, nose, mouth, throat, and face: Denies mucositis or sore throat Respiratory: Denies cough, dyspnea or wheezes Cardiovascular: Denies palpitation, chest discomfort or lower extremity swelling Gastrointestinal:  Denies nausea, heartburn or change in bowel habits Skin: Denies abnormal skin rashes Lymphatics: Denies new lymphadenopathy or easy bruising Neurological:Denies numbness, tingling or new weaknesses Behavioral/Psych: Mood is stable, no new changes  Breast:  denies any pain or lumps or nodules in either breasts All other systems were reviewed with the patient and are negative.  I have reviewed the past medical history, past surgical history, social history and family history with the patient and they are unchanged from previous note.  ALLERGIES:  has No Known Allergies.  MEDICATIONS:  Current Outpatient Prescriptions  Medication Sig Dispense Refill  . acetaminophen (TYLENOL) 500 MG tablet Take 1,000 mg by mouth every 6 (six) hours as needed for mild pain or moderate pain.    Marland Kitchen ALPRAZolam (XANAX) 0.25 MG tablet Take 0.25 mg by mouth at bedtime as needed for anxiety (and for flying).     . diphenhydramine-acetaminophen (TYLENOL PM) 25-500 MG TABS Take 1 tablet by mouth at bedtime as needed.    . docusate sodium (COLACE) 100  MG capsule Take 100 mg by mouth 2 (two) times daily.    Marland Kitchen isometheptene-acetaminophen-dichloralphenazone (MIDRIN) 65-325-100 MG  capsule Take 1-2 capsules by mouth 4 (four) times daily as needed for migraine.     . lidocaine-prilocaine (EMLA) cream Apply to affected area once 30 g 3  . LORazepam (ATIVAN) 0.5 MG tablet Take 1 tablet (0.5 mg total) by mouth every 6 (six) hours as needed (Nausea or vomiting). 30 tablet 0  . venlafaxine XR (EFFEXOR-XR) 37.5 MG 24 hr capsule Take 1 capsule (37.5 mg total) by mouth daily with breakfast. 30 capsule 5  . fluconazole (DIFLUCAN) 100 MG tablet Take 1 tablet (100 mg total) by mouth daily. 10 tablet 0   No current facility-administered medications for this visit.    PHYSICAL EXAMINATION: ECOG PERFORMANCE STATUS: 1 - Symptomatic but completely ambulatory  Filed Vitals:   03/27/15 0918  BP: 115/77  Pulse: 60  Temp: 97.5 F (36.4 C)  Resp: 18   Filed Weights   03/27/15 0918  Weight: 142 lb (64.411 kg)    GENERAL:alert, no distress and comfortable SKIN: skin color, texture, turgor are normal, no rashes or significant lesions EYES: normal, Conjunctiva are pink and non-injected, sclera clear OROPHARYNX:no exudate, no erythema and lips, buccal mucosa, and tongue normal  NECK: supple, thyroid normal size, non-tender, without nodularity LYMPH:  no palpable lymphadenopathy in the cervical, axillary or inguinal LUNGS: clear to auscultation and percussion with normal breathing effort HEART: regular rate & rhythm and no murmurs and no lower extremity edema ABDOMEN:abdomen soft, non-tender and normal bowel sounds Musculoskeletal:no cyanosis of digits and no clubbing  NEURO: alert & oriented x 3 with fluent speech, no focal motor/sensory deficits  LABORATORY DATA:  I have reviewed the data as listed   Chemistry      Component Value Date/Time   NA 140 03/10/2015 0905   K 4.2 03/10/2015 0905   CO2 26 03/10/2015 0905   BUN 11.9 03/10/2015 0905   CREATININE 0.8 03/10/2015 0905      Component Value Date/Time   CALCIUM 9.4 03/10/2015 0905   ALKPHOS 57 03/10/2015 0905   AST 15  03/10/2015 0905   ALT 12 03/10/2015 0905   BILITOT 0.20 03/10/2015 0905       Lab Results  Component Value Date   WBC 3.2* 03/27/2015   HGB 12.0 03/27/2015   HCT 35.9 03/27/2015   MCV 93.4 03/27/2015   PLT 185 03/27/2015   NEUTROABS 2.0 03/27/2015   ASSESSMENT & PLAN:  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  Pathology: Lumpectomy at [facility] on 01/29/2015: residual microscopic invasive ductal carcinoma spanning 3.2 cm T2 N0 M0 Pathological stage II a  Treatment summary: Neoadjuvant chemotherapy with Taxotere, carboplatin, Herceptin and Perjeta given once every 3 weeks from 09/19/2014 to 01/02/2015 . Cardiac monitoring: echocardiogram October 2015 EF 55%; 01/06/2015 EF 60-65% Chemotherapy related toxicities: Fatigue, alopecia, grade 1 neuropathy, nausea and vomiting grade 1, scalp hair folliculitis, grade 1 anemia.  Current treatment:  1. Herceptin every 3 weeks 2. Adjuvant radiation therapy is ongoing  Plan: 1. Once radiation is complete, we will initiate antiestrogen therapy. 2. After radiation: Anti-estrogen therapy with tamoxifen/AI +/- ovarian suppression (with consideration for PALLAS clinical trial) I ordered an echocardiogram to be done in June. Return to clinic in 6 weeks for follow-up  Orders Placed This Encounter  Procedures  . Echocardiogram    Assess EF. Dr.Bensimhon to read    Standing Status: Future     Number of Occurrences:      Standing Expiration Date: 06/26/2016    Order Specific Question:  Where should this test be performed    Answer:  [facility])    Order Specific Question:  Complete or Limited study?    Answer:  Complete    Order Specific Question:  With Image Enhancing Agent or without Image Enhancing Agent?    Answer:   With Image Enhancing Agent    Order Specific Question:  Reason for exam-Echo    Answer:  Other - See Comments Section   The patient has a good understanding of the overall plan. she agrees with it. she will call with any problems that may develop before the next visit here.   Rulon Eisenmenger, MD '@DATE' @

## 2015-03-27 NOTE — Telephone Encounter (Signed)
Gave avs & calendar for July. Sent message to LD for auth for ECHO

## 2015-03-27 NOTE — Assessment & Plan Note (Signed)
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  Pathology: Lumpectomy at [facility] on 01/29/2015: residual microscopic invasive ductal carcinoma spanning 3.2 cm T2 N0 M0 Pathological stage II a  Treatment summary: Neoadjuvant chemotherapy with Taxotere, carboplatin, Herceptin and Perjeta given once every 3 weeks from 09/19/2014 to 01/02/2015 . Cardiac monitoring: echocardiogram October 2015 EF 55%; 01/06/2015 EF 60-65% Chemotherapy related toxicities: Fatigue, alopecia, grade 1 neuropathy, nausea and vomiting grade 1, scalp hair folliculitis, grade 1 anemia.  Current treatment:  1. Herceptin every 3 weeks 2. Adjuvant radiation therapy is ongoing  Plan: 1. Once radiation is complete, we will initiate antiestrogen therapy. 2. After radiation: Anti-estrogen therapy with tamoxifen/AI +/- ovarian suppression (with consideration for PALLAS clinical trial)  Return to clinic in 6 weeks for follow-up

## 2015-03-27 NOTE — Patient Instructions (Signed)
[facility] Discharge Instructions for Patients Receiving Chemotherapy  Today you received the following chemotherapy agents Herceptin.  To help prevent nausea and vomiting after your treatment, we encourage you to take your nausea medication as directed.   If you develop nausea and vomiting that is not controlled by your nausea medication, call the clinic.   BELOW ARE SYMPTOMS THAT SHOULD BE REPORTED IMMEDIATELY:  *FEVER GREATER THAN 100.5 F  *CHILLS WITH OR WITHOUT FEVER  NAUSEA AND VOMITING THAT IS NOT CONTROLLED WITH YOUR NAUSEA MEDICATION  *UNUSUAL SHORTNESS OF BREATH  *UNUSUAL BRUISING OR BLEEDING  TENDERNESS IN MOUTH AND THROAT WITH OR WITHOUT PRESENCE OF ULCERS  *URINARY PROBLEMS  *BOWEL PROBLEMS  UNUSUAL RASH Items with * indicate a potential emergency and should be followed up as soon as possible.  Feel free to call the clinic you have any questions or concerns. The clinic phone number is [phone].  

## 2015-03-28 ENCOUNTER — Ambulatory Visit: Payer: Managed Care, Other (non HMO)

## 2015-03-29 ENCOUNTER — Ambulatory Visit: Payer: Managed Care, Other (non HMO)

## 2015-03-30 ENCOUNTER — Ambulatory Visit
Admission: RE | Admit: 2015-03-30 | Discharge: 2015-03-30 | Disposition: A | Discharge status: Home / Self Care | Payer: Managed Care, Other (non HMO) | Source: Ambulatory Visit | Attending: Radiation Oncology | Admitting: Radiation Oncology

## 2015-03-30 DIAGNOSIS — Z510 Encounter for antineoplastic radiation therapy: Secondary | ICD-10-CM | POA: Diagnosis not present

## 2015-03-31 ENCOUNTER — Ambulatory Visit
Admission: RE | Admit: 2015-03-31 | Discharge: 2015-03-31 | Disposition: A | Discharge status: Home / Self Care | Payer: Managed Care, Other (non HMO) | Source: Ambulatory Visit | Attending: Radiation Oncology | Admitting: Radiation Oncology

## 2015-03-31 DIAGNOSIS — Z510 Encounter for antineoplastic radiation therapy: Secondary | ICD-10-CM | POA: Diagnosis not present

## 2015-03-31 DIAGNOSIS — C50412 Malignant neoplasm of upper-outer quadrant of left female breast: Secondary | ICD-10-CM

## 2015-03-31 NOTE — Progress Notes (Signed)
Weekly assessment of radiation to left breast.Completed 16 of 33 treatments.Denies pain.Skin mild red.No peeling.No questions or concerns.

## 2015-03-31 NOTE — Progress Notes (Signed)
Weekly Management Note Current Dose:  28.8 Gy  Projected Dose: 61 Gy   Narrative:  The patient presents for routine under treatment assessment.  CBCT/MVCT images/Port film x-rays were reviewed.  The chart was checked. Doing ok. Skin pink. Using radiaplex.  Physical Findings: Weight:  . Unchanged. Pink skin.   Impression:  The patient is tolerating radiation.  Plan:  Continue treatment as planned. Continue radiaplex.

## 2015-04-01 ENCOUNTER — Ambulatory Visit
Admission: RE | Admit: 2015-04-01 | Discharge: 2015-04-01 | Disposition: A | Discharge status: Home / Self Care | Payer: Managed Care, Other (non HMO) | Source: Ambulatory Visit | Attending: Radiation Oncology | Admitting: Radiation Oncology

## 2015-04-01 DIAGNOSIS — Z510 Encounter for antineoplastic radiation therapy: Secondary | ICD-10-CM | POA: Diagnosis not present

## 2015-04-02 ENCOUNTER — Ambulatory Visit
Admission: RE | Admit: 2015-04-02 | Discharge: 2015-04-02 | Disposition: A | Discharge status: Home / Self Care | Payer: Managed Care, Other (non HMO) | Source: Ambulatory Visit | Attending: Radiation Oncology | Admitting: Radiation Oncology

## 2015-04-02 DIAGNOSIS — Z510 Encounter for antineoplastic radiation therapy: Secondary | ICD-10-CM | POA: Diagnosis not present

## 2015-04-03 ENCOUNTER — Ambulatory Visit
Admission: RE | Admit: 2015-04-03 | Discharge: 2015-04-03 | Disposition: A | Discharge status: Home / Self Care | Payer: Managed Care, Other (non HMO) | Source: Ambulatory Visit | Attending: Radiation Oncology | Admitting: Radiation Oncology

## 2015-04-03 DIAGNOSIS — Z510 Encounter for antineoplastic radiation therapy: Secondary | ICD-10-CM | POA: Diagnosis not present

## 2015-04-04 ENCOUNTER — Ambulatory Visit: Payer: Managed Care, Other (non HMO)

## 2015-04-05 ENCOUNTER — Ambulatory Visit: Payer: Managed Care, Other (non HMO)

## 2015-04-06 ENCOUNTER — Ambulatory Visit: Payer: Managed Care, Other (non HMO)

## 2015-04-07 ENCOUNTER — Ambulatory Visit
Admission: RE | Admit: 2015-04-07 | Discharge: 2015-04-07 | Disposition: A | Discharge status: Home / Self Care | Payer: Managed Care, Other (non HMO) | Source: Ambulatory Visit | Attending: Radiation Oncology | Admitting: Radiation Oncology

## 2015-04-07 ENCOUNTER — Ambulatory Visit: Payer: Managed Care, Other (non HMO)

## 2015-04-08 ENCOUNTER — Other Ambulatory Visit: Payer: Self-pay | Admitting: Hematology and Oncology

## 2015-04-08 ENCOUNTER — Ambulatory Visit
Admission: RE | Admit: 2015-04-08 | Discharge: 2015-04-08 | Disposition: A | Discharge status: Home / Self Care | Payer: Managed Care, Other (non HMO) | Source: Ambulatory Visit | Attending: Radiation Oncology | Admitting: Radiation Oncology

## 2015-04-08 DIAGNOSIS — Z510 Encounter for antineoplastic radiation therapy: Secondary | ICD-10-CM | POA: Diagnosis not present

## 2015-04-08 DIAGNOSIS — C50412 Malignant neoplasm of upper-outer quadrant of left female breast: Secondary | ICD-10-CM

## 2015-04-08 NOTE — Progress Notes (Signed)
Weekly Management Note Current Dose: 36  Gy  Projected Dose: 61 Gy   Narrative:  The patient presents for routine under treatment assessment.  CBCT/MVCT images/Port film x-rays were reviewed.  The chart was checked. Using radiaplex. Pain in inframammary fold.   Physical Findings: Weight:  . Unchanged. Moist desquamation in the inframammary fold.   Impression:  The patient is tolerating radiation.  Plan:  Continue treatment as planned. Add neosporin plus pain to ninframammry fold.

## 2015-04-09 ENCOUNTER — Ambulatory Visit
Admission: RE | Admit: 2015-04-09 | Discharge: 2015-04-09 | Disposition: A | Discharge status: Home / Self Care | Payer: Managed Care, Other (non HMO) | Source: Ambulatory Visit | Attending: Radiation Oncology | Admitting: Radiation Oncology

## 2015-04-09 DIAGNOSIS — Z510 Encounter for antineoplastic radiation therapy: Secondary | ICD-10-CM | POA: Diagnosis not present

## 2015-04-10 ENCOUNTER — Ambulatory Visit
Admission: RE | Admit: 2015-04-10 | Discharge: 2015-04-10 | Disposition: A | Discharge status: Home / Self Care | Payer: Managed Care, Other (non HMO) | Source: Ambulatory Visit | Attending: Radiation Oncology | Admitting: Radiation Oncology

## 2015-04-10 DIAGNOSIS — Z510 Encounter for antineoplastic radiation therapy: Secondary | ICD-10-CM | POA: Diagnosis not present

## 2015-04-11 ENCOUNTER — Ambulatory Visit: Payer: Managed Care, Other (non HMO)

## 2015-04-13 ENCOUNTER — Ambulatory Visit
Admission: RE | Admit: 2015-04-13 | Discharge: 2015-04-13 | Disposition: A | Discharge status: Home / Self Care | Payer: Managed Care, Other (non HMO) | Source: Ambulatory Visit | Attending: Radiation Oncology | Admitting: Radiation Oncology

## 2015-04-13 DIAGNOSIS — Z510 Encounter for antineoplastic radiation therapy: Secondary | ICD-10-CM | POA: Diagnosis not present

## 2015-04-14 ENCOUNTER — Ambulatory Visit
Admission: RE | Admit: 2015-04-14 | Discharge: 2015-04-14 | Disposition: A | Discharge status: Home / Self Care | Payer: Managed Care, Other (non HMO) | Source: Ambulatory Visit | Attending: Radiation Oncology | Admitting: Radiation Oncology

## 2015-04-14 ENCOUNTER — Encounter: Payer: Self-pay | Admitting: Radiation Oncology

## 2015-04-14 VITALS — BP 109/80 | HR 66 | Temp 98.2°F | Wt 145.9 lb

## 2015-04-14 DIAGNOSIS — C50412 Malignant neoplasm of upper-outer quadrant of left female breast: Secondary | ICD-10-CM

## 2015-04-14 DIAGNOSIS — Z510 Encounter for antineoplastic radiation therapy: Secondary | ICD-10-CM | POA: Diagnosis not present

## 2015-04-14 NOTE — Progress Notes (Signed)
Patient has completed 24 of 33 treatments to left breast.Has some discomfort unrelieved with tylenol.Patient will try ibuprofen or aleve.Has desquamation of left mammary fold.Site painted with gentian violet and telfa applied after about 5 minutes.Given telfa pads to apply.Instruced not to shower off and to wear old t-shirt at bedtime and towel.Patient to return for assessment on Friday 04/17/15 or sooner if needed.

## 2015-04-14 NOTE — Progress Notes (Addendum)
Weekly Management Note Current Dose: 43.2  Gy  Projected Dose: 61 Gy   Narrative:  The patient presents for routine under treatment assessment.  CBCT/MVCT images/Port film x-rays were reviewed.  The chart was checked. Saw on tx machine. Has moist desquamation in inframammary fold which is painful.   Physical Findings: Weight: 145 lb 14.4 oz (66.18 kg). Confluent mois tdesquamation in inframammary fold.  Impression:  The patient is tolerating radiation.  Plan:  Continue treatment as planned. Gentian violet as needed to inframammary fold. Ok to proceed with boost.

## 2015-04-14 NOTE — Addendum Note (Signed)
Encounter addended by: Thea Silversmith, MD on: 04/14/2015  9:59 AM<BR>     Documentation filed: Notes Section

## 2015-04-14 NOTE — Progress Notes (Signed)
Rec'd FMLA paperwork ([employer]) and given to Val, Dr. Unknown Jim nurse.

## 2015-04-15 ENCOUNTER — Ambulatory Visit
Admission: RE | Admit: 2015-04-15 | Discharge: 2015-04-15 | Disposition: A | Discharge status: Home / Self Care | Payer: Managed Care, Other (non HMO) | Source: Ambulatory Visit | Attending: Radiation Oncology | Admitting: Radiation Oncology

## 2015-04-15 ENCOUNTER — Other Ambulatory Visit: Payer: Self-pay | Admitting: *Deleted

## 2015-04-15 ENCOUNTER — Ambulatory Visit: Payer: Managed Care, Other (non HMO)

## 2015-04-15 DIAGNOSIS — Z510 Encounter for antineoplastic radiation therapy: Secondary | ICD-10-CM | POA: Diagnosis not present

## 2015-04-16 ENCOUNTER — Ambulatory Visit: Payer: Managed Care, Other (non HMO)

## 2015-04-16 ENCOUNTER — Ambulatory Visit
Admission: RE | Admit: 2015-04-16 | Discharge: 2015-04-16 | Disposition: A | Discharge status: Home / Self Care | Payer: Managed Care, Other (non HMO) | Source: Ambulatory Visit | Attending: Radiation Oncology | Admitting: Radiation Oncology

## 2015-04-16 ENCOUNTER — Encounter: Payer: Self-pay | Admitting: *Deleted

## 2015-04-16 ENCOUNTER — Telehealth: Payer: Self-pay | Admitting: Hematology and Oncology

## 2015-04-16 DIAGNOSIS — Z510 Encounter for antineoplastic radiation therapy: Secondary | ICD-10-CM | POA: Diagnosis not present

## 2015-04-16 DIAGNOSIS — C50412 Malignant neoplasm of upper-outer quadrant of left female breast: Secondary | ICD-10-CM

## 2015-04-16 MED ORDER — RADIAPLEXRX EX GEL
Freq: Once | CUTANEOUS | Status: AC
  Administered 2015-04-16: 09:00:00 via TOPICAL

## 2015-04-16 NOTE — Telephone Encounter (Signed)
Confirmed appointment for 06/10.

## 2015-04-17 ENCOUNTER — Ambulatory Visit
Admission: RE | Admit: 2015-04-17 | Discharge: 2015-04-17 | Disposition: A | Discharge status: Home / Self Care | Payer: Managed Care, Other (non HMO) | Source: Ambulatory Visit | Attending: Radiation Oncology | Admitting: Radiation Oncology

## 2015-04-17 ENCOUNTER — Encounter: Payer: Self-pay | Admitting: Radiation Oncology

## 2015-04-17 ENCOUNTER — Ambulatory Visit (HOSPITAL_BASED_OUTPATIENT_CLINIC_OR_DEPARTMENT_OTHER): Payer: Managed Care, Other (non HMO)

## 2015-04-17 VITALS — BP 109/70 | HR 73 | Temp 98.2°F

## 2015-04-17 DIAGNOSIS — Z510 Encounter for antineoplastic radiation therapy: Secondary | ICD-10-CM | POA: Diagnosis not present

## 2015-04-17 DIAGNOSIS — C50412 Malignant neoplasm of upper-outer quadrant of left female breast: Secondary | ICD-10-CM

## 2015-04-17 DIAGNOSIS — Z5112 Encounter for antineoplastic immunotherapy: Secondary | ICD-10-CM | POA: Diagnosis not present

## 2015-04-17 MED ORDER — TRASTUZUMAB CHEMO INJECTION 440 MG
6.0000 mg/kg | Freq: Once | INTRAVENOUS | Status: AC
  Administered 2015-04-17: 399 mg via INTRAVENOUS
  Filled 2015-04-17: qty 19

## 2015-04-17 MED ORDER — HEPARIN SOD (PORK) LOCK FLUSH 100 UNIT/ML IV SOLN
500.0000 [IU] | Freq: Once | INTRAVENOUS | Status: AC | PRN
  Administered 2015-04-17: 500 [IU]
  Filled 2015-04-17: qty 5

## 2015-04-17 MED ORDER — DIPHENHYDRAMINE HCL 25 MG PO CAPS: 50.0000 mg | ORAL_CAPSULE | Freq: Once | ORAL | Status: DC

## 2015-04-17 MED ORDER — SODIUM CHLORIDE 0.9 % IJ SOLN
10.0000 mL | INTRAMUSCULAR | Status: DC | PRN
  Administered 2015-04-17: 10 mL
  Filled 2015-04-17: qty 10

## 2015-04-17 MED ORDER — ACETAMINOPHEN 325 MG PO TABS
650.0000 mg | ORAL_TABLET | Freq: Once | ORAL | Status: AC
  Administered 2015-04-17: 650 mg via ORAL

## 2015-04-17 NOTE — Progress Notes (Signed)
Rec'd back from provider completed paperwork on [employer]. Copied/scanned and originals are given to the patient on L2 in radiation.

## 2015-04-17 NOTE — Patient Instructions (Signed)
[facility] Discharge Instructions for Patients Receiving Chemotherapy  Today you received the following chemotherapy agents Herceptin  To help prevent nausea and vomiting after your treatment, we encourage you to take your nausea medication    If you develop nausea and vomiting that is not controlled by your nausea medication, call the clinic.   BELOW ARE SYMPTOMS THAT SHOULD BE REPORTED IMMEDIATELY:  *FEVER GREATER THAN 100.5 F  *CHILLS WITH OR WITHOUT FEVER  NAUSEA AND VOMITING THAT IS NOT CONTROLLED WITH YOUR NAUSEA MEDICATION  *UNUSUAL SHORTNESS OF BREATH  *UNUSUAL BRUISING OR BLEEDING  TENDERNESS IN MOUTH AND THROAT WITH OR WITHOUT PRESENCE OF ULCERS  *URINARY PROBLEMS  *BOWEL PROBLEMS  UNUSUAL RASH Items with * indicate a potential emergency and should be followed up as soon as possible.  Feel free to call the clinic you have any questions or concerns. The clinic phone number is [phone].  Please show the CHEMO ALERT CARD at check-in to the Emergency Department and triage nurse.   

## 2015-04-20 ENCOUNTER — Ambulatory Visit
Admission: RE | Admit: 2015-04-20 | Discharge: 2015-04-20 | Disposition: A | Discharge status: Home / Self Care | Payer: Managed Care, Other (non HMO) | Source: Ambulatory Visit | Attending: Radiation Oncology | Admitting: Radiation Oncology

## 2015-04-20 ENCOUNTER — Encounter: Payer: Self-pay | Admitting: *Deleted

## 2015-04-20 DIAGNOSIS — Z510 Encounter for antineoplastic radiation therapy: Secondary | ICD-10-CM | POA: Diagnosis not present

## 2015-04-20 NOTE — Progress Notes (Signed)
Patient came to nursing c/o under inframmary fold left breast worse again, said Val put that purple paint on the other day I think I need it again", c/o hurting also, used gentiva violet on left breast under inframmary fold, telfa pad placed and mesg tube top,patient tolerated well no c/o pain when applying, will see MD tomorrow  8:46 AM

## 2015-04-21 ENCOUNTER — Ambulatory Visit
Admission: RE | Admit: 2015-04-21 | Discharge: 2015-04-21 | Disposition: A | Discharge status: Home / Self Care | Payer: Managed Care, Other (non HMO) | Source: Ambulatory Visit | Attending: Radiation Oncology | Admitting: Radiation Oncology

## 2015-04-21 ENCOUNTER — Encounter: Payer: Self-pay | Admitting: Radiation Oncology

## 2015-04-21 VITALS — BP 109/71 | HR 66 | Temp 98.0°F | Resp 20 | Wt 146.3 lb

## 2015-04-21 DIAGNOSIS — C50412 Malignant neoplasm of upper-outer quadrant of left female breast: Secondary | ICD-10-CM

## 2015-04-21 DIAGNOSIS — Z510 Encounter for antineoplastic radiation therapy: Secondary | ICD-10-CM | POA: Diagnosis not present

## 2015-04-21 NOTE — Progress Notes (Addendum)
Weekly rad tx left breast 29/33 compleetd, erythema on top mid breast, under inframmary fold dry with gentiva viloet  Painted on since yesterday, stated feels better, using radiaplex elsewhere bid 8:38 AM BP 109/71 mmHg  Pulse 66  Temp(Src) 98 F (36.7 C) (Oral)  Resp 20  Wt 146 lb 4.8 oz (66.361 kg)  Wt Readings from Last 3 Encounters:  04/21/15 146 lb 4.8 oz (66.361 kg)  04/14/15 145 lb 14.4 oz (66.18 kg)  03/27/15 142 lb (64.411 kg)

## 2015-04-21 NOTE — Progress Notes (Signed)
Weekly Management Note Current Dose: 53  Gy  Projected Dose: 61 Gy   Narrative:  The patient presents for routine under treatment assessment.  CBCT/MVCT images/Port film x-rays were reviewed.  The chart was checked. Doing well. Gentian violet helped her pain yesterday. Has  appt with med onc in a few weeks.   Physical Findings: Weight: 146 lb 4.8 oz (66.361 kg). Dark left breast. With healing moist desquamation in the inframammary fold. Gentian violet in place.   Impression:  The patient is tolerating radiation.  Plan:  Continue treatment as planned. Discussed post RT skin care. RTC in 1 month. Sooner if needed.

## 2015-04-22 ENCOUNTER — Ambulatory Visit
Admission: RE | Admit: 2015-04-22 | Discharge: 2015-04-22 | Disposition: A | Discharge status: Home / Self Care | Payer: Managed Care, Other (non HMO) | Source: Ambulatory Visit | Attending: Radiation Oncology | Admitting: Radiation Oncology

## 2015-04-22 DIAGNOSIS — Z510 Encounter for antineoplastic radiation therapy: Secondary | ICD-10-CM | POA: Diagnosis not present

## 2015-04-23 ENCOUNTER — Ambulatory Visit
Admission: RE | Admit: 2015-04-23 | Discharge: 2015-04-23 | Disposition: A | Discharge status: Home / Self Care | Payer: Managed Care, Other (non HMO) | Source: Ambulatory Visit | Attending: Radiation Oncology | Admitting: Radiation Oncology

## 2015-04-23 ENCOUNTER — Telehealth: Payer: Self-pay

## 2015-04-23 DIAGNOSIS — Z510 Encounter for antineoplastic radiation therapy: Secondary | ICD-10-CM | POA: Diagnosis not present

## 2015-04-23 NOTE — Telephone Encounter (Signed)
LMOVM - scheduled for ECHO 6/24 9 am at [facility].  Pt to call clinic if she has any questions.

## 2015-04-24 ENCOUNTER — Ambulatory Visit
Admission: RE | Admit: 2015-04-24 | Discharge: 2015-04-24 | Disposition: A | Discharge status: Home / Self Care | Payer: Managed Care, Other (non HMO) | Source: Ambulatory Visit | Attending: Radiation Oncology | Admitting: Radiation Oncology

## 2015-04-24 ENCOUNTER — Ambulatory Visit: Payer: Managed Care, Other (non HMO)

## 2015-04-24 DIAGNOSIS — Z510 Encounter for antineoplastic radiation therapy: Secondary | ICD-10-CM | POA: Diagnosis not present

## 2015-04-27 ENCOUNTER — Ambulatory Visit
Admission: RE | Admit: 2015-04-27 | Discharge: 2015-04-27 | Disposition: A | Discharge status: Home / Self Care | Payer: Managed Care, Other (non HMO) | Source: Ambulatory Visit | Attending: Radiation Oncology | Admitting: Radiation Oncology

## 2015-04-27 ENCOUNTER — Ambulatory Visit: Payer: Managed Care, Other (non HMO)

## 2015-04-27 ENCOUNTER — Encounter: Payer: Self-pay | Admitting: Radiation Oncology

## 2015-04-27 DIAGNOSIS — Z510 Encounter for antineoplastic radiation therapy: Secondary | ICD-10-CM | POA: Diagnosis not present

## 2015-04-29 ENCOUNTER — Ambulatory Visit: Payer: Managed Care, Other (non HMO)

## 2015-05-01 ENCOUNTER — Ambulatory Visit (HOSPITAL_COMMUNITY): Admission: RE | Admit: 2015-05-01 | Payer: Managed Care, Other (non HMO) | Source: Ambulatory Visit

## 2015-05-04 NOTE — Progress Notes (Signed)
  Radiation Oncology         [phone] ________________________________  Name: Wilbert Schouten MRN: [id number]  Date: 04/27/2015  DOB: Aug 21, 1976  End of Treatment Note  Diagnosis:   Breast cancer of upper-outer quadrant of left female breast   Staging form: Breast, AJCC 7th Edition     Clinical: Stage IIA (T2, N0, cM0) - Unsigned       Staging comments: Staged at breast conference on 10.21.15      Pathologic: No stage assigned - Unsigned      Indication for treatment:  Curative      Radiation treatment dates:   03/10/2015-04/27/2015  Site/dose:   Left breast/ 45 Gy at 1.8 Gy per fraction x 25 fractions.  Left breast boost/ 16 Gy at 2 Gy per fraction x 8 fractions  Beams/energy:  Opposed tangents with reduced fields / 6 MV photons Enface electrons / 15 MeV  Narrative: The patient tolerated radiation treatment relatively well.   She unfortunately developed moist desquamation in the inframammary folds which was treated with gentian violet. She continued to work and travel during treatment.   Plan: The patient has completed radiation treatment. The patient will return to radiation oncology clinic for routine followup in one month. I advised them to call or return sooner if they have any questions or concerns related to their recovery or treatment.  ------------------------------------------------  Thea Silversmith, MD

## 2015-05-06 ENCOUNTER — Telehealth: Payer: Self-pay

## 2015-05-06 ENCOUNTER — Telehealth: Payer: Self-pay | Admitting: Hematology and Oncology

## 2015-05-06 NOTE — Telephone Encounter (Signed)
Called and left a message with new appointments per pof  anne °

## 2015-05-06 NOTE — Telephone Encounter (Signed)
Returned Kim Armstrong call re: appt change.  Kim Armstrong reports she will out of town through Sat 2nd and cannot make her appt on 7/1.  Let Kim Armstrong know because of holiday, infusion room is full - books closed T-Thurs.  Let Kim Armstrong know I would check with sch and Dr. Lindi Adie and would call her with definite time and date.  Kim Armstrong voiced understanding.

## 2015-05-08 ENCOUNTER — Ambulatory Visit: Payer: Managed Care, Other (non HMO) | Admitting: Hematology and Oncology

## 2015-05-08 ENCOUNTER — Ambulatory Visit: Payer: Managed Care, Other (non HMO)

## 2015-05-11 NOTE — Progress Notes (Addendum)
Name: Kim Armstrong   MRN: [id number]  Date:  04/02/15   DOB: 1976/02/07  Status:outpatient    DIAGNOSIS: Breast cancer of upper-outer quadrant of left female breast   Staging form: Breast, AJCC 7th Edition     Clinical: Stage IIA (T2, N0, cM0) - Unsigned       Staging comments: Staged at breast conference on 10.21.15      Pathologic: No stage assigned - Unsigned   CONSENT VERIFIED: yes   SET UP: Patient is setup supine   IMMOBILIZATION:  The following immobilization was used:Custom Moldable Pillow, breast board.   NARRATIVE: [name] underwent complex simulation and treatment planning for her boost treatment today.  Her tumor volume was outlined on the planning CT scan. The depth of her cavity was felt to be appropriate for treatment with electrons    15  MeV electrons will be prescribed to the 100%  isodose line.   I personally oversaw and approved the construction of a unique block which will be used for beam modification purposes.  An isodose plan is requested.

## 2015-05-15 ENCOUNTER — Ambulatory Visit (HOSPITAL_BASED_OUTPATIENT_CLINIC_OR_DEPARTMENT_OTHER): Payer: Managed Care, Other (non HMO)

## 2015-05-15 VITALS — BP 118/68 | HR 62 | Temp 98.2°F | Resp 16

## 2015-05-15 DIAGNOSIS — Z5112 Encounter for antineoplastic immunotherapy: Secondary | ICD-10-CM

## 2015-05-15 DIAGNOSIS — C50412 Malignant neoplasm of upper-outer quadrant of left female breast: Secondary | ICD-10-CM

## 2015-05-15 MED ORDER — HEPARIN SOD (PORK) LOCK FLUSH 100 UNIT/ML IV SOLN
500.0000 [IU] | Freq: Once | INTRAVENOUS | Status: AC | PRN
  Administered 2015-05-15: 500 [IU]
  Filled 2015-05-15: qty 5

## 2015-05-15 MED ORDER — SODIUM CHLORIDE 0.9 % IV SOLN
Freq: Once | INTRAVENOUS | Status: AC
  Administered 2015-05-15: 14:00:00 via INTRAVENOUS

## 2015-05-15 MED ORDER — TRASTUZUMAB CHEMO INJECTION 440 MG
6.0000 mg/kg | Freq: Once | INTRAVENOUS | Status: AC
  Administered 2015-05-15: 399 mg via INTRAVENOUS
  Filled 2015-05-15: qty 19

## 2015-05-15 MED ORDER — ACETAMINOPHEN 325 MG PO TABS
650.0000 mg | ORAL_TABLET | Freq: Once | ORAL | Status: AC
  Administered 2015-05-15: 650 mg via ORAL

## 2015-05-15 MED ORDER — DIPHENHYDRAMINE HCL 25 MG PO CAPS: 50.0000 mg | ORAL_CAPSULE | Freq: Once | ORAL | Status: DC

## 2015-05-15 MED ORDER — SODIUM CHLORIDE 0.9 % IJ SOLN
10.0000 mL | INTRAMUSCULAR | Status: DC | PRN
  Administered 2015-05-15: 10 mL
  Filled 2015-05-15: qty 10

## 2015-05-20 ENCOUNTER — Ambulatory Visit (HOSPITAL_COMMUNITY)
Admission: RE | Admit: 2015-05-20 | Discharge: 2015-05-20 | Disposition: A | Discharge status: Home / Self Care | Payer: Managed Care, Other (non HMO) | Source: Ambulatory Visit | Attending: Hematology and Oncology | Admitting: Hematology and Oncology

## 2015-05-20 DIAGNOSIS — C50412 Malignant neoplasm of upper-outer quadrant of left female breast: Secondary | ICD-10-CM | POA: Diagnosis not present

## 2015-05-20 DIAGNOSIS — Z87891 Personal history of nicotine dependence: Secondary | ICD-10-CM | POA: Diagnosis not present

## 2015-05-20 DIAGNOSIS — Z9221 Personal history of antineoplastic chemotherapy: Secondary | ICD-10-CM | POA: Insufficient documentation

## 2015-05-20 NOTE — Progress Notes (Signed)
*  PRELIMINARY RESULTS* Echocardiogram 2D Echocardiogram limited has been performed.  Leavy Cella 05/20/2015, 10:01 AM

## 2015-06-04 NOTE — Assessment & Plan Note (Signed)
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  Pathology: Lumpectomy at [facility] on 01/29/2015: residual microscopic invasive ductal carcinoma spanning 3.2 cm T2 N0 M0 Pathological stage II a  Treatment summary: Neoadjuvant chemotherapy with Taxotere, carboplatin, Herceptin and Perjeta given once every 3 weeks from 09/19/2014 to 01/02/2015 . Cardiac monitoring: echocardiogram October 2015 EF 55%; 01/06/2015 EF 60-65%; 05/20/2015 EF 60-65% Chemotherapy related toxicities: Fatigue, alopecia, grade 1 neuropathy, nausea and vomiting grade 1, scalp hair folliculitis, grade 1 anemia.  Current treatment:  1. Herceptin every 3 weeks 2. Adjuvant radiation therapy completed June 2016  Plan: 1. Anti-estrogen therapy with tamoxifen/AI +/- ovarian suppression (with consideration for PALLAS clinical trial)  Return to clinic in 6 weeks for follow-up

## 2015-06-05 ENCOUNTER — Ambulatory Visit (HOSPITAL_BASED_OUTPATIENT_CLINIC_OR_DEPARTMENT_OTHER): Payer: Managed Care, Other (non HMO) | Admitting: Hematology and Oncology

## 2015-06-05 ENCOUNTER — Ambulatory Visit (HOSPITAL_BASED_OUTPATIENT_CLINIC_OR_DEPARTMENT_OTHER): Payer: Managed Care, Other (non HMO)

## 2015-06-05 ENCOUNTER — Encounter: Payer: Self-pay | Admitting: Hematology and Oncology

## 2015-06-05 ENCOUNTER — Other Ambulatory Visit (HOSPITAL_BASED_OUTPATIENT_CLINIC_OR_DEPARTMENT_OTHER): Payer: Managed Care, Other (non HMO)

## 2015-06-05 ENCOUNTER — Telehealth: Payer: Self-pay | Admitting: Hematology and Oncology

## 2015-06-05 VITALS — BP 107/73 | HR 72 | Temp 98.1°F | Resp 18 | Ht 67.0 in | Wt 145.5 lb

## 2015-06-05 DIAGNOSIS — Z170 Estrogen receptor positive status [ER+]: Secondary | ICD-10-CM

## 2015-06-05 DIAGNOSIS — G629 Polyneuropathy, unspecified: Secondary | ICD-10-CM

## 2015-06-05 DIAGNOSIS — C50412 Malignant neoplasm of upper-outer quadrant of left female breast: Secondary | ICD-10-CM

## 2015-06-05 DIAGNOSIS — Z5112 Encounter for antineoplastic immunotherapy: Secondary | ICD-10-CM

## 2015-06-05 DIAGNOSIS — D649 Anemia, unspecified: Secondary | ICD-10-CM | POA: Diagnosis not present

## 2015-06-05 DIAGNOSIS — R112 Nausea with vomiting, unspecified: Secondary | ICD-10-CM

## 2015-06-05 LAB — CBC WITH DIFFERENTIAL/PLATELET
BASO%: 0.6 % (ref 0.0–2.0)
Basophils Absolute: 0 10*3/uL (ref 0.0–0.1)
EOS%: 5.8 % (ref 0.0–7.0)
Eosinophils Absolute: 0.2 10*3/uL (ref 0.0–0.5)
HCT: 36.2 % (ref 34.8–46.6)
HGB: 12.4 g/dL (ref 11.6–15.9)
LYMPH%: 23 % (ref 14.0–49.7)
MCH: 31.2 pg (ref 25.1–34.0)
MCHC: 34.3 g/dL (ref 31.5–36.0)
MCV: 91 fL (ref 79.5–101.0)
MONO#: 0.3 10*3/uL (ref 0.1–0.9)
MONO%: 8.3 % (ref 0.0–14.0)
NEUT#: 2 10*3/uL (ref 1.5–6.5)
NEUT%: 62.3 % (ref 38.4–76.8)
Platelets: 181 10*3/uL (ref 145–400)
RBC: 3.98 10*6/uL (ref 3.70–5.45)
RDW: 13.2 % (ref 11.2–14.5)
WBC: 3.1 10*3/uL — ABNORMAL LOW (ref 3.9–10.3)
lymph#: 0.7 10*3/uL — ABNORMAL LOW (ref 0.9–3.3)

## 2015-06-05 LAB — COMPREHENSIVE METABOLIC PANEL (CC13)
ALT: 11 U/L (ref 0–55)
AST: 15 U/L (ref 5–34)
Albumin: 3.8 g/dL (ref 3.5–5.0)
Alkaline Phosphatase: 54 U/L (ref 40–150)
Anion Gap: 6 mEq/L (ref 3–11)
BUN: 12.3 mg/dL (ref 7.0–26.0)
CO2: 24 mEq/L (ref 22–29)
Calcium: 8.9 mg/dL (ref 8.4–10.4)
Chloride: 110 mEq/L — ABNORMAL HIGH (ref 98–109)
Creatinine: 0.8 mg/dL (ref 0.6–1.1)
EGFR: >90 mL/min/{1.73_m2} (ref >90)
Glucose: 98 mg/dl (ref 70–140)
Potassium: 4.2 mEq/L (ref 3.5–5.1)
Sodium: 140 mEq/L (ref 136–145)
Total Bilirubin: <0.2 mg/dL (ref 0.20–1.20)
Total Protein: 6.6 g/dL (ref 6.4–8.3)

## 2015-06-05 MED ORDER — TAMOXIFEN CITRATE 20 MG PO TABS: 20.0000 mg | ORAL_TABLET | Freq: Every day | ORAL | Status: DC

## 2015-06-05 MED ORDER — SODIUM CHLORIDE 0.9 % IV SOLN
Freq: Once | INTRAVENOUS | Status: AC
  Administered 2015-06-05: 10:00:00 via INTRAVENOUS

## 2015-06-05 MED ORDER — TRASTUZUMAB CHEMO INJECTION 440 MG
6.0000 mg/kg | Freq: Once | INTRAVENOUS | Status: AC
  Administered 2015-06-05: 399 mg via INTRAVENOUS
  Filled 2015-06-05: qty 19

## 2015-06-05 MED ORDER — SODIUM CHLORIDE 0.9 % IJ SOLN
10.0000 mL | INTRAMUSCULAR | Status: DC | PRN
  Administered 2015-06-05: 10 mL
  Filled 2015-06-05: qty 10

## 2015-06-05 MED ORDER — ACETAMINOPHEN 325 MG PO TABS
650.0000 mg | ORAL_TABLET | Freq: Once | ORAL | Status: AC
  Administered 2015-06-05: 650 mg via ORAL

## 2015-06-05 MED ORDER — HEPARIN SOD (PORK) LOCK FLUSH 100 UNIT/ML IV SOLN
500.0000 [IU] | Freq: Once | INTRAVENOUS | Status: AC | PRN
  Administered 2015-06-05: 500 [IU]
  Filled 2015-06-05: qty 5

## 2015-06-05 NOTE — Patient Instructions (Signed)
[facility] Discharge Instructions for Patients Receiving Chemotherapy  Today you received the following biotherapy agents: Herceptin.  BELOW ARE SYMPTOMS THAT SHOULD BE REPORTED IMMEDIATELY:  *FEVER GREATER THAN 100.5 F  *CHILLS WITH OR WITHOUT FEVER  NAUSEA AND VOMITING THAT IS NOT CONTROLLED WITH YOUR NAUSEA MEDICATION  *UNUSUAL SHORTNESS OF BREATH  *UNUSUAL BRUISING OR BLEEDING  TENDERNESS IN MOUTH AND THROAT WITH OR WITHOUT PRESENCE OF ULCERS  *URINARY PROBLEMS  *BOWEL PROBLEMS  UNUSUAL RASH Items with * indicate a potential emergency and should be followed up as soon as possible.  Feel free to call the clinic should you have any questions or concerns. The clinic phone number is [phone].  Please show the CHEMO ALERT CARD at check-in to the Emergency Department and triage nurse.   

## 2015-06-05 NOTE — Progress Notes (Signed)
Patient Care Team: Thurnell Lose, MD as PCP - General (Obstetrics and Gynecology) Thurnell Lose, MD (Obstetrics and Gynecology) Erroll Luna, MD as Consulting Physician (General Surgery) Nicholas Lose, MD as Consulting Physician (Hematology and Oncology) Thea Silversmith, MD as Consulting Physician (Radiation Oncology) Erroll Luna, MD as Consulting Physician (General Surgery) Nicholas Lose, MD as Consulting Physician (Hematology and Oncology) Thea Silversmith, MD as Consulting Physician (Radiation Oncology)  DIAGNOSIS: Breast cancer of upper-outer quadrant of left female breast   Staging form: Breast, AJCC 7th Edition     Clinical: Stage IIA (T2, N0, cM0) - Unsigned       Staging comments: Staged at breast conference on 10.21.15      Pathologic: No stage assigned - Unsigned   SUMMARY OF ONCOLOGIC HISTORY:   Breast cancer of upper-outer quadrant of left female breast   08/20/2014 Initial Diagnosis Invasive ductal carcinoma grade 3, ER PR positive HER-2 positive ratio 3.5, Ki-67 25%; biopsy of satellite lesion fibroadenoma   08/26/2014 Breast MRI Left breast: Upper-outer quadrant 1.4 cm from nipple 1.9 x 2.5 x 2.6 cm enhancing mass with the 8 mm nodule 1 cm posterior Right breast: 1.4 x 1.6 cm mass upper outer quadrant biopsy proven fibroadenoma:   09/19/2014 -  Neo-Adjuvant Chemotherapy Neoadjuvant chemotherapy with Taxotere, carboplatin, Herceptin and Perjeta x6 cycles followed by Herceptin maintenance to be complete November 2016   01/09/2015 Breast MRI Interval marked response to chemotherapy with significant decrease in the size of the tumor, satellite nodule is now vaguely visible, 2.6 cm mass is now 0.9 cm   01/29/2015 Surgery Left breast lumpectomy by Dr. Ernst Bowler at [facility]; 3.2 cm area of microscopic cells, 2 sentinel lymph nodes negative T2 N0 M0 stage II a   03/10/2015 - 04/27/2015 Radiation Therapy Adjuvant radiation   06/05/2015 -  Anti-estrogen oral therapy Tamoxifen 20  mg daily    CHIEF COMPLIANT: Follow-up on Herceptin  INTERVAL HISTORY: Kim Armstrong is a 39 year old with above-mentioned history of left breast cancer treated with neo-adjuvant chemotherapy. She completed radiation therapy and is here today to discuss the antiestrogen therapy plan. We have discussed tamoxifen as well as ovarian suppression with aromatase inhibitors. She elected to just stop tamoxifen alone. She recently had a trip to [place] where she spent a week mountain climbing and [employer].  REVIEW OF SYSTEMS:   Constitutional: Denies fevers, chills or abnormal weight loss Eyes: Denies blurriness of vision Ears, nose, mouth, throat, and face: Denies mucositis or sore throat Respiratory: Denies cough, dyspnea or wheezes Cardiovascular: Denies palpitation, chest discomfort or lower extremity swelling Gastrointestinal:  Denies nausea, heartburn or change in bowel habits Skin: Denies abnormal skin rashes Lymphatics: Denies new lymphadenopathy or easy bruising Neurological:Denies numbness, tingling or new weaknesses Behavioral/Psych: Mood is stable, no new changes  Breast:  denies any pain or lumps or nodules in either breasts All other systems were reviewed with the patient and are negative.  I have reviewed the past medical history, past surgical history, social history and family history with the patient and they are unchanged from previous note.  ALLERGIES:  has No Known Allergies.  MEDICATIONS:  Current Outpatient Prescriptions  Medication Sig Dispense Refill  . acetaminophen (TYLENOL) 500 MG tablet Take 1,000 mg by mouth every 6 (six) hours as needed for mild pain or moderate pain.    Marland Kitchen ALPRAZolam (XANAX) 0.25 MG tablet Take 0.25 mg by mouth at bedtime as needed for anxiety (and for flying).     . diphenhydramine-acetaminophen (TYLENOL  PM) 25-500 MG TABS Take 1 tablet by mouth at bedtime as needed.    . docusate sodium (COLACE) 100 MG capsule Take 100 mg by  mouth 2 (two) times daily.    . fluconazole (DIFLUCAN) 100 MG tablet Take 1 tablet (100 mg total) by mouth daily. (Patient not taking: Reported on 04/21/2015) 10 tablet 0  . hyaluronate sodium (RADIAPLEXRX) GEL Apply 1 application topically 2 (two) times daily.    Marland Kitchen isometheptene-acetaminophen-dichloralphenazone (MIDRIN) 65-325-100 MG capsule Take 1-2 capsules by mouth 4 (four) times daily as needed for migraine.     . lidocaine-prilocaine (EMLA) cream Apply to affected area once 30 g 3  . LORazepam (ATIVAN) 0.5 MG tablet Take 1 tablet (0.5 mg total) by mouth every 6 (six) hours as needed (Nausea or vomiting). 30 tablet 0  . tamoxifen (NOLVADEX) 20 MG tablet Take 1 tablet (20 mg total) by mouth daily. 90 tablet 3  . tranexamic acid (LYSTEDA) 650 MG TABS tablet TAKE 2 TABLETS THREE TIMES DAILY ONLY DURING MENSES. NOT TO EXCEED 5 DAYS. ORALLY 30 DAYS  0  . venlafaxine XR (EFFEXOR-XR) 37.5 MG 24 hr capsule Take 1 capsule (37.5 mg total) by mouth daily with breakfast. 30 capsule 5   No current facility-administered medications for this visit.   Facility-Administered Medications Ordered in Other Visits  Medication Dose Route Frequency Provider Last Rate Last Dose  . diphenhydrAMINE (BENADRYL) capsule 50 mg  50 mg Oral Once Nicholas Lose, MD   50 mg at 04/17/15 1527  . sodium chloride 0.9 % injection 10 mL  10 mL Intracatheter PRN Nicholas Lose, MD   10 mL at 04/17/15 1527    PHYSICAL EXAMINATION: ECOG PERFORMANCE STATUS: 0 - Asymptomatic  Filed Vitals:   06/05/15 0842  BP: 107/73  Pulse: 72  Temp: 98.1 F (36.7 C)  Resp: 18   Filed Weights   06/05/15 0842  Weight: 145 lb 8 oz (65.998 kg)    GENERAL:alert, no distress and comfortable SKIN: skin color, texture, turgor are normal, no rashes or significant lesions EYES: normal, Conjunctiva are pink and non-injected, sclera clear OROPHARYNX:no exudate, no erythema and lips, buccal mucosa, and tongue normal  NECK: supple, thyroid normal size,  non-tender, without nodularity LYMPH:  no palpable lymphadenopathy in the cervical, axillary or inguinal LUNGS: clear to auscultation and percussion with normal breathing effort HEART: regular rate & rhythm and no murmurs and no lower extremity edema ABDOMEN:abdomen soft, non-tender and normal bowel sounds Musculoskeletal:no cyanosis of digits and no clubbing  NEURO: alert & oriented x 3 with fluent speech, no focal motor/sensory deficits  LABORATORY DATA:  I have reviewed the data as listed   Chemistry      Component Value Date/Time   NA 140 06/05/2015 0826   K 4.2 06/05/2015 0826   CO2 24 06/05/2015 0826   BUN 12.3 06/05/2015 0826   CREATININE 0.8 06/05/2015 0826      Component Value Date/Time   CALCIUM 8.9 06/05/2015 0826   ALKPHOS 54 06/05/2015 0826   AST 15 06/05/2015 0826   ALT 11 06/05/2015 0826   BILITOT <0.20 06/05/2015 0826       Lab Results  Component Value Date   WBC 3.1* 06/05/2015   HGB 12.4 06/05/2015   HCT 36.2 06/05/2015   MCV 91.0 06/05/2015   PLT 181 06/05/2015   NEUTROABS 2.0 06/05/2015   ASSESSMENT & PLAN:  Breast cancer of upper-outer quadrant of left female breast Left breast invasive ductal carcinoma: ER/PR positive HER-2 positive  Ki-67 of 25 percent: T2, N0, M0 clinical stage II A; 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  Pathology: Lumpectomy at [facility] on 01/29/2015: residual microscopic invasive ductal carcinoma spanning 3.2 cm T2 N0 M0 Pathological stage II a  Treatment summary: Neoadjuvant chemotherapy with Taxotere, carboplatin, Herceptin and Perjeta given once every 3 weeks from 09/19/2014 to 01/02/2015 . Cardiac monitoring: echocardiogram October 2015 EF 55%; 01/06/2015 EF 60-65%; 05/20/2015 EF 60-65% Chemotherapy related toxicities: Fatigue, alopecia, grade 1 neuropathy, nausea and vomiting grade 1, scalp hair folliculitis, grade 1 anemia.  Current treatment:  1. Herceptin every 3 weeks 2. Adjuvant radiation  therapy completed June 2016  Plan: 1. Anti-estrogen therapy with tamoxifen/AI +/- ovarian suppression (with consideration for PALLAS clinical trial) After much discussion we elected to use tamoxifen. We will see her back to assess tolerability to treatment.  Tamoxifen counseling: We discussed the risks and benefits of tamoxifen. These include but not limited to insomnia, hot flashes, mood changes, vaginal dryness, and weight gain. Although rare, serious side effects including endometrial cancer, risk of blood clots were also discussed. We strongly believe that the benefits far outweigh the risks. Patient understands these risks and consented to starting treatment. Planned treatment duration is 5-10 years.   Return to clinic in 6 weeks for follow-up    No orders of the defined types were placed in this encounter.   The patient has a good understanding of the overall plan. she agrees with it. she will call with any problems that may develop before the next visit here.   Rulon Eisenmenger, MD

## 2015-06-05 NOTE — Telephone Encounter (Signed)
Appointments made and avs will be printed in chemo   anne

## 2015-06-26 ENCOUNTER — Ambulatory Visit (HOSPITAL_BASED_OUTPATIENT_CLINIC_OR_DEPARTMENT_OTHER): Payer: Managed Care, Other (non HMO)

## 2015-06-26 ENCOUNTER — Telehealth: Payer: Self-pay | Admitting: Hematology and Oncology

## 2015-06-26 VITALS — BP 105/72 | HR 63 | Temp 98.5°F | Resp 16

## 2015-06-26 DIAGNOSIS — C50412 Malignant neoplasm of upper-outer quadrant of left female breast: Secondary | ICD-10-CM | POA: Diagnosis not present

## 2015-06-26 DIAGNOSIS — Z5112 Encounter for antineoplastic immunotherapy: Secondary | ICD-10-CM | POA: Diagnosis not present

## 2015-06-26 MED ORDER — HEPARIN SOD (PORK) LOCK FLUSH 100 UNIT/ML IV SOLN
500.0000 [IU] | Freq: Once | INTRAVENOUS | Status: AC | PRN
  Administered 2015-06-26: 500 [IU]
  Filled 2015-06-26: qty 5

## 2015-06-26 MED ORDER — ACETAMINOPHEN 325 MG PO TABS
650.0000 mg | ORAL_TABLET | Freq: Once | ORAL | Status: AC
  Administered 2015-06-26: 650 mg via ORAL

## 2015-06-26 MED ORDER — TRASTUZUMAB CHEMO INJECTION 440 MG
6.0000 mg/kg | Freq: Once | INTRAVENOUS | Status: AC
  Administered 2015-06-26: 399 mg via INTRAVENOUS
  Filled 2015-06-26: qty 19

## 2015-06-26 MED ORDER — SODIUM CHLORIDE 0.9 % IV SOLN
Freq: Once | INTRAVENOUS | Status: AC
  Administered 2015-06-26: 10:00:00 via INTRAVENOUS

## 2015-06-26 MED ORDER — SODIUM CHLORIDE 0.9 % IJ SOLN
10.0000 mL | INTRAMUSCULAR | Status: DC | PRN
  Administered 2015-06-26: 10 mL
  Filled 2015-06-26: qty 10

## 2015-06-26 NOTE — Telephone Encounter (Signed)
Called and left a message with new appointment due to meeting 9/9   Kim Armstrong

## 2015-06-26 NOTE — Patient Instructions (Signed)
[facility] Discharge Instructions for Patients Receiving Chemotherapy  Today you received the following chemotherapy agents: Herceptin   To help prevent nausea and vomiting after your treatment, we encourage you to take your nausea medication as directed.    If you develop nausea and vomiting that is not controlled by your nausea medication, call the clinic.   BELOW ARE SYMPTOMS THAT SHOULD BE REPORTED IMMEDIATELY:  *FEVER GREATER THAN 100.5 F  *CHILLS WITH OR WITHOUT FEVER  NAUSEA AND VOMITING THAT IS NOT CONTROLLED WITH YOUR NAUSEA MEDICATION  *UNUSUAL SHORTNESS OF BREATH  *UNUSUAL BRUISING OR BLEEDING  TENDERNESS IN MOUTH AND THROAT WITH OR WITHOUT PRESENCE OF ULCERS  *URINARY PROBLEMS  *BOWEL PROBLEMS  UNUSUAL RASH Items with * indicate a potential emergency and should be followed up as soon as possible.  Feel free to call the clinic you have any questions or concerns. The clinic phone number is [phone].  Please show the CHEMO ALERT CARD at check-in to the Emergency Department and triage nurse.   

## 2015-07-16 ENCOUNTER — Ambulatory Visit (HOSPITAL_BASED_OUTPATIENT_CLINIC_OR_DEPARTMENT_OTHER): Payer: Managed Care, Other (non HMO) | Admitting: Hematology and Oncology

## 2015-07-16 ENCOUNTER — Encounter: Payer: Self-pay | Admitting: Hematology and Oncology

## 2015-07-16 ENCOUNTER — Telehealth: Payer: Self-pay | Admitting: Hematology and Oncology

## 2015-07-16 ENCOUNTER — Other Ambulatory Visit: Payer: Managed Care, Other (non HMO)

## 2015-07-16 ENCOUNTER — Ambulatory Visit (HOSPITAL_BASED_OUTPATIENT_CLINIC_OR_DEPARTMENT_OTHER): Payer: Managed Care, Other (non HMO)

## 2015-07-16 VITALS — BP 101/69 | HR 69 | Temp 97.9°F | Resp 18

## 2015-07-16 VITALS — BP 113/73 | HR 67 | Temp 98.4°F | Resp 18 | Ht 67.0 in | Wt 147.0 lb

## 2015-07-16 DIAGNOSIS — C50412 Malignant neoplasm of upper-outer quadrant of left female breast: Secondary | ICD-10-CM

## 2015-07-16 DIAGNOSIS — Z5112 Encounter for antineoplastic immunotherapy: Secondary | ICD-10-CM

## 2015-07-16 MED ORDER — SODIUM CHLORIDE 0.9 % IV SOLN
Freq: Once | INTRAVENOUS | Status: AC
  Administered 2015-07-16: 10:00:00 via INTRAVENOUS

## 2015-07-16 MED ORDER — TRASTUZUMAB CHEMO INJECTION 440 MG
6.0000 mg/kg | Freq: Once | INTRAVENOUS | Status: AC
  Administered 2015-07-16: 399 mg via INTRAVENOUS
  Filled 2015-07-16: qty 19

## 2015-07-16 MED ORDER — HEPARIN SOD (PORK) LOCK FLUSH 100 UNIT/ML IV SOLN
500.0000 [IU] | Freq: Once | INTRAVENOUS | Status: AC | PRN
  Administered 2015-07-16: 500 [IU]
  Filled 2015-07-16: qty 5

## 2015-07-16 MED ORDER — DIPHENHYDRAMINE HCL 25 MG PO CAPS: 50.0000 mg | ORAL_CAPSULE | Freq: Once | ORAL | Status: DC

## 2015-07-16 MED ORDER — ACETAMINOPHEN 325 MG PO TABS
650.0000 mg | ORAL_TABLET | Freq: Once | ORAL | Status: AC
  Administered 2015-07-16: 650 mg via ORAL

## 2015-07-16 MED ORDER — SODIUM CHLORIDE 0.9 % IJ SOLN
10.0000 mL | INTRAMUSCULAR | Status: DC | PRN
  Administered 2015-07-16: 10 mL
  Filled 2015-07-16: qty 10

## 2015-07-16 NOTE — Progress Notes (Signed)
Patient Care Team: Thurnell Lose, MD as PCP - General (Obstetrics and Gynecology) Thurnell Lose, MD (Obstetrics and Gynecology) Erroll Luna, MD as Consulting Physician (General Surgery) Nicholas Lose, MD as Consulting Physician (Hematology and Oncology) Thea Silversmith, MD as Consulting Physician (Radiation Oncology) Erroll Luna, MD as Consulting Physician (General Surgery) Nicholas Lose, MD as Consulting Physician (Hematology and Oncology) Thea Silversmith, MD as Consulting Physician (Radiation Oncology)  DIAGNOSIS: Breast cancer of upper-outer quadrant of left female breast   Staging form: Breast, AJCC 7th Edition     Clinical: Stage IIA (T2, N0, cM0) - Unsigned       Staging comments: Staged at breast conference on 10.21.15      Pathologic: No stage assigned - Unsigned   SUMMARY OF ONCOLOGIC HISTORY:   Breast cancer of upper-outer quadrant of left female breast   08/20/2014 Initial Diagnosis Invasive ductal carcinoma grade 3, ER PR positive HER-2 positive ratio 3.5, Ki-67 25%; biopsy of satellite lesion fibroadenoma   08/26/2014 Breast MRI Left breast: Upper-outer quadrant 1.4 cm from nipple 1.9 x 2.5 x 2.6 cm enhancing mass with the 8 mm nodule 1 cm posterior Right breast: 1.4 x 1.6 cm mass upper outer quadrant biopsy proven fibroadenoma:   09/19/2014 -  Neo-Adjuvant Chemotherapy Neoadjuvant chemotherapy with Taxotere, carboplatin, Herceptin and Perjeta x6 cycles followed by Herceptin maintenance to be complete November 2016   01/09/2015 Breast MRI Interval marked response to chemotherapy with significant decrease in the size of the tumor, satellite nodule is now vaguely visible, 2.6 cm mass is now 0.9 cm   01/29/2015 Surgery Left breast lumpectomy by Dr. Ernst Bowler at [facility]; 3.2 cm area of microscopic cells, 2 sentinel lymph nodes negative T2 N0 M0 stage II a   03/10/2015 - 04/27/2015 Radiation Therapy Adjuvant radiation   06/05/2015 -  Anti-estrogen oral therapy Tamoxifen 20  mg daily    CHIEF COMPLIANT: Follow-up on tamoxifen therapy  INTERVAL HISTORY: Kim Armstrong is a 39 year old with above-mentioned history of left breast cancer currently on tamoxifen. She appears to be tolerating it fairly well. Denies any hot flashes or myalgias. She has recovered from the effects of prior chemotherapy surgery and radiation. Her energy levels are back to her normal. Her hair is coming back. She is continuing with Herceptin maintenance which will complete 08/28/2015.   REVIEW OF SYSTEMS:   Constitutional: Denies fevers, chills or abnormal weight loss Eyes: Denies blurriness of vision Ears, nose, mouth, throat, and face: Denies mucositis or sore throat Respiratory: Denies cough, dyspnea or wheezes Cardiovascular: Denies palpitation, chest discomfort or lower extremity swelling Gastrointestinal:  Denies nausea, heartburn or change in bowel habits Skin: Denies abnormal skin rashes Lymphatics: Denies new lymphadenopathy or easy bruising Neurological:Denies numbness, tingling or new weaknesses Behavioral/Psych: Mood is stable, no new changes  Breast:  denies any pain or lumps or nodules in either breasts All other systems were reviewed with the patient and are negative.  I have reviewed the past medical history, past surgical history, social history and family history with the patient and they are unchanged from previous note.  ALLERGIES:  has No Known Allergies.  MEDICATIONS:  Current Outpatient Prescriptions  Medication Sig Dispense Refill  . acetaminophen (TYLENOL) 500 MG tablet Take 1,000 mg by mouth every 6 (six) hours as needed for mild pain or moderate pain.    Marland Kitchen ALPRAZolam (XANAX) 0.25 MG tablet Take 0.25 mg by mouth at bedtime as needed for anxiety (and for flying).     . diphenhydramine-acetaminophen (TYLENOL PM) 25-500  MG TABS Take 1 tablet by mouth at bedtime as needed.    . docusate sodium (COLACE) 100 MG capsule Take 100 mg by mouth 2 (two) times daily.     . fluconazole (DIFLUCAN) 100 MG tablet Take 1 tablet (100 mg total) by mouth daily. (Patient not taking: Reported on 04/21/2015) 10 tablet 0  . hyaluronate sodium (RADIAPLEXRX) GEL Apply 1 application topically 2 (two) times daily.    Marland Kitchen isometheptene-acetaminophen-dichloralphenazone (MIDRIN) 65-325-100 MG capsule Take 1-2 capsules by mouth 4 (four) times daily as needed for migraine.     . lidocaine-prilocaine (EMLA) cream Apply to affected area once 30 g 3  . LORazepam (ATIVAN) 0.5 MG tablet Take 1 tablet (0.5 mg total) by mouth every 6 (six) hours as needed (Nausea or vomiting). 30 tablet 0  . tamoxifen (NOLVADEX) 20 MG tablet Take 1 tablet (20 mg total) by mouth daily. 90 tablet 3  . tranexamic acid (LYSTEDA) 650 MG TABS tablet TAKE 2 TABLETS THREE TIMES DAILY ONLY DURING MENSES. NOT TO EXCEED 5 DAYS. ORALLY 30 DAYS  0  . venlafaxine XR (EFFEXOR-XR) 37.5 MG 24 hr capsule Take 1 capsule (37.5 mg total) by mouth daily with breakfast. 30 capsule 5   No current facility-administered medications for this visit.   Facility-Administered Medications Ordered in Other Visits  Medication Dose Route Frequency Provider Last Rate Last Dose  . diphenhydrAMINE (BENADRYL) capsule 50 mg  50 mg Oral Once Nicholas Lose, MD   50 mg at 04/17/15 1527  . sodium chloride 0.9 % injection 10 mL  10 mL Intracatheter PRN Nicholas Lose, MD   10 mL at 04/17/15 1527    PHYSICAL EXAMINATION: ECOG PERFORMANCE STATUS: 1 - Symptomatic but completely ambulatory  Filed Vitals:   07/16/15 0854  BP: 113/73  Pulse: 67  Temp: 98.4 F (36.9 C)  Resp: 18   Filed Weights   07/16/15 0854  Weight: 147 lb (66.679 kg)    GENERAL:alert, no distress and comfortable SKIN: skin color, texture, turgor are normal, no rashes or significant lesions EYES: normal, Conjunctiva are pink and non-injected, sclera clear OROPHARYNX:no exudate, no erythema and lips, buccal mucosa, and tongue normal  NECK: supple, thyroid normal size,  non-tender, without nodularity LYMPH:  no palpable lymphadenopathy in the cervical, axillary or inguinal LUNGS: clear to auscultation and percussion with normal breathing effort HEART: regular rate & rhythm and no murmurs and no lower extremity edema ABDOMEN:abdomen soft, non-tender and normal bowel sounds Musculoskeletal:no cyanosis of digits and no clubbing  NEURO: alert & oriented x 3 with fluent speech, no focal motor/sensory deficits  LABORATORY DATA:  I have reviewed the data as listed   Chemistry      Component Value Date/Time   NA 140 06/05/2015 0826   K 4.2 06/05/2015 0826   CO2 24 06/05/2015 0826   BUN 12.3 06/05/2015 0826   CREATININE 0.8 06/05/2015 0826      Component Value Date/Time   CALCIUM 8.9 06/05/2015 0826   ALKPHOS 54 06/05/2015 0826   AST 15 06/05/2015 0826   ALT 11 06/05/2015 0826   BILITOT <0.20 06/05/2015 0826       Lab Results  Component Value Date   WBC 3.1* 06/05/2015   HGB 12.4 06/05/2015   HCT 36.2 06/05/2015   MCV 91.0 06/05/2015   PLT 181 06/05/2015   NEUTROABS 2.0 06/05/2015   ASSESSMENT & PLAN:  Breast cancer of upper-outer quadrant of left female breast Left breast invasive ductal carcinoma: ER/PR positive HER-2 positive Ki-67  of 25 percent: T2, N0, M0 clinical stage II A; 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  Pathology: Lumpectomy at [facility] on 01/29/2015: residual microscopic invasive ductal carcinoma spanning 3.2 cm T2 N0 M0 Pathological stage II a  Treatment summary: Neoadjuvant chemotherapy with Taxotere, carboplatin, Herceptin and Perjeta given once every 3 weeks from 09/19/2014 to 01/02/2015 .Adjuvant radiation therapy completed June 2016; started tamoxifen 06/05/2015 Cardiac monitoring: echocardiogram October 2015 EF 55%; 01/06/2015 EF 60-65%; 05/20/2015 EF 60-65% Current treatment:  1. Herceptin every 3 weeks 2. Tamoxifen 20 mg daily started 06/05/2015  Tamoxifen toxicities: Denies any hot flashes or  myalgias or any side effects from tamoxifen she does feel mildly fatigued Patient will decide on PALLAS clinical trial when she returns back to see [place] in 6 weeks  No orders of the defined types were placed in this encounter.   The patient has a good understanding of the overall plan. she agrees with it. she will call with any problems that may develop before the next visit here.   Rulon Eisenmenger, MD

## 2015-07-16 NOTE — Telephone Encounter (Signed)
Appointments made and avs will be printed in chemo  °

## 2015-07-16 NOTE — Patient Instructions (Signed)
[facility] Discharge Instructions for Patients Receiving Chemotherapy  Today you received the following biotherapy agents: Herceptin.  BELOW ARE SYMPTOMS THAT SHOULD BE REPORTED IMMEDIATELY:  *FEVER GREATER THAN 100.5 F  *CHILLS WITH OR WITHOUT FEVER  NAUSEA AND VOMITING THAT IS NOT CONTROLLED WITH YOUR NAUSEA MEDICATION  *UNUSUAL SHORTNESS OF BREATH  *UNUSUAL BRUISING OR BLEEDING  TENDERNESS IN MOUTH AND THROAT WITH OR WITHOUT PRESENCE OF ULCERS  *URINARY PROBLEMS  *BOWEL PROBLEMS  UNUSUAL RASH Items with * indicate a potential emergency and should be followed up as soon as possible.  Feel free to call the clinic should you have any questions or concerns. The clinic phone number is [phone].  Please show the CHEMO ALERT CARD at check-in to the Emergency Department and triage nurse.   

## 2015-07-16 NOTE — Assessment & Plan Note (Signed)
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  Pathology: Lumpectomy at [facility] on 01/29/2015: residual microscopic invasive ductal carcinoma spanning 3.2 cm T2 N0 M0 Pathological stage II a  Treatment summary: Neoadjuvant chemotherapy with Taxotere, carboplatin, Herceptin and Perjeta given once every 3 weeks from 09/19/2014 to 01/02/2015 .Adjuvant radiation therapy completed June 2016; started tamoxifen 06/05/2015 Cardiac monitoring: echocardiogram October 2015 EF 55%; 01/06/2015 EF 60-65%; 05/20/2015 EF 60-65% Current treatment:  1. Herceptin every 3 weeks 2. Tamoxifen 20 mg daily started 06/05/2015  Tamoxifen toxicities:  Patient did not express any interest in participating in PALLAS clinical trial.

## 2015-07-17 ENCOUNTER — Ambulatory Visit: Payer: Managed Care, Other (non HMO) | Admitting: Hematology and Oncology

## 2015-07-17 ENCOUNTER — Ambulatory Visit: Payer: Managed Care, Other (non HMO)

## 2015-07-17 ENCOUNTER — Other Ambulatory Visit: Payer: Managed Care, Other (non HMO)

## 2015-08-06 ENCOUNTER — Other Ambulatory Visit: Payer: Self-pay | Admitting: *Deleted

## 2015-08-06 DIAGNOSIS — C50412 Malignant neoplasm of upper-outer quadrant of left female breast: Secondary | ICD-10-CM

## 2015-08-07 ENCOUNTER — Ambulatory Visit (HOSPITAL_BASED_OUTPATIENT_CLINIC_OR_DEPARTMENT_OTHER): Payer: Managed Care, Other (non HMO)

## 2015-08-07 ENCOUNTER — Other Ambulatory Visit (HOSPITAL_BASED_OUTPATIENT_CLINIC_OR_DEPARTMENT_OTHER): Payer: Managed Care, Other (non HMO)

## 2015-08-07 VITALS — BP 118/81 | HR 62 | Temp 98.0°F | Resp 17 | Ht 67.0 in

## 2015-08-07 DIAGNOSIS — Z5112 Encounter for antineoplastic immunotherapy: Secondary | ICD-10-CM | POA: Diagnosis not present

## 2015-08-07 DIAGNOSIS — C50412 Malignant neoplasm of upper-outer quadrant of left female breast: Secondary | ICD-10-CM

## 2015-08-07 LAB — CBC WITH DIFFERENTIAL/PLATELET
BASO%: 0.5 % (ref 0.0–2.0)
Basophils Absolute: 0 10*3/uL (ref 0.0–0.1)
EOS%: 3.6 % (ref 0.0–7.0)
Eosinophils Absolute: 0.1 10*3/uL (ref 0.0–0.5)
HCT: 35.2 % (ref 34.8–46.6)
HGB: 11.9 g/dL (ref 11.6–15.9)
LYMPH%: 24.1 % (ref 14.0–49.7)
MCH: 31 pg (ref 25.1–34.0)
MCHC: 33.8 g/dL (ref 31.5–36.0)
MCV: 91.7 fL (ref 79.5–101.0)
MONO#: 0.3 10*3/uL (ref 0.1–0.9)
MONO%: 8.1 % (ref 0.0–14.0)
NEUT#: 2 10*3/uL (ref 1.5–6.5)
NEUT%: 63.7 % (ref 38.4–76.8)
Platelets: 189 10*3/uL (ref 145–400)
RBC: 3.83 10*6/uL (ref 3.70–5.45)
RDW: 12.2 % (ref 11.2–14.5)
WBC: 3.1 10*3/uL — ABNORMAL LOW (ref 3.9–10.3)
lymph#: 0.8 10*3/uL — ABNORMAL LOW (ref 0.9–3.3)

## 2015-08-07 LAB — COMPREHENSIVE METABOLIC PANEL (CC13)
ALT: <9 U/L (ref 0–55)
AST: 12 U/L (ref 5–34)
Albumin: 3.8 g/dL (ref 3.5–5.0)
Alkaline Phosphatase: 41 U/L (ref 40–150)
Anion Gap: 6 mEq/L (ref 3–11)
BUN: 11.1 mg/dL (ref 7.0–26.0)
CO2: 24 mEq/L (ref 22–29)
Calcium: 9.1 mg/dL (ref 8.4–10.4)
Chloride: 110 mEq/L — ABNORMAL HIGH (ref 98–109)
Creatinine: 0.9 mg/dL (ref 0.6–1.1)
EGFR: 85 mL/min/{1.73_m2} — ABNORMAL LOW (ref >90)
Glucose: 94 mg/dl (ref 70–140)
Potassium: 4.2 mEq/L (ref 3.5–5.1)
Sodium: 141 mEq/L (ref 136–145)
Total Bilirubin: 0.37 mg/dL (ref 0.20–1.20)
Total Protein: 6.5 g/dL (ref 6.4–8.3)

## 2015-08-07 MED ORDER — DIPHENHYDRAMINE HCL 25 MG PO CAPS: 50.0000 mg | ORAL_CAPSULE | Freq: Once | ORAL | Status: DC

## 2015-08-07 MED ORDER — HEPARIN SOD (PORK) LOCK FLUSH 100 UNIT/ML IV SOLN
500.0000 [IU] | Freq: Once | INTRAVENOUS | Status: AC | PRN
  Administered 2015-08-07: 500 [IU]
  Filled 2015-08-07: qty 5

## 2015-08-07 MED ORDER — SODIUM CHLORIDE 0.9 % IV SOLN
Freq: Once | INTRAVENOUS | Status: AC
  Administered 2015-08-07: 09:00:00 via INTRAVENOUS

## 2015-08-07 MED ORDER — SODIUM CHLORIDE 0.9 % IV SOLN
6.0000 mg/kg | Freq: Once | INTRAVENOUS | Status: AC
  Administered 2015-08-07: 399 mg via INTRAVENOUS
  Filled 2015-08-07: qty 19

## 2015-08-07 MED ORDER — ACETAMINOPHEN 325 MG PO TABS
650.0000 mg | ORAL_TABLET | Freq: Once | ORAL | Status: AC
  Administered 2015-08-07: 650 mg via ORAL

## 2015-08-07 MED ORDER — SODIUM CHLORIDE 0.9 % IJ SOLN
10.0000 mL | INTRAMUSCULAR | Status: DC | PRN
  Administered 2015-08-07: 10 mL
  Filled 2015-08-07: qty 10

## 2015-08-07 NOTE — Patient Instructions (Signed)
[facility] Discharge Instructions for Patients Receiving Chemotherapy  Today you received the following chemotherapy agents Herceptin  To help prevent nausea and vomiting after your treatment, we encourage you to take your nausea medication    If you develop nausea and vomiting that is not controlled by your nausea medication, call the clinic.   BELOW ARE SYMPTOMS THAT SHOULD BE REPORTED IMMEDIATELY:  *FEVER GREATER THAN 100.5 F  *CHILLS WITH OR WITHOUT FEVER  NAUSEA AND VOMITING THAT IS NOT CONTROLLED WITH YOUR NAUSEA MEDICATION  *UNUSUAL SHORTNESS OF BREATH  *UNUSUAL BRUISING OR BLEEDING  TENDERNESS IN MOUTH AND THROAT WITH OR WITHOUT PRESENCE OF ULCERS  *URINARY PROBLEMS  *BOWEL PROBLEMS  UNUSUAL RASH Items with * indicate a potential emergency and should be followed up as soon as possible.  Feel free to call the clinic you have any questions or concerns. The clinic phone number is [phone].  Please show the CHEMO ALERT CARD at check-in to the Emergency Department and triage nurse.   

## 2015-08-27 ENCOUNTER — Ambulatory Visit: Payer: Managed Care, Other (non HMO) | Admitting: Hematology and Oncology

## 2015-08-27 ENCOUNTER — Other Ambulatory Visit: Payer: Self-pay

## 2015-08-27 ENCOUNTER — Other Ambulatory Visit: Payer: Managed Care, Other (non HMO)

## 2015-08-27 DIAGNOSIS — C50412 Malignant neoplasm of upper-outer quadrant of left female breast: Secondary | ICD-10-CM

## 2015-08-27 NOTE — Assessment & Plan Note (Signed)
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  Pathology: Lumpectomy at [facility] on 01/29/2015: residual microscopic invasive ductal carcinoma spanning 3.2 cm T2 N0 M0 Pathological stage II a  Treatment summary: Neoadjuvant chemotherapy with Taxotere, carboplatin, Herceptin and Perjeta given once every 3 weeks from 09/19/2014 to 01/02/2015 .Adjuvant radiation therapy completed June 2016; started tamoxifen 06/05/2015 Cardiac monitoring: echocardiogram October 2015 EF 55%; 01/06/2015 EF 60-65%; 05/20/2015 EF 60-65% Current treatment:  1. Herceptin every 3 weeks; today's last Herceptin 2. Tamoxifen 20 mg daily started 06/05/2015  Tamoxifen toxicities: Denies any hot flashes or myalgias or any side effects from tamoxifen she does feel mildly fatigued  Return to clinic in 6 months for follow-up

## 2015-08-28 ENCOUNTER — Encounter: Payer: Self-pay | Admitting: *Deleted

## 2015-08-28 ENCOUNTER — Ambulatory Visit (HOSPITAL_BASED_OUTPATIENT_CLINIC_OR_DEPARTMENT_OTHER): Payer: Managed Care, Other (non HMO)

## 2015-08-28 ENCOUNTER — Telehealth: Payer: Self-pay | Admitting: Hematology and Oncology

## 2015-08-28 ENCOUNTER — Other Ambulatory Visit: Payer: Self-pay | Admitting: *Deleted

## 2015-08-28 ENCOUNTER — Other Ambulatory Visit (HOSPITAL_BASED_OUTPATIENT_CLINIC_OR_DEPARTMENT_OTHER): Payer: Managed Care, Other (non HMO)

## 2015-08-28 ENCOUNTER — Encounter: Payer: Self-pay | Admitting: Hematology and Oncology

## 2015-08-28 ENCOUNTER — Ambulatory Visit (HOSPITAL_BASED_OUTPATIENT_CLINIC_OR_DEPARTMENT_OTHER): Payer: Managed Care, Other (non HMO) | Admitting: Hematology and Oncology

## 2015-08-28 VITALS — BP 120/74 | HR 75 | Temp 98.2°F | Resp 18 | Ht 67.0 in | Wt 146.4 lb

## 2015-08-28 DIAGNOSIS — C50412 Malignant neoplasm of upper-outer quadrant of left female breast: Secondary | ICD-10-CM

## 2015-08-28 DIAGNOSIS — R5383 Other fatigue: Secondary | ICD-10-CM

## 2015-08-28 DIAGNOSIS — Z5112 Encounter for antineoplastic immunotherapy: Secondary | ICD-10-CM | POA: Diagnosis not present

## 2015-08-28 LAB — CBC WITH DIFFERENTIAL/PLATELET
BASO%: 0.6 % (ref 0.0–2.0)
Basophils Absolute: 0 10*3/uL (ref 0.0–0.1)
EOS%: 3 % (ref 0.0–7.0)
Eosinophils Absolute: 0.1 10*3/uL (ref 0.0–0.5)
HCT: 35.7 % (ref 34.8–46.6)
HGB: 12 g/dL (ref 11.6–15.9)
LYMPH%: 23.2 % (ref 14.0–49.7)
MCH: 31 pg (ref 25.1–34.0)
MCHC: 33.7 g/dL (ref 31.5–36.0)
MCV: 91.7 fL (ref 79.5–101.0)
MONO#: 0.3 10*3/uL (ref 0.1–0.9)
MONO%: 6.9 % (ref 0.0–14.0)
NEUT#: 2.6 10*3/uL (ref 1.5–6.5)
NEUT%: 66.3 % (ref 38.4–76.8)
Platelets: 206 10*3/uL (ref 145–400)
RBC: 3.89 10*6/uL (ref 3.70–5.45)
RDW: 12.3 % (ref 11.2–14.5)
WBC: 3.9 10*3/uL (ref 3.9–10.3)
lymph#: 0.9 10*3/uL (ref 0.9–3.3)

## 2015-08-28 LAB — COMPREHENSIVE METABOLIC PANEL (CC13)
ALT: <9 U/L (ref 0–55)
AST: 12 U/L (ref 5–34)
Albumin: 3.8 g/dL (ref 3.5–5.0)
Alkaline Phosphatase: 43 U/L (ref 40–150)
Anion Gap: 6 mEq/L (ref 3–11)
BUN: 10 mg/dL (ref 7.0–26.0)
CO2: 23 mEq/L (ref 22–29)
Calcium: 9.3 mg/dL (ref 8.4–10.4)
Chloride: 111 mEq/L — ABNORMAL HIGH (ref 98–109)
Creatinine: 0.8 mg/dL (ref 0.6–1.1)
EGFR: 87 mL/min/{1.73_m2} — ABNORMAL LOW (ref >90)
Glucose: 94 mg/dl (ref 70–140)
Potassium: 4.1 mEq/L (ref 3.5–5.1)
Sodium: 140 mEq/L (ref 136–145)
Total Bilirubin: <0.3 mg/dL (ref 0.20–1.20)
Total Protein: 6.7 g/dL (ref 6.4–8.3)

## 2015-08-28 MED ORDER — DIPHENHYDRAMINE HCL 25 MG PO CAPS
50.0000 mg | ORAL_CAPSULE | Freq: Once | ORAL | Status: DC
Start: 2015-08-28 — End: 2015-08-28

## 2015-08-28 MED ORDER — SODIUM CHLORIDE 0.9 % IV SOLN
Freq: Once | INTRAVENOUS | Status: AC
  Administered 2015-08-28: 11:00:00 via INTRAVENOUS

## 2015-08-28 MED ORDER — SODIUM CHLORIDE 0.9 % IJ SOLN
10.0000 mL | INTRAMUSCULAR | Status: DC | PRN
  Administered 2015-08-28: 10 mL
  Filled 2015-08-28: qty 10

## 2015-08-28 MED ORDER — TRASTUZUMAB CHEMO INJECTION 440 MG
6.0000 mg/kg | Freq: Once | INTRAVENOUS | Status: AC
  Administered 2015-08-28: 399 mg via INTRAVENOUS
  Filled 2015-08-28: qty 19

## 2015-08-28 MED ORDER — HEPARIN SOD (PORK) LOCK FLUSH 100 UNIT/ML IV SOLN
500.0000 [IU] | Freq: Once | INTRAVENOUS | Status: AC | PRN
  Administered 2015-08-28: 500 [IU]
  Filled 2015-08-28: qty 5

## 2015-08-28 MED ORDER — ACETAMINOPHEN 325 MG PO TABS
650.0000 mg | ORAL_TABLET | Freq: Once | ORAL | Status: AC
  Administered 2015-08-28: 650 mg via ORAL

## 2015-08-28 NOTE — Patient Instructions (Signed)
[facility] Discharge Instructions for Patients Receiving Chemotherapy  Today you received the following chemotherapy agents: Herceptin   To help prevent nausea and vomiting after your treatment, we encourage you to take your nausea medication as directed.    If you develop nausea and vomiting that is not controlled by your nausea medication, call the clinic.   BELOW ARE SYMPTOMS THAT SHOULD BE REPORTED IMMEDIATELY:  *FEVER GREATER THAN 100.5 F  *CHILLS WITH OR WITHOUT FEVER  NAUSEA AND VOMITING THAT IS NOT CONTROLLED WITH YOUR NAUSEA MEDICATION  *UNUSUAL SHORTNESS OF BREATH  *UNUSUAL BRUISING OR BLEEDING  TENDERNESS IN MOUTH AND THROAT WITH OR WITHOUT PRESENCE OF ULCERS  *URINARY PROBLEMS  *BOWEL PROBLEMS  UNUSUAL RASH Items with * indicate a potential emergency and should be followed up as soon as possible.  Feel free to call the clinic you have any questions or concerns. The clinic phone number is [phone].  Please show the CHEMO ALERT CARD at check-in to the Emergency Department and triage nurse.   

## 2015-08-28 NOTE — Addendum Note (Signed)
Addended by: Prentiss Bells on: 08/28/2015 01:04 PM   Modules accepted: Medications

## 2015-08-28 NOTE — Progress Notes (Signed)
Patient Care Team: Thurnell Lose, MD as PCP - General (Obstetrics and Gynecology) Thurnell Lose, MD (Obstetrics and Gynecology) Erroll Luna, MD as Consulting Physician (General Surgery) Nicholas Lose, MD as Consulting Physician (Hematology and Oncology) Thea Silversmith, MD as Consulting Physician (Radiation Oncology) Erroll Luna, MD as Consulting Physician (General Surgery) Nicholas Lose, MD as Consulting Physician (Hematology and Oncology) Thea Silversmith, MD as Consulting Physician (Radiation Oncology)  DIAGNOSIS: Breast cancer of upper-outer quadrant of left female breast [facility])   Staging form: Breast, AJCC 7th Edition     Clinical: Stage IIA (T2, N0, cM0) - Unsigned       Staging comments: Staged at breast conference on 10.21.15      Pathologic: No stage assigned - Unsigned   SUMMARY OF ONCOLOGIC HISTORY:   Breast cancer of upper-outer quadrant of left female breast ([place])   08/20/2014 Initial Diagnosis Invasive ductal carcinoma grade 3, ER PR positive HER-2 positive ratio 3.5, Ki-67 25%; biopsy of satellite lesion fibroadenoma   08/26/2014 Breast MRI Left breast: Upper-outer quadrant 1.4 cm from nipple 1.9 x 2.5 x 2.6 cm enhancing mass with the 8 mm nodule 1 cm posterior Right breast: 1.4 x 1.6 cm mass upper outer quadrant biopsy proven fibroadenoma:   09/19/2014 -  Neo-Adjuvant Chemotherapy Neoadjuvant chemotherapy with Taxotere, carboplatin, Herceptin and Perjeta x6 cycles followed by Herceptin maintenance to be complete November 2016   01/09/2015 Breast MRI Interval marked response to chemotherapy with significant decrease in the size of the tumor, satellite nodule is now vaguely visible, 2.6 cm mass is now 0.9 cm   01/29/2015 Surgery Left breast lumpect[facility] by Dr. Ernst Bowler [facility]; 3.2 cm area of microscopic cells, 2 sentinel lymph nodes negative T2 N0 M0 stage II a   03/10/2015 - 04/27/2015 Radiation Therapy Adjuvant radiation   06/05/2015 -  Anti-estrogen oral therapy  Tamoxifen 20 mg daily    CHIEF COMPLIANT: Follow-up on Herceptin and tamoxifen  INTERVAL HISTORY: Kim Armstrong is a 39 year old with above-mentioned history of left breast cancer treated with lumpectomy and radiation after neoadjuvant chemotherapy. She is currently on tamoxifen. Today's last dose of Herceptin. She appears to be tolerating tamoxifen extremely well. Denies any hot flashes or myalgias.  REVIEW OF SYSTEMS:   Constitutional: Denies fevers, chills or abnormal weight loss Eyes: Denies blurriness of vision Ears, nose, mouth, throat, and face: Denies mucositis or sore throat Respiratory: Denies cough, dyspnea or wheezes Cardiovascular: Denies palpitation, chest discomfort or lower extremity swelling Gastrointestinal:  Denies nausea, heartburn or change in bowel habits Skin: Denies abnormal skin rashes Lymphatics: Denies new lymphadenopathy or easy bruising Neurological:Denies numbness, tingling or new weaknesses Behavioral/Psych: Mood is stable, no new changes  Breast:  denies any pain or lumps or nodules in either breasts All other systems were reviewed with the patient and are negative.  I have reviewed the past medical history, past surgical history, social history and family history with the patient and they are unchanged from previous note.  ALLERGIES:  has No Known Allergies.  MEDICATIONS:  Current Outpatient Prescriptions  Medication Sig Dispense Refill  . acetaminophen (TYLENOL) 500 MG tablet Take 1,000 mg by mouth every 6 (six) hours as needed for mild pain or moderate pain.    . diphenhydramine-acetaminophen (TYLENOL PM) 25-500 MG TABS Take 1 tablet by mouth at bedtime as needed.    . tamoxifen (NOLVADEX) 20 MG tablet Take 1 tablet (20 mg total) by mouth daily. 90 tablet 3   No current facility-administered medications for this visit.  Facility-Administered Medications Ordered in Other Visits  Medication Dose Route Frequency Provider Last Rate Last Dose  .  diphenhydrAMINE (BENADRYL) capsule 50 mg  50 mg Oral Once Nicholas Lose, MD   50 mg at 04/17/15 1527  . sodium chloride 0.9 % injection 10 mL  10 mL Intracatheter PRN Nicholas Lose, MD   10 mL at 04/17/15 1527    PHYSICAL EXAMINATION: ECOG PERFORMANCE STATUS: 0 - Asymptomatic  Filed Vitals:   08/28/15 0920  BP: 120/74  Pulse: 75  Temp: 98.2 F (36.8 C)  Resp: 18   Filed Weights   08/28/15 0920  Weight: 146 lb 6.4 oz (66.407 kg)    GENERAL:alert, no distress and comfortable SKIN: skin color, texture, turgor are normal, no rashes or significant lesions EYES: normal, Conjunctiva are pink and non-injected, sclera clear OROPHARYNX:no exudate, no erythema and lips, buccal mucosa, and tongue normal  NECK: supple, thyroid normal size, non-tender, without nodularity LYMPH:  no palpable lymphadenopathy in the cervical, axillary or inguinal LUNGS: clear to auscultation and percussion with normal breathing effort HEART: regular rate & rhythm and no murmurs and no lower extremity edema ABDOMEN:abdomen soft, non-tender and normal bowel sounds Musculoskeletal:no cyanosis of digits and no clubbing  NEURO: alert & oriented x 3 with fluent speech, no focal motor/sensory deficits BREAST: No palpable masses or nodules in either right or left breasts. No palpable axillary supraclavicular or infraclavicular adenopathy no breast tenderness or nipple discharge. (exam performed in the presence of a chaperone)  LABORATORY DATA:  I have reviewed the data as listed   Chemistry      Component Value Date/Time   NA 141 08/07/2015 0819   K 4.2 08/07/2015 0819   CO2 24 08/07/2015 0819   BUN 11.1 08/07/2015 0819   CREATININE 0.9 08/07/2015 0819      Component Value Date/Time   CALCIUM 9.1 08/07/2015 0819   ALKPHOS 41 08/07/2015 0819   AST 12 08/07/2015 0819   ALT <9 08/07/2015 0819   BILITOT 0.37 08/07/2015 0819       Lab Results  Component Value Date   WBC 3.9 08/28/2015   HGB 12.0 08/28/2015    HCT 35.7 08/28/2015   MCV 91.7 08/28/2015   PLT 206 08/28/2015   NEUTROABS 2.6 08/28/2015     Secretions sASSESSMENT & PLAN:  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  Pathology: [facility]omy at [facility] on 01/29/2015: residual microscopic invasive ductal carcinoma spanning 3.2 cm T2 N0 M0 Pathological stage II a  Treatment summary: Neoadjuvant chemotherapy with Taxotere, carboplatin, Herceptin and Perjeta given once every 3 weeks from 09/19/2014 to 01/02/2015 .Adjuvant radiation therapy completed June 2016; started tamoxifen 06/05/2015 Cardiac monitoring: echocardiogram October 2015 EF 55%; 01/06/2015 EF 60-65%; 05/20/2015 EF 60-65% Current treatment:  1. Herceptin every 3 weeks; today's last Herceptin 2. Tamoxifen 20 mg daily started 06/05/2015  Breast Cancer Surveillance: 1. Breast exam 08/28/2015: Normal 2. Mammogram will be ordered for next week at [facility].   Tamoxifen toxicities: Denies any hot flashes or myalgias or any side effects from tamoxifen she does feel mildly fatigued  Return to clinic in 6 months for follow-up    Orders Placed This Encounter  Procedures  . MM Digital Diagnostic Bilat    Standing Status: Future     Number of Occurrences:  Standing Expiration Date: 08/27/2016    Order Specific Question:  Reason for Exam (SYMPTOM  OR DIAGNOSIS REQUIRED)    Answer:  H/O breast cancer annual evaluation    Order Specific Question:  Is the patient pregnant?    Answer:  No    Order Specific Question:  Preferred imaging location?    Answer:  External     Comments:  [facility]   The patient has a good understanding of the overall plan. she agrees with it. she will call with any problems that may develop before the next visit here.   Rulon Eisenmenger,  MD 08/28/2015

## 2015-08-28 NOTE — Telephone Encounter (Signed)
Appointments made and avs printed and mailed to patient along with survivorship appointment and pamphlet

## 2015-08-28 NOTE — Progress Notes (Signed)
Met with pt for her final herceptin. Denies needs or questions at this time. Discussed survivorship program and referral. Encourage pt to call with concerns. Received verbal understanding.

## 2015-08-31 ENCOUNTER — Other Ambulatory Visit: Payer: Self-pay | Admitting: *Deleted

## 2015-09-09 ENCOUNTER — Other Ambulatory Visit: Payer: Self-pay | Admitting: Radiology

## 2015-09-10 ENCOUNTER — Encounter (HOSPITAL_COMMUNITY): Payer: Self-pay

## 2015-09-10 ENCOUNTER — Ambulatory Visit (HOSPITAL_COMMUNITY)
Admission: RE | Admit: 2015-09-10 | Discharge: 2015-09-10 | Disposition: A | Discharge status: Home / Self Care | Payer: Managed Care, Other (non HMO) | Source: Ambulatory Visit | Attending: Hematology and Oncology | Admitting: Hematology and Oncology

## 2015-09-10 DIAGNOSIS — Z452 Encounter for adjustment and management of vascular access device: Secondary | ICD-10-CM | POA: Diagnosis not present

## 2015-09-10 DIAGNOSIS — C50412 Malignant neoplasm of upper-outer quadrant of left female breast: Secondary | ICD-10-CM | POA: Insufficient documentation

## 2015-09-10 DIAGNOSIS — Z853 Personal history of malignant neoplasm of breast: Secondary | ICD-10-CM | POA: Diagnosis not present

## 2015-09-10 DIAGNOSIS — Z923 Personal history of irradiation: Secondary | ICD-10-CM | POA: Diagnosis not present

## 2015-09-10 DIAGNOSIS — Z9221 Personal history of antineoplastic chemotherapy: Secondary | ICD-10-CM | POA: Diagnosis not present

## 2015-09-10 LAB — CBC WITH DIFFERENTIAL/PLATELET
Basophils Absolute: 0 10*3/uL (ref 0.0–0.1)
Basophils Relative: 0 %
Eosinophils Absolute: 0.1 10*3/uL (ref 0.0–0.7)
Eosinophils Relative: 4 %
HCT: 35.6 % — ABNORMAL LOW (ref 36.0–46.0)
Hemoglobin: 12.1 g/dL (ref 12.0–15.0)
Lymphocytes Relative: 33 %
Lymphs Abs: 0.8 10*3/uL (ref 0.7–4.0)
MCH: 30.7 pg (ref 26.0–34.0)
MCHC: 34 g/dL (ref 30.0–36.0)
MCV: 90.4 fL (ref 78.0–100.0)
Monocytes Absolute: 0.2 10*3/uL (ref 0.1–1.0)
Monocytes Relative: 8 %
Neutro Abs: 1.3 10*3/uL — ABNORMAL LOW (ref 1.7–7.7)
Neutrophils Relative %: 55 %
Platelets: 175 10*3/uL (ref 150–400)
RBC: 3.94 MIL/uL (ref 3.87–5.11)
RDW: 12.1 % (ref 11.5–15.5)
WBC: 2.4 10*3/uL — ABNORMAL LOW (ref 4.0–10.5)

## 2015-09-10 LAB — PROTIME-INR
INR: 0.97 (ref 0.00–1.49)
Prothrombin Time: 13.1 seconds (ref 11.6–15.2)

## 2015-09-10 MED ORDER — FENTANYL CITRATE (PF) 100 MCG/2ML IJ SOLN
INTRAMUSCULAR | Status: AC
  Filled 2015-09-10: qty 4

## 2015-09-10 MED ORDER — MIDAZOLAM HCL 2 MG/2ML IJ SOLN
INTRAMUSCULAR | Status: AC
  Filled 2015-09-10: qty 6

## 2015-09-10 MED ORDER — FENTANYL CITRATE (PF) 100 MCG/2ML IJ SOLN
INTRAMUSCULAR | Status: AC | PRN
  Administered 2015-09-10: 50 ug via INTRAVENOUS

## 2015-09-10 MED ORDER — CEFAZOLIN SODIUM-DEXTROSE 2-3 GM-% IV SOLR
2.0000 g | Freq: Once | INTRAVENOUS | Status: AC
  Administered 2015-09-10: 2 g via INTRAVENOUS

## 2015-09-10 MED ORDER — MIDAZOLAM HCL 2 MG/2ML IJ SOLN
INTRAMUSCULAR | Status: AC | PRN
  Administered 2015-09-10: 0.5 mg via INTRAVENOUS
  Administered 2015-09-10 (×3): 1 mg via INTRAVENOUS

## 2015-09-10 MED ORDER — LIDOCAINE-EPINEPHRINE 2 %-1:100000 IJ SOLN
INTRAMUSCULAR | Status: AC
  Filled 2015-09-10: qty 1

## 2015-09-10 MED ORDER — LIDOCAINE HCL 1 % IJ SOLN
INTRAMUSCULAR | Status: DC
Start: 2015-09-10 — End: 2015-09-10
  Filled 2015-09-10: qty 20

## 2015-09-10 MED ORDER — CEFAZOLIN SODIUM-DEXTROSE 2-3 GM-% IV SOLR
INTRAVENOUS | Status: AC
  Administered 2015-09-10: 2 g via INTRAVENOUS
  Filled 2015-09-10: qty 50

## 2015-09-10 MED ORDER — SODIUM CHLORIDE 0.9 % IV SOLN
INTRAVENOUS | Status: DC
  Administered 2015-09-10: 08:00:00 via INTRAVENOUS

## 2015-09-10 MED ORDER — HYDROCODONE-ACETAMINOPHEN 5-325 MG PO TABS: 1.0000 | ORAL_TABLET | ORAL | Status: DC | PRN

## 2015-09-10 NOTE — Procedures (Signed)
R IJ port removal No complication No blood loss. See complete dictation in [facility] PACS.  

## 2015-09-10 NOTE — Progress Notes (Signed)
Patient ID: Kim Armstrong, female   DOB: 04-Jun-1976, 39 y.o.   MRN: [id number]    Referring Physician(s): Gudena,Vinay  Chief Complaint:  Breast cancer  Subjective: Pt familiar to IR service from prior right chest wall PAC placement on 09/12/2014. She has history of left breast cancer treated with lumpectomy and radiation after neoadjuvant chemotherapy as well as tamoxifen and Herceptin. She has completed chemotherapy and presents today for port a cath removal. She denies recent fevers, chills, CP, dyspnea, cough, abd/back pain, N/V or bleeding.   Allergies: Review of patient's allergies indicates no known allergies.  Medications: Prior to Admission medications   Medication Sig Start Date End Date Taking? Authorizing Provider  acetaminophen (TYLENOL) 500 MG tablet Take 1,000 mg by mouth every 6 (six) hours as needed for mild pain or moderate pain.   Yes Historical Provider, MD  diphenhydramine-acetaminophen (TYLENOL PM) 25-500 MG TABS Take 1 tablet by mouth at bedtime as needed.   Yes Historical Provider, MD  tamoxifen (NOLVADEX) 20 MG tablet Take 1 tablet (20 mg total) by mouth daily. 06/05/15  Yes Nicholas Lose, MD     Vital Signs: BP 115/73 mmHg  Pulse 65  Temp(Src) 98.1 F (36.7 C) (Oral)  Resp 18  Ht 5\' 7"  (1.702 m)  Wt 146 lb (66.225 kg)  BMI 22.86 kg/m2  SpO2 98%  LMP 08/31/2015  Physical Exam  Constitutional: She is oriented to person, place, and time. She appears well-developed and well-nourished.  Cardiovascular: Normal rate and regular rhythm.   Clean, intact ,NT rt chest wall PAC  Pulmonary/Chest: Effort normal and breath sounds normal.  Abdominal: Soft. Bowel sounds are normal.  Musculoskeletal: Normal range of motion. She exhibits no edema.  Neurological: She is alert and oriented to person, place, and time.    Imaging: No results found.  Labs:  CBC:  Recent Labs  06/05/15 0826 08/07/15 0819 08/28/15 0907 09/10/15 0815  WBC 3.1* 3.1* 3.9 2.4*    HGB 12.4 11.9 12.0 12.1  HCT 36.2 35.2 35.7 35.6*  PLT 181 189 206 175    COAGS:  Recent Labs  09/12/14 1055 09/10/15 0815  INR 0.97 0.97  APTT 29  --     BMP:  Recent Labs  03/27/15 0904 06/05/15 0826 08/07/15 0819 08/28/15 0907  NA 141 140 141 140  K 4.1 4.2 4.2 4.1  CO2 24 24 24 23   GLUCOSE 92 98 94 94  BUN 13.2 12.3 11.1 10.0  CALCIUM 9.1 8.9 9.1 9.3  CREATININE 0.8 0.8 0.9 0.8    LIVER FUNCTION TESTS:  Recent Labs  03/27/15 0904 06/05/15 0826 08/07/15 0819 08/28/15 0907  BILITOT 0.30 <0.20 0.37 <0.30  AST 15 15 12 12   ALT 10 11 <9 <9  ALKPHOS 58 54 41 43  PROT 6.5 6.6 6.5 6.7  ALBUMIN 3.8 3.8 3.8 3.8    Assessment and Plan: Pt with hx left breast cancer and previously placed rt IJ PAC in 09/2014. She has completed chemotherapy and presents today for port removal. Details/risks of procedure , including but not limited to , internal bleeding, infection d/w pt with her understanding and consent. WBC today is 2.4.   Signed: D. Rowe Robert 09/10/2015, 8:42 AM   I spent a total of 15 minutes at the the patient's bedside AND on the patient's hospital floor or unit, greater than 50% of which was counseling/coordinating care for port a cath removal

## 2015-09-10 NOTE — Discharge Instructions (Signed)
Incision Care °An incision is when a surgeon cuts into your body. After surgery, the incision needs to be cared for properly to prevent infection.  °HOW TO CARE FOR YOUR INCISION °· Take medicines only as directed by your health care provider. °· There are many different ways to close and cover an incision, including stitches, skin glue, and adhesive strips. Follow your health care provider's instructions on: °¨ Incision care. °¨ Bandage (dressing) changes and removal. °¨ Incision closure removal. °· Do not take baths, swim, or use a hot tub until your health care provider approves. You may shower as directed by your health care provider. °· Resume your normal diet and activities as directed. °· Use anti-itch medicine (such as an antihistamine) as directed by your health care provider. The incision may itch while it is healing. Do not pick or scratch at the incision. °· Drink enough fluid to keep your urine clear or pale yellow. °SEEK MEDICAL CARE IF:  °· You have drainage, redness, swelling, or pain at your incision site. °· You have muscle aches, chills, or a general ill feeling. °· You notice a bad smell coming from the incision or dressing. °· Your incision edges separate after the sutures, staples, or skin adhesive strips have been removed. °· You have persistent nausea or vomiting. °· You have a fever. °· You are dizzy. °SEEK IMMEDIATE MEDICAL CARE IF:  °· You have a rash. °· You faint. °· You have difficulty breathing. °MAKE SURE YOU:  °· Understand these instructions. °· Will watch your condition. °· Will get help right away if you are not doing well or get worse. °  °This information is not intended to replace advice given to you by your health care provider. Make sure you discuss any questions you have with your health care provider. °  °Document Released: 05/13/2005 Document Revised: 11/14/2014 Document Reviewed: 12/18/2013 °Elsevier Interactive Patient Education ©2016 [employer] ° ° °Moderate  Conscious Sedation, Adult, Care After °Refer to this sheet in the next few weeks. These instructions provide you with information on caring for yourself after your procedure. Your health care provider may also give you more specific instructions. Your treatment has been planned according to current medical practices, but problems sometimes occur. Call your health care provider if you have any problems or questions after your procedure. °WHAT TO EXPECT AFTER THE PROCEDURE  °After your procedure: °· You may feel sleepy, clumsy, and have poor balance for several hours. °· Vomiting may occur if you eat too soon after the procedure. °HOME CARE INSTRUCTIONS °· Do not participate in any activities where you could become injured for at least 24 hours. Do not: °¨ Drive. °¨ Swim. °¨ Ride a bicycle. °¨ Operate heavy machinery. °¨ Cook. °¨ Use power tools. °¨ Climb ladders. °¨ Work from a high place. °· Do not make important decisions or sign legal documents until you are improved. °· If you vomit, drink water, juice, or soup when you can drink without vomiting. Make sure you have little or no nausea before eating solid foods. °· Only take over-the-counter or prescription medicines for pain, discomfort, or fever as directed by your health care provider. °· Make sure you and your family fully understand everything about the medicines given to you, including what side effects may occur. °· You should not drink alcohol, take sleeping pills, or take medicines that cause drowsiness for at least 24 hours. °· If you smoke, do not smoke without supervision. °· If you are feeling   better, you may resume normal activities 24 hours after you were sedated. °· Keep all appointments with your health care provider. °SEEK MEDICAL CARE IF: °· Your skin is pale or bluish in color. °· You continue to feel nauseous or vomit. °· Your pain is getting worse and is not helped by medicine. °· You have bleeding or swelling. °· You are still sleepy or  feeling clumsy after 24 hours. °SEEK IMMEDIATE MEDICAL CARE IF: °· You develop a rash. °· You have difficulty breathing. °· You develop any type of allergic problem. °· You have a fever. °MAKE SURE YOU: °· Understand these instructions. °· Will watch your condition. °· Will get help right away if you are not doing well or get worse. °  °This information is not intended to replace advice given to you by your health care provider. Make sure you discuss any questions you have with your health care provider. °  °Document Released: 08/14/2013 Document Revised: 11/14/2014 Document Reviewed: 08/14/2013 °Elsevier Interactive Patient Education ©2016 [employer] ° °

## 2015-11-03 ENCOUNTER — Ambulatory Visit (HOSPITAL_BASED_OUTPATIENT_CLINIC_OR_DEPARTMENT_OTHER): Payer: Managed Care, Other (non HMO) | Admitting: Nurse Practitioner

## 2015-11-03 ENCOUNTER — Encounter: Payer: Self-pay | Admitting: Nurse Practitioner

## 2015-11-03 VITALS — BP 103/74 | HR 66 | Temp 98.6°F | Resp 18 | Ht 67.0 in | Wt 146.3 lb

## 2015-11-03 DIAGNOSIS — Z170 Estrogen receptor positive status [ER+]: Secondary | ICD-10-CM | POA: Diagnosis not present

## 2015-11-03 DIAGNOSIS — Z79810 Long term (current) use of selective estrogen receptor modulators (SERMs): Secondary | ICD-10-CM

## 2015-11-03 DIAGNOSIS — C50412 Malignant neoplasm of upper-outer quadrant of left female breast: Secondary | ICD-10-CM | POA: Diagnosis not present

## 2015-11-03 NOTE — Progress Notes (Signed)
CLINIC:  Cancer Survivorship   REASON FOR VISIT:  Routine follow-up post-treatment for a recent history of breast cancer.  BRIEF ONCOLOGIC HISTORY:    Breast cancer of upper-outer quadrant of left female breast ([place])   08/20/2014 Initial Biopsy Left breast bx: Invasive ductal carcinoma grade 3, ER+ (100%), PR+ (89%) HER-2 positive ratio 3.5, Ki-67 25%; biopsy of satellite lesion fibroadenoma   08/26/2014 Breast MRI Left breast: Upper-outer quadrant 1.4 cm from nipple 1.9 x 2.5 x 2.6 cm enhancing mass with the 8 mm nodule 1 cm posterior Right breast: 1.4 x 1.6 cm mass upper outer quadrant biopsy proven fibroadenoma   08/26/2014 Clinical Stage Stage IIA: T2 N0   09/19/2014 -  Neo-Adjuvant Chemotherapy Neoadjuvant chemotherapy with Taxotere, carboplatin, Herceptin and Perjeta x6 cycles followed by Herceptin maintenance to be complete November 2016   09/26/2014 Procedure 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 Otherwise negative at ATM, BARD1, BRCA1, BRCA2, BRIP1, CDH1, CHEK2, EPCAM, MLH1, MRE11A, MSH2, MSH6, MUTYH, NBN, NF1, PALB2, PMS2, PTEN, RAD50, RAD51C, RAD51D, SMARCA4, STK11, and TP53.   01/09/2015 Breast MRI Interval marked response to chemotherapy with significant decrease in the size of the tumor, satellite nodule is now vaguely visible, 2.6 cm mass is now 0.9 cm   01/29/2015 Definitive Surgery Left breast lumpectomy/SLNB by Dr. Ernst Bowler at [facility]; 3.2 cm area of microscopic IDC cells, 2 sentinel lymph nodes negative (0/2). 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  Left breast boost/ 16 Gy at 2 Gy per fraction x 8 fractions   06/05/2015 -  Anti-estrogen oral therapy Tamoxifen 20 mg daily. Planned duration of treatment 5-10 years.    INTERVAL HISTORY:  Kim Armstrong presents to the [facility] today for  our initial meeting to review her survivorship care plan detailing her treatment course for breast cancer, as well as monitoring long-term side effects of that treatment, education regarding health maintenance, screening, and overall wellness and health promotion.     Overall, Kim Armstrong reports feeling pretty well since completing her trastuzumab therapy approximately two months ago. She remains fatigued throughout the week but states that it is getting better and does not interfere with her ability to perform her daily tasks.  She has not yet returned to work but plans to go back sometime in 2017. She is still grieving the loss of her husband this past spring. She is seeing a Social worker and reports anxiety, but denies depression symptoms.  She is tolerating her tamoxifen well without side effects.  She denies any headache, cough, shortness of breath or bone pain.  She has a good appetite and denies / reports weight loss.  Kim Armstrong has noted no change within her breast and underwent bilateral mammography 09/16/2015 which was unremarkable. She reports that she received a letter from [facility] regarding the need to set up a follow up appointment with Dr. Ernst Bowler. She is scheduled to see Dr. Lindi Adie in April 2017.  REVIEW OF SYSTEMS:  General: Fatigue as above. Denies fever, chills, unintentional weight loss, or night sweats.  HEENT: Wears glasses. Denies visual changes, hearing loss, mouth sores or difficulty swallowing. Cardiac: Denies palpitations, chest pain, and lower extremity edema.  Respiratory: Denies wheeze or dyspnea on exertion.  Breast: Denies any new nodularity, masses, tenderness, nipple changes, or nipple discharge.  GI: Denies abdominal  pain, constipation, diarrhea, nausea, or vomiting.  GU: Denies dysuria, hematuria, vaginal bleeding, vaginal discharge, or vaginal dryness.  Musculoskeletal: Denies joint or bone pain.  Neuro: Denies recent fall or numbness / tingling in her  extremities. Skin: Denies rash, pruritis, or open wounds.  Psych: Anxiety as above. Denies depression, insomnia, or memory loss.   A 14-point review of systems was completed and was negative, except as noted above.   ONCOLOGY TREATMENT TEAM:  1. Surgeon:  Dr. Ernst Bowler at [facility].  2. Medical Oncologist: Dr. Lindi Adie 3. Radiation Oncologist: Dr. Pablo Ledger    PAST MEDICAL/SURGICAL HISTORY:  Past Medical History  Diagnosis Date  . Breast cancer [facility])    Past Surgical History  Procedure Laterality Date  . Salpingectomy  2010  . Breast biopsy Left 10/14 and 21 of 2015     ALLERGIES:  No Known Allergies   CURRENT MEDICATIONS:  Current Outpatient Prescriptions on File Prior to Visit  Medication Sig Dispense Refill  . acetaminophen (TYLENOL) 500 MG tablet Take 1,000 mg by mouth every 6 (six) hours as needed for mild pain or moderate pain.    . diphenhydramine-acetaminophen (TYLENOL PM) 25-500 MG TABS Take 1 tablet by mouth at bedtime as needed.    . tamoxifen (NOLVADEX) 20 MG tablet Take 1 tablet (20 mg total) by mouth daily. 90 tablet 3   Current Facility-Administered Medications on File Prior to Visit  Medication Dose Route Frequency Provider Last Rate Last Dose  . diphenhydrAMINE (BENADRYL) capsule 50 mg  50 mg Oral Once Nicholas Lose, MD   50 mg at 04/17/15 1527  . sodium chloride 0.9 % injection 10 mL  10 mL Intracatheter PRN Nicholas Lose, MD   10 mL at 04/17/15 1527     ONCOLOGIC FAMILY HISTORY:  Family History  Problem Relation Age of Onset  . Breast cancer Mother 31    unilateral  . Cancer Maternal Aunt 65    unknown type of cancer  . Cancer Maternal Grandmother 39    cancer - possibly pancreatic cancer or GI cancer     GENETIC COUNSELING/TESTING: Yes, performed 09/26/14 revealing variant of unknown significance at RAD50, p.[id number]. Otherwise negative at ATM, BARD1, BRCA1, BRCA2, BRIP1, CDH1, CHEK2, EPCAM, MLH1, MRE11A, MSH2, MSH6, MUTYH, NBN, NF1, PALB2,  PMS2, PTEN, RAD50, RAD51C, RAD51D, SMARCA4, STK11, and TP53.   SOCIAL HISTORY:  Kim Armstrong is widowed and lives with her family in [place], [place].  She has 1 child. Kim Armstrong is currently not working but hopes to return to work later this year.  She is a former smoker, but denies any current / recent smoking history.  She drinks beer socially (few times /week at most) and denies any current or history of illicit drug use.     PHYSICAL EXAMINATION:  Vital Signs: Filed Vitals:   11/03/15 0917  BP: 103/74  Pulse: 66  Temp: 98.6 F (37 C)  Resp: 18   ECOG Performance Status: 0  General: Well-nourished, well-appearing female in no acute distress.  She is unaccompanied in clinic today.   HEENT: Head is atraumatic and normocephalic.  Pupils equal and reactive to light and accomodation. Conjunctivae clear without exudate.  Sclerae anicteric. Oral mucosa is pink, moist, and intact without lesions.  Oropharynx is pink without lesions or erythema.  Lymph: No cervical, supraclavicular, infraclavicular, or axillary lymphadenopathy noted on palpation.  Cardiovascular: Regular rate and rhythm without murmurs, rubs, or gallops. Respiratory: Clear to auscultation bilaterally. Chest expansion symmetric without accessory muscle use  on inspiration or expiration.  GI: Abdomen soft and round. No tenderness to palpation. Bowel sounds normoactive in 4 quadrants   GU: Deferred.  Musculoskeletal: Muscle strength 5/5 in all extremities.   Neuro: No focal deficits. Steady gait.  Psych: Mood and affect normal and appropriate for situation.  Extremities: No edema, cyanosis, or clubbing.  Skin: Warm and dry. No open lesions noted.   LABORATORY DATA:  None for this visit.  DIAGNOSTIC IMAGING:  Bilateral diagnostic mammogram performed 09/16/2015 reveals benign intermammary nodes in right breat with biopsy clip present in right breast.  Post operative changes in left breast.  No mass or  calcifications seen bilaterally.  Recommended follow up in one year.  Breast composition category C.    ASSESSMENT AND PLAN:   1. Breast cancer: Stage IIA invasive ductal carcinoma of the left breast, grade 3, ER positive, PR positive, HER2/neu positive S/P neoadjuvant chemotherapy with docetaxel, carboplatin, trastuzumab, and perjeta x 6 with interval marked response to chemotherapy, followed by left lumpectomy / SLNB ([facility] at [facility]) followed by maintenance trastruzumab to complete one year of therapy (10/16), adjuvant radiation therapy to left breast (6/16) followed by adjuvant endocrine therapy with tamoxifen initiated 7/16.  Kim Armstrong is doing well without clinical symptoms worrisome for disease recurrence. She will follow-up with her medical oncologist,  Dr. Lindi Adie, in April 2017 with history and physical examination per surveillance protocol.  She will continue her anti-estrogen therapy with tamoxifen as prescribed by Dr. Lindi Adie at this time. She was instructed to make Dr. Lindi Adie or myself aware if she begins to experience any new or increased side effects of the medication and I could see her back in clinic to help manage those side effects, as needed. Though the incidence is low, there is an associated risk of endometrial cancer with anti-estrogen therapies like Tamoxifen.  Kim Armstrong was encouraged to contact Dr. Lindi Adie or myself with any vaginal bleeding while taking Tamoxifen. Other side effects of Tamoxifen were again reviewed with her as well. A comprehensive survivorship care plan and treatment summary was reviewed with the patient today detailing her breast cancer diagnosis, treatment course, potential late/long-term effects of treatment, appropriate follow-up care with recommendations for the future, and patient education resources.  A copy of this summary, along with a letter will be sent to the patient's primary care provider via in basket message after today's visit.  Ms.  Armstrong is welcome to return to the Survivorship Clinic in the future, as needed; no follow-up will be scheduled at this time.    2. Cancer screening:  Due to Kim Armstrong's history and her age, she should receive screening for skin cancers, colon cancer (beginning at age 20), and gynecologic cancers.  The information and recommendations are listed on the patient's comprehensive care plan/treatment summary and were reviewed in detail with the patient.    3. Health maintenance and wellness promotion: Kim Armstrong was encouraged to consume 5-7 servings of fruits and vegetables per day. We reviewed the "Nutrition Rainbow" handout, as well as discussed recommendations to maximize nutrition and minimize recurrence, such as increased intake of fruits, vegetables, lean proteins, and minimizing the intake of red meats and processed foods.  She was also encouraged to engage in moderate to vigorous exercise for 30 minutes per day most days of the week. We discussed the [employer] fitness program, which is designed for cancer survivors to help them become more physically fit after cancer treatments.  She was instructed to limit her alcohol  consumption and continue to abstain from tobacco use.  A copy of the "Take Control of Your Health" brochure was given to her reinforcing these recommendations.   4. Support services/counseling: It is not uncommon for this period of the patient's cancer care trajectory to be one of many emotions and stressors. We discussed an opportunity for her to participate in the next session of [facility] ("Finding Your New Normal") support group series designed for patients after they have completed treatment.  Kim Armstrong was encouraged to take advantage of our many other support services programs, support groups, and/or counseling in coping with her new life as a cancer survivor after completing anti-cancer treatment.  She was offered support today through active listening and expressive  supportive counseling.  She was given information regarding our available services and encouraged to contact me with any questions or for help enrolling in any of our support group/programs.    A total of 50 minutes of face-to-face time was spent with this patient with greater than 50% of that time in counseling and care-coordination.   Sylvan Cheese, NP  Survivorship Program [facility] [phone]   Note: PRIMARY CARE PROVIDER Thurnell Lose, [place] [phone]

## 2015-12-14 ENCOUNTER — Other Ambulatory Visit: Payer: Self-pay | Admitting: *Deleted

## 2015-12-14 ENCOUNTER — Telehealth: Payer: Self-pay | Admitting: Hematology and Oncology

## 2015-12-14 ENCOUNTER — Telehealth: Payer: Self-pay | Admitting: *Deleted

## 2015-12-14 NOTE — Telephone Encounter (Signed)
MD visit added per 02/06 POF pt is aware.Marland Kitchen..KJ

## 2015-12-14 NOTE — Telephone Encounter (Signed)
"  I finished chemotherapy but have noticed a pain in my left breast the past week.  It's tender or hurts next to left nipple radiating toward the chest wall.  Do I need to be seen or have imaging?"  Return number [phone].

## 2015-12-14 NOTE — Telephone Encounter (Signed)
Discussed with Dr. Lindi Adie, pof sent to schedule appt for 2/9 1:45. Patient aware of date/time.

## 2015-12-17 ENCOUNTER — Ambulatory Visit (HOSPITAL_BASED_OUTPATIENT_CLINIC_OR_DEPARTMENT_OTHER): Payer: Managed Care, Other (non HMO) | Admitting: Hematology and Oncology

## 2015-12-17 ENCOUNTER — Encounter: Payer: Self-pay | Admitting: Hematology and Oncology

## 2015-12-17 VITALS — BP 107/77 | HR 70 | Temp 97.5°F | Resp 18 | Ht 67.0 in | Wt 148.3 lb

## 2015-12-17 DIAGNOSIS — N644 Mastodynia: Secondary | ICD-10-CM | POA: Diagnosis not present

## 2015-12-17 DIAGNOSIS — C50412 Malignant neoplasm of upper-outer quadrant of left female breast: Secondary | ICD-10-CM

## 2015-12-17 NOTE — Assessment & Plan Note (Addendum)
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  Pathology: Lumpectomy at [facility] on 01/29/2015: residual microscopic invasive ductal carcinoma spanning 3.2 cm T2 N0 M0 Pathological stage II a  Treatment summary: Neoadjuvant chemotherapy with Taxotere, carboplatin, Herceptin and Perjeta given once every 3 weeks from 09/19/2014 to 01/02/2015 .Adjuvant radiation therapy completed June 2016; started tamoxifen 06/05/2015 Cardiac monitoring: echocardiogram October 2015 EF 55%; 01/06/2015 EF 60-65%; 05/20/2015 EF 60-65% Current treatment:  1. Herceptin every 3 weeks; today's last Herceptin 2. Tamoxifen 20 mg daily started 06/05/2015  Breast Cancer Surveillance: 1. Breast exam 12/17/15:  Tenderness in the left breast upper outer quadrant extending from the nipple superiorly. No palpable lumps. 2. Mammogram Nov 2017: Normal, Post-op changes Density Cat B.   Tamoxifen toxicities: Denies any hot flashes or myalgias or any side effects from tamoxifen she does feel mildly fatigued Patient is participating in vigorous physical activity with the help of a trainer.  Return to clinic in 6 months for follow-up

## 2015-12-17 NOTE — Progress Notes (Signed)
Patient Care Team: Thurnell Lose, MD as PCP - General (Obstetrics and Gynecology) Thurnell Lose, MD (Obstetrics and Gynecology) Erroll Luna, MD as Consulting Physician (General Surgery) Nicholas Lose, MD as Consulting Physician (Hematology and Oncology) Thea Silversmith, MD as Consulting Physician (Radiation Oncology) Sylvan Cheese, NP as Nurse Practitioner (Hematology and Oncology) Cornelia Copa, DO as Referring Physician (Surgical Oncology)  DIAGNOSIS: Breast cancer of upper-outer quadrant of left female breast [facility])   Staging form: Breast, AJCC 7th Edition     Clinical: Stage IIA (T2, N0, cM0) - Unsigned       Staging comments: Staged at breast conference on 10.21.15      Pathologic stage from 01/29/2015: No stage assigned - Unsigned   SUMMARY OF ONCOLOGIC HISTORY:   Breast cancer of upper-outer quadrant of left female breast ([place])   08/20/2014 Initial Biopsy Left breast bx: Invasive ductal carcinoma grade 3, ER+ (100%), PR+ (89%) HER-2 positive ratio 3.5, Ki-67 25%; biopsy of satellite lesion fibroadenoma   08/26/2014 Breast MRI Left breast: Upper-outer quadrant 1.4 cm from nipple 1.9 x 2.5 x 2.6 cm enhancing mass with the 8 mm nodule 1 cm posterior Right breast: 1.4 x 1.6 cm mass upper outer quadrant biopsy proven fibroadenoma   08/26/2014 Clinical Stage Stage IIA: T2 N0   09/19/2014 -  Neo-Adjuvant Chemotherapy Neoadjuvant chemotherapy with Taxotere, carboplatin, Herceptin and Perjeta x6 cycles followed by Herceptin maintenance to be complete November 2016   09/26/2014 Procedure 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 Otherwise negative at ATM, BARD1, BRCA1, BRCA2, BRIP1, CDH1, CHEK2, EPCAM, MLH1, MRE11A, MSH2, MSH6, MUTYH, NBN, NF1, PALB2, PMS2, PTEN, RAD50, RAD51C, RAD51D, SMARCA4, STK11, and TP53.   01/09/2015 Breast MRI Interval marked response to chemotherapy with significant decrease in the size of the tumor, satellite nodule is now vaguely  visible, 2.6 cm mass is now 0.9 cm   01/29/2015 Definitive Surgery Left breast lumpectomy/SLNB by Dr. Ernst Bowler at [facility]; 3.2 cm area of microscopic IDC cells, 2 sentinel lymph nodes negative (0/2). 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  Left breast boost/ 16 Gy at 2 Gy per fraction x 8 fractions   06/05/2015 -  Anti-estrogen oral therapy Tamoxifen 20 mg daily. Planned duration of treatment 5-10 years.   11/03/2015 Survivorship Survivorship care visit completed and copy of care plan provided to patient.    CHIEF COMPLIANT: follow-up on tamoxifen complaints of left breast discomfort  INTERVAL HISTORY: Kim Armstrong is a 40 year old with above-mentioned history of ER positive HER-2 positive breast cancer treated with neoadjuvant chemotherapy followed by lumpectomy and adjuvant radiation. She is currently on tamoxifen and appears to be tolerating tamoxifen extremely well. She denies any hot flashes and myalgias. She does complain of pain in the left breast intermittently. Lately has been more painful than before. The pain appears to wax and wane. It appears that worse during menstrual cycles and then gets better later.  REVIEW OF SYSTEMS:   Constitutional: Denies fevers, chills or abnormal weight loss Eyes: Denies blurriness of vision Ears, nose, mouth, throat, and face: Denies mucositis or sore throat Respiratory: Denies cough, dyspnea or wheezes Cardiovascular: Denies palpitation, chest discomfort Gastrointestinal:  Denies nausea, heartburn or change in bowel habits Skin: Denies abnormal skin rashes Lymphatics: Denies new lymphadenopathy or easy bruising Neurological:Denies numbness, tingling or new weaknesses Behavioral/Psych:  Mood is stable, no new changes  Extremities: No lower extremity edema Breast: pain in the left  breast All other systems were reviewed with the patient and are negative.  I have reviewed the past medical history, past surgical history, social history and family history with the patient and they are unchanged from previous note.  ALLERGIES:  has No Known Allergies.  MEDICATIONS:  Current Outpatient Prescriptions  Medication Sig Dispense Refill  . acetaminophen (TYLENOL) 500 MG tablet Take 1,000 mg by mouth every 6 (six) hours as needed for mild pain or moderate pain.    . diphenhydramine-acetaminophen (TYLENOL PM) 25-500 MG TABS Take 1 tablet by mouth at bedtime as needed.    . tamoxifen (NOLVADEX) 20 MG tablet Take 1 tablet (20 mg total) by mouth daily. 90 tablet 3   No current facility-administered medications for this visit.   Facility-Administered Medications Ordered in Other Visits  Medication Dose Route Frequency Provider Last Rate Last Dose  . diphenhydrAMINE (BENADRYL) capsule 50 mg  50 mg Oral Once Nicholas Lose, MD   50 mg at 04/17/15 1527  . sodium chloride 0.9 % injection 10 mL  10 mL Intracatheter PRN Nicholas Lose, MD   10 mL at 04/17/15 1527    PHYSICAL EXAMINATION: ECOG PERFORMANCE STATUS: 1 - Symptomatic but completely ambulatory  Filed Vitals:   12/17/15 1351  BP: 107/77  Pulse: 70  Temp: 97.5 F (36.4 C)  Resp: 18   Filed Weights   12/17/15 1351  Weight: 148 lb 4.8 oz (67.268 kg)    GENERAL:alert, no distress and comfortable SKIN: skin color, texture, turgor are normal, no rashes or significant lesions EYES: normal, Conjunctiva are pink and non-injected, sclera clear OROPHARYNX:no exudate, no erythema and lips, buccal mucosa, and tongue normal  NECK: supple, thyroid normal size, non-tender, without nodularity LYMPH:  no palpable lymphadenopathy in the cervical, axillary or inguinal LUNGS: clear to auscultation and percussion with normal breathing effort HEART: regular rate & rhythm and no murmurs and no lower extremity edema ABDOMEN:abdomen soft,  non-tender and normal bowel sounds MUSCULOSKELETAL:no cyanosis of digits and no clubbing  NEURO: alert & oriented x 3 with fluent speech, no focal motor/sensory deficits EXTREMITIES: No lower extremity edema BREAST:left breast tenderness. No skin changes no palpable lumps or nodules.. (exam performed in the presence of a chaperone)  LABORATORY DATA:  I have reviewed the data as listed   Chemistry      Component Value Date/Time   NA 140 08/28/2015 0907   K 4.1 08/28/2015 0907   CO2 23 08/28/2015 0907   BUN 10.0 08/28/2015 0907   CREATININE 0.8 08/28/2015 0907      Component Value Date/Time   CALCIUM 9.3 08/28/2015 0907   ALKPHOS 43 08/28/2015 0907   AST 12 08/28/2015 0907   ALT <9 08/28/2015 0907   BILITOT <0.30 08/28/2015 0907      Lab Results  Component Value Date   WBC 2.4* 09/10/2015   HGB 12.1 09/10/2015   HCT 35.6* 09/10/2015   MCV 90.4 09/10/2015   PLT 175 09/10/2015   NEUTROABS 1.3* 09/10/2015   ASSESSMENT & PLAN:  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  Pathology: Lum[facility]omy at [facility] on 01/29/2015: residual microscopic invasive ductal carcinoma spanning 3.2 cm T2 N0 M0 Pathological stage II a  Treatment summary: Neoadjuvant  chemotherapy with Taxotere, carboplatin, Herceptin and Perjeta given once every 3 weeks from 09/19/2014 to 01/02/2015 .Adjuvant radiation therapy completed June 2016; started tamoxifen 06/05/2015 Cardiac monitoring: echocardiogram October 2015 EF 55%; 01/06/2015 EF 60-65%; 05/20/2015 EF 60-65% Current treatment:  1. Herceptin every 3 weeks; today's last Herceptin 2. Tamoxifen 20 mg daily started 06/05/2015  Breast Cancer Surveillance: 1. Breast exam 12/17/15:  Tenderness in the left breast upper outer quadrant extending from the nipple  superiorly. No palpable lumps. 2. Mammogram Nov 2017: Normal, Post-op changes Density Cat B.   Tamoxifen toxicities: Denies any hot flashes or myalgias or any side effects from tamoxifen she does feel mildly fatigued Patient is participating in vigorous physical activity with the help of a trainer.  Return to clinic in 6 months for follow-up   No orders of the defined types were placed in this encounter.   The patient has a good understanding of the overall plan. she agrees with it. she will call with any problems that may develop before the next visit here.   Rulon Eisenmenger, MD 12/17/2015

## 2015-12-19 ENCOUNTER — Telehealth: Payer: Self-pay | Admitting: Hematology and Oncology

## 2015-12-19 NOTE — Telephone Encounter (Signed)
Left message for patient re 8/10 f/u. Schedule mailed.

## 2016-01-07 ENCOUNTER — Encounter: Payer: Self-pay | Admitting: Adult Health

## 2016-01-07 NOTE — Progress Notes (Signed)
A birthday card was mailed to the patient today on behalf of the Survivorship Program at [facility].   Raju Coppolino, NP Survivorship Program [facility] [phone]  

## 2016-03-04 ENCOUNTER — Ambulatory Visit: Payer: Managed Care, Other (non HMO) | Admitting: Hematology and Oncology

## 2016-06-16 ENCOUNTER — Encounter: Payer: Self-pay | Admitting: Hematology and Oncology

## 2016-06-16 ENCOUNTER — Telehealth: Payer: Self-pay | Admitting: Hematology and Oncology

## 2016-06-16 ENCOUNTER — Ambulatory Visit (HOSPITAL_BASED_OUTPATIENT_CLINIC_OR_DEPARTMENT_OTHER): Payer: Managed Care, Other (non HMO) | Admitting: Hematology and Oncology

## 2016-06-16 DIAGNOSIS — C50412 Malignant neoplasm of upper-outer quadrant of left female breast: Secondary | ICD-10-CM

## 2016-06-16 DIAGNOSIS — R5383 Other fatigue: Secondary | ICD-10-CM | POA: Diagnosis not present

## 2016-06-16 NOTE — Telephone Encounter (Signed)
appt made for one year per VG

## 2016-06-16 NOTE — Assessment & Plan Note (Signed)
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  Pathology: Lumpectomy at [facility] on 01/29/2015: residual microscopic invasive ductal carcinoma spanning 3.2 cm T2 N0 M0 Pathological stage II a  Treatment summary: Neoadjuvant chemotherapy with Taxotere, carboplatin, Herceptin and Perjeta given once every 3 weeks from 09/19/2014 to 01/02/2015 .Adjuvant radiation therapy completed June 2016; Herceptin every 3 weeks completed 12/17/2015 started tamoxifen 06/05/2015 Cardiac monitoring: echocardiogram October 2015 EF 55%; 01/06/2015 EF 60-65%; 05/20/2015 EF 60-65% Current treatment: Tamoxifen 20 mg daily started 06/05/2015  Breast Cancer Surveillance: 1. Breast exam 06/16/16:  Tenderness in the left breast upper outer quadrant extending from the nipple superiorly. No palpable lumps. 2. Mammogram Nov 2016: Normal, Post-op changes Density Cat B.   Tamoxifen toxicities: Denies any hot flashes or myalgias or any side effects from tamoxifen she does feel mildly fatigued Patient is participating in vigorous physical activity with the help of a trainer.  Return to clinic in 6 months for follow-up.

## 2016-06-16 NOTE — Progress Notes (Signed)
Patient Care Team: Thurnell Lose, MD as PCP - General (Obstetrics and Gynecology) Thurnell Lose, MD (Obstetrics and Gynecology) Erroll Luna, MD as Consulting Physician (General Surgery) Nicholas Lose, MD as Consulting Physician (Hematology and Oncology) Thea Silversmith, MD as Consulting Physician (Radiation Oncology) Sylvan Cheese, NP as Nurse Practitioner (Hematology and Oncology) Cornelia Copa, DO as Referring Physician (Surgical Oncology)  DIAGNOSIS: Breast cancer of upper-outer quadrant of left female breast [facility])   Staging form: Breast, AJCC 7th Edition   - Clinical: Stage IIA (T2, N0, cM0) - Unsigned         Staging comments: Staged at breast conference on 10.21.15    - Pathologic stage from 01/29/2015: No stage assigned - Unsigned  SUMMARY OF ONCOLOGIC HISTORY:   Breast cancer of upper-outer quadrant of left female breast ([place])   08/20/2014 Initial Biopsy    Left breast bx: Invasive ductal carcinoma grade 3, ER+ (100%), PR+ (89%) HER-2 positive ratio 3.5, Ki-67 25%; biopsy of satellite lesion fibroadenoma     08/26/2014 Breast MRI    Left breast: Upper-outer quadrant 1.4 cm from nipple 1.9 x 2.5 x 2.6 cm enhancing mass with the 8 mm nodule 1 cm posterior Right breast: 1.4 x 1.6 cm mass upper outer quadrant biopsy proven fibroadenoma     08/26/2014 Clinical Stage    Stage IIA: T2 N0     09/19/2014 -  Neo-Adjuvant Chemotherapy    Neoadjuvant chemotherapy with Taxotere, carboplatin, Herceptin and Perjeta x6 cycles followed by Herceptin maintenance to be complete November 2016     09/26/2014 Procedure    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 Otherwise negative at ATM, BARD1, BRCA1, BRCA2, BRIP1, CDH1, CHEK2, EPCAM, MLH1, MRE11A, MSH2, MSH6, MUTYH, NBN, NF1, PALB2, PMS2, PTEN, RAD50, RAD51C, RAD51D, SMARCA4, STK11, and TP53.     01/09/2015 Breast MRI    Interval marked response to chemotherapy with significant decrease in the size of the  tumor, satellite nodule is now vaguely visible, 2.6 cm mass is now 0.9 cm     01/29/2015 Definitive Surgery    Left breast lumpectomy/SLNB by Dr. Ernst Bowler at [facility]; 3.2 cm area of microscopic IDC cells, 2 sentinel lymph nodes negative (0/2). DCIS, int to high grade     01/29/2015 Pathologic Stage    Stage IIA: ypT2 ypN0      03/10/2015 - 04/27/2015 Radiation Therapy    Adjuvant RT Pablo Ledger): Left breast/ 45 Gy at 1.8 Gy per fraction x 25 fractions.  Left breast boost/ 16 Gy at 2 Gy per fraction x 8 fractions     06/05/2015 -  Anti-estrogen oral therapy    Tamoxifen 20 mg daily. Planned duration of treatment 5-10 years.     11/03/2015 Survivorship    Survivorship care visit completed and copy of care plan provided to patient.      CHIEF COMPLIANT: Follow-up on tamoxifen therapy  INTERVAL HISTORY: Kim Armstrong is a 40 year old with above-mentioned history of left breast cancer treated with neoadjuvant chemotherapy followed by left lumpectomy and adjuvant radiation. She is currently on tamoxifen therapy since July 2016. She appears to be tolerating it fairly well. She denies any hot flashes or myalgias. She does have mild fatigue. She denies any lumps or nodules in the breasts.  REVIEW OF SYSTEMS:   Constitutional: Denies fevers, chills or abnormal weight loss Eyes: Denies blurriness of vision Ears, nose, mouth, throat, and face: Denies mucositis or sore throat Respiratory: Denies cough, dyspnea or wheezes Cardiovascular: Denies palpitation, chest  discomfort Gastrointestinal:  Denies nausea, heartburn or change in bowel habits Skin: Denies abnormal skin rashes Lymphatics: Denies new lymphadenopathy or easy bruising Neurological:Denies numbness, tingling or new weaknesses Behavioral/Psych: Mood is stable, no new changes  Extremities: No lower extremity edema Breast:  denies any pain or lumps or nodules in either breasts All other systems were reviewed with the patient and  are negative.  I have reviewed the past medical history, past surgical history, social history and family history with the patient and they are unchanged from previous note.  ALLERGIES:  has No Known Allergies.  MEDICATIONS:  Current Outpatient Prescriptions  Medication Sig Dispense Refill  . acetaminophen (TYLENOL) 500 MG tablet Take 1,000 mg by mouth every 6 (six) hours as needed for mild pain or moderate pain.    . diphenhydramine-acetaminophen (TYLENOL PM) 25-500 MG TABS Take 1 tablet by mouth at bedtime as needed.    . tamoxifen (NOLVADEX) 20 MG tablet Take 1 tablet (20 mg total) by mouth daily. 90 tablet 3   No current facility-administered medications for this visit.    Facility-Administered Medications Ordered in Other Visits  Medication Dose Route Frequency Provider Last Rate Last Dose  . diphenhydrAMINE (BENADRYL) capsule 50 mg  50 mg Oral Once Nicholas Lose, MD      . sodium chloride 0.9 % injection 10 mL  10 mL Intracatheter PRN Nicholas Lose, MD   10 mL at 04/17/15 1527    PHYSICAL EXAMINATION: ECOG PERFORMANCE STATUS: 0 - Asymptomatic  Vitals:   06/16/16 0920  BP: 110/80  Pulse: 88  Resp: 18  Temp: 98.5 F (36.9 C)   Filed Weights   06/16/16 0920  Weight: 156 lb (70.8 kg)    GENERAL:alert, no distress and comfortable SKIN: skin color, texture, turgor are normal, no rashes or significant lesions EYES: normal, Conjunctiva are pink and non-injected, sclera clear OROPHARYNX:no exudate, no erythema and lips, buccal mucosa, and tongue normal  NECK: supple, thyroid normal size, non-tender, without nodularity LYMPH:  no palpable lymphadenopathy in the cervical, axillary or inguinal LUNGS: clear to auscultation and percussion with normal breathing effort HEART: regular rate & rhythm and no murmurs and no lower extremity edema ABDOMEN:abdomen soft, non-tender and normal bowel sounds MUSCULOSKELETAL:no cyanosis of digits and no clubbing  NEURO: alert & oriented x 3  with fluent speech, no focal motor/sensory deficits EXTREMITIES: No lower extremity edema BREAST: No palpable masses or nodules in either right or left breasts. No palpable axillary supraclavicular or infraclavicular adenopathy no breast tenderness or nipple discharge. (exam performed in the presence of a chaperone)  LABORATORY DATA:  I have reviewed the data as listed   Chemistry      Component Value Date/Time   NA 140 08/28/2015 0907   K 4.1 08/28/2015 0907   CO2 23 08/28/2015 0907   BUN 10.0 08/28/2015 0907   CREATININE 0.8 08/28/2015 0907      Component Value Date/Time   CALCIUM 9.3 08/28/2015 0907   ALKPHOS 43 08/28/2015 0907   AST 12 08/28/2015 0907   ALT <9 08/28/2015 0907   BILITOT <0.30 08/28/2015 1007       Lab Results  Component Value Date   WBC 2.4 (L) 09/10/2015   HGB 12.1 09/10/2015   HCT 35.6 (L) 09/10/2015   MCV 90.4 09/10/2015   PLT 175 09/10/2015   NEUTROABS 1.3 (L) 09/10/2015     ASSESSMENT & PLAN:  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  Pathology: Lumpectomy at [facility] on 01/29/2015: residual microscopic invasive ductal carcinoma spanning 3.2 cm T2 N0 M0 Pathological stage II a  Treatment summary: Neoadjuvant chemotherapy with Taxotere, carboplatin, Herceptin and Perjeta given once every 3 weeks from 09/19/2014 to 01/02/2015 .Adjuvant radiation therapy completed June 2016; Herceptin every 3 weeks completed 12/17/2015 started tamoxifen 06/05/2015 Cardiac monitoring: echocardiogram October 2015 EF 55%; 01/06/2015 EF 60-65%; 05/20/2015 EF 60-65% Current treatment: Tamoxifen 20 mg daily started 06/05/2015  Breast Cancer Surveillance: 1. Breast exam 06/16/16:  Tenderness in the left breast upper outer quadrant extending from the nipple superiorly. No  palpable lumps. 2. Mammogram Nov 2016: Normal, Post-op changes Density Cat B.   Tamoxifen toxicities: Denies any hot flashes or myalgias or any side effects from tamoxifen she does feel mildly fatigued Patient is participating in vigorous physical activity with the help of a trainer. Patient is receiving acupuncture for her back and it appears to be helping her.  We discussed at length about the risks and benefits of Neratinib an oral tyrosine kinase inhibitor on her-2 which was recently approved.I discussed risks and benefits and we elected not to pursue the treatment. Return to clinic in 1 year for follow-up.   No orders of the defined types were placed in this encounter.  The patient has a good understanding of the overall plan. she agrees with it. she will call with any problems that may develop before the next visit here.   Rulon Eisenmenger, MD 06/16/16

## 2016-07-13 ENCOUNTER — Other Ambulatory Visit: Payer: Self-pay | Admitting: Hematology and Oncology

## 2016-07-13 DIAGNOSIS — C50412 Malignant neoplasm of upper-outer quadrant of left female breast: Secondary | ICD-10-CM

## 2016-10-29 IMAGING — MR MR BREAST BILATERAL W WO CONTRAST
6 of 14 series · 18 of 48 positions shown · IV contrast (13)
Comparison: MRI 08/26/2014 [HOSPITAL].

CLINICAL DATA: Biopsy proven invasive ductal carcinoma in the upper
outer quadrant of the left breast measuring 2.6 cm. Satellite 8 mm
nodule in the upper outer quadrant of the left breast identified on
the pre treatment MRI in August 2014. Biopsy-proven fibroadenoma
in the upper outer quadrant of the right breast. Patient has
recently completed neoadjuvant chemotherapy. Post treatment MRI is
requested prior to definitive surgery.

Family history of breast cancer in her mother at age 37.
LABS:  Not applicable.
EXAM:
BILATERAL BREAST MRI WITH AND WITHOUT CONTRAST
TECHNIQUE: Multiplanar, multisequence MR images of both breasts were obtained
prior to and following the intravenous administration of 13 ml of
Multihance.

[Series 1: acess#(phone_number) · axial · 7.0mm · 1.56mm/px · 1 of 25 slices shown]
[im 1/25]
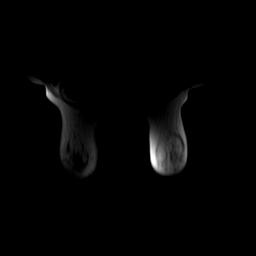

[Series 3: T2 · axial · 3.0mm · 0.66mm/px · 1 of 67 slices shown]
[im 1/67]
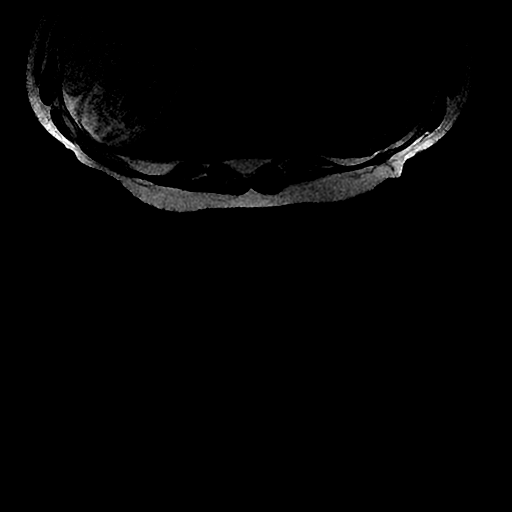

[Series 5: ax ir · axial · 3.0mm · 0.66mm/px · 1 of 72 slices shown]
[im 1/72]
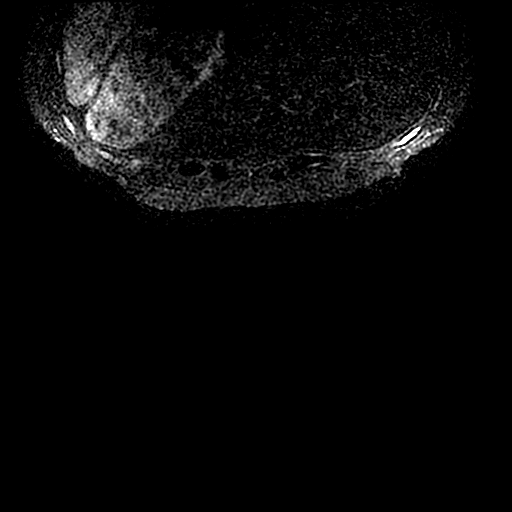

[Series 700: (id) vibrant mph · axial · 1.8mm · 0.70mm/px · z∈[-133,+82]mm · 5 of 236 slices shown]
[im 1/236]
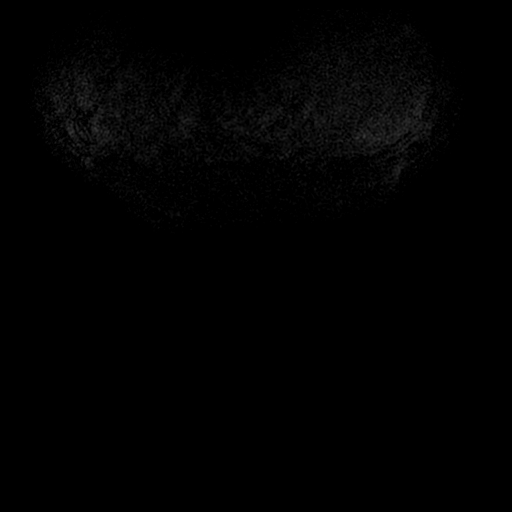
[im 59/236]
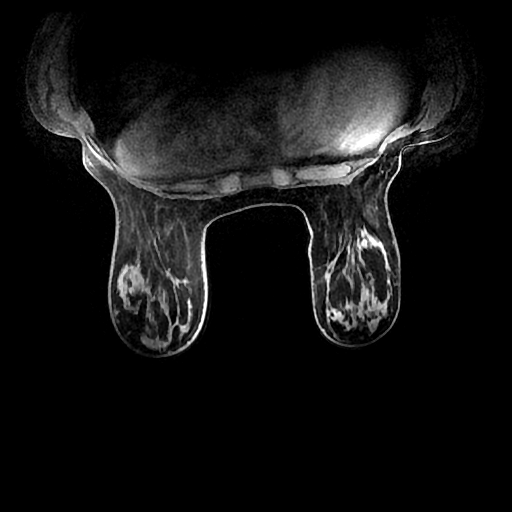
[im 118/236]
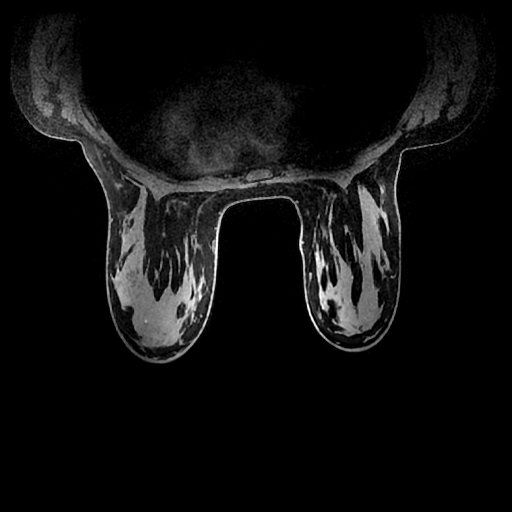
[im 177/236]
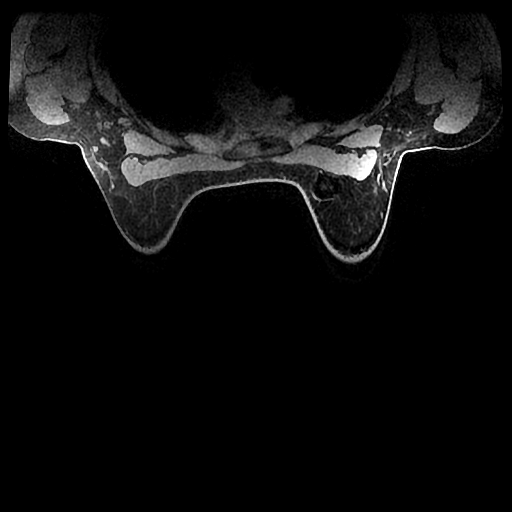
[im 236/236]
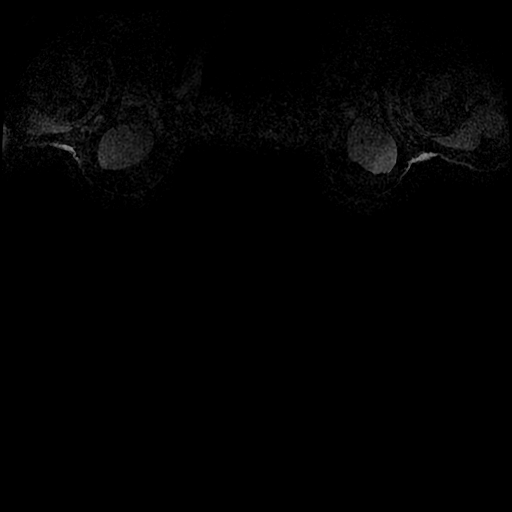

[Series 701: ph1/ (id) vibrant · axial · 1.8mm · 0.70mm/px · z∈[-133,+82]mm · 5 of 240 slices shown]
[im 1/240]
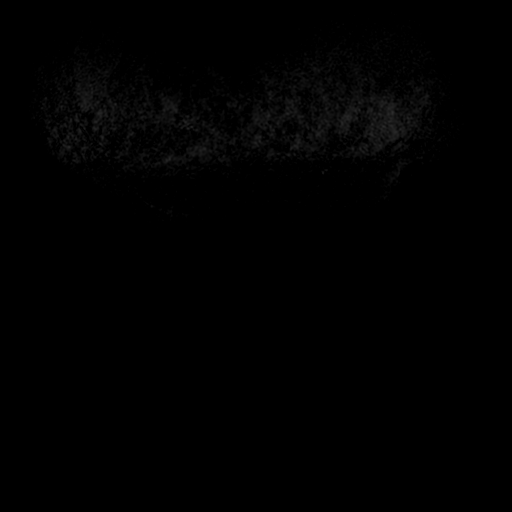
[im 60/240]
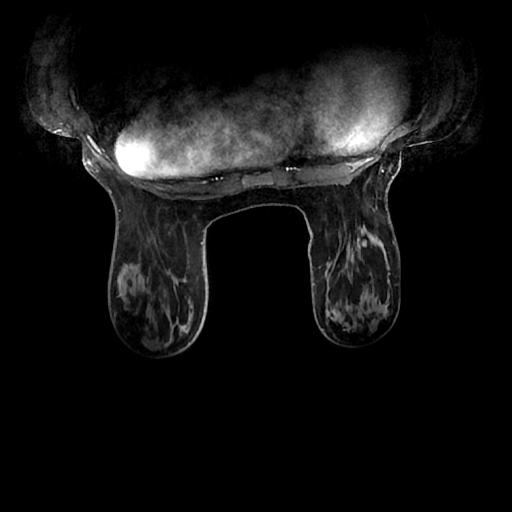
[im 120/240]
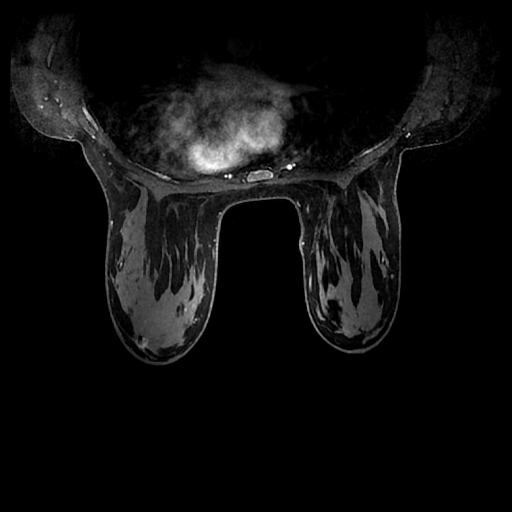
[im 180/240]
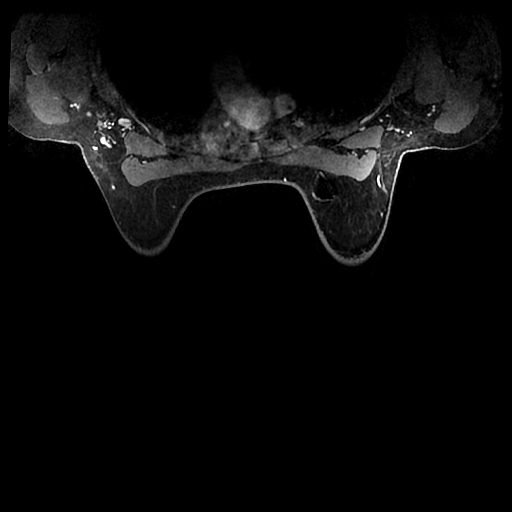
[im 240/240]
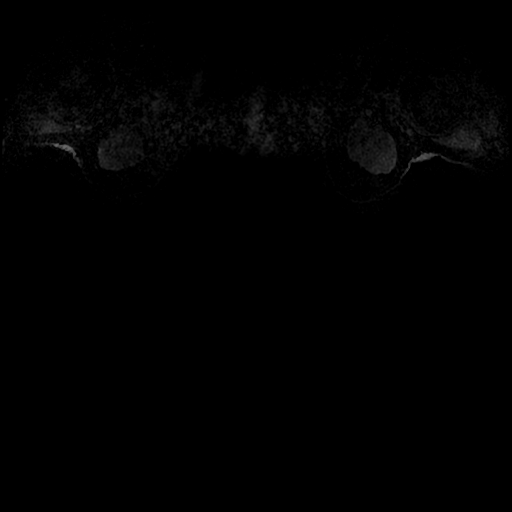

[Series 702: ph2/ (id) vibrant · axial · 1.8mm · 0.70mm/px · z∈[-133,+39]mm · 5 of 240 slices shown]
[im 1/240]
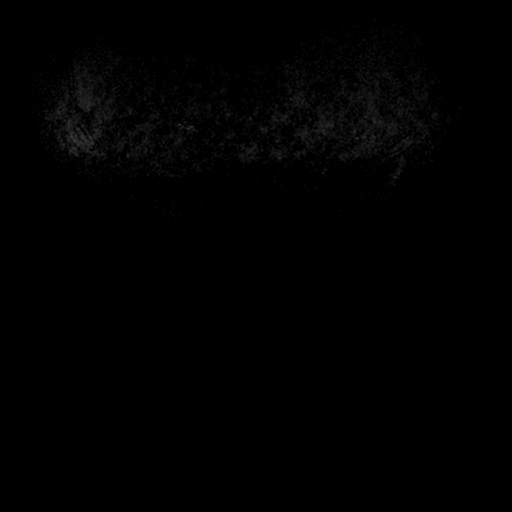
[im 48/240]
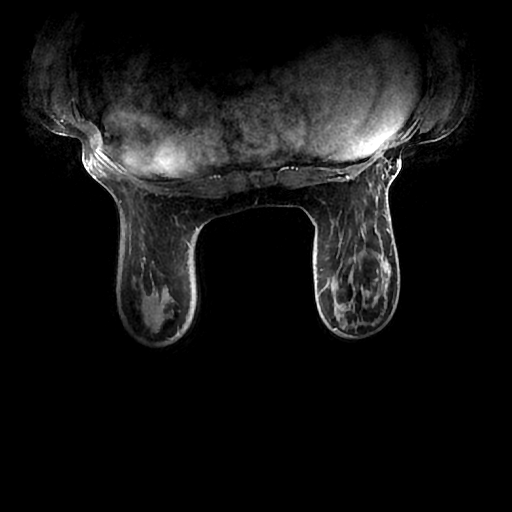
[im 96/240]
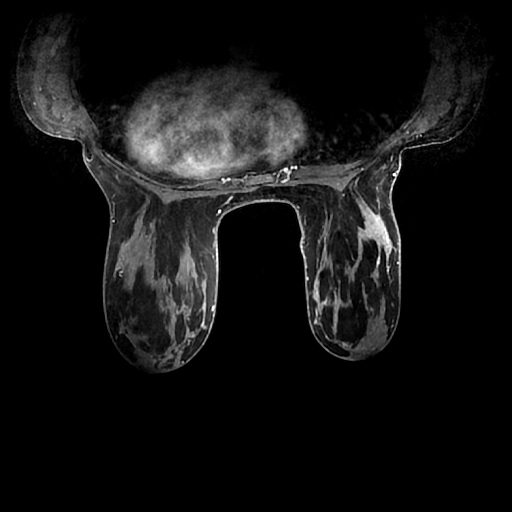
[im 144/240]
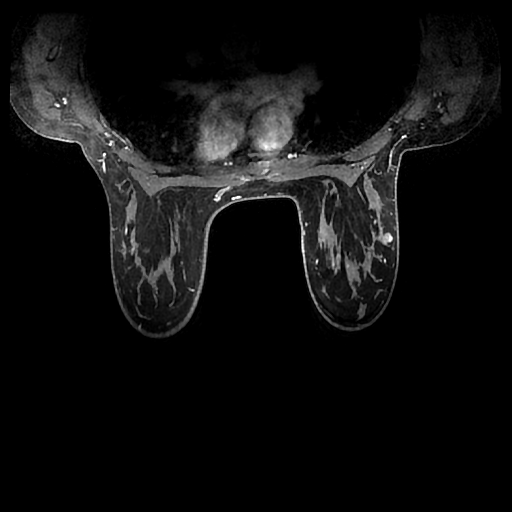
[im 192/240]
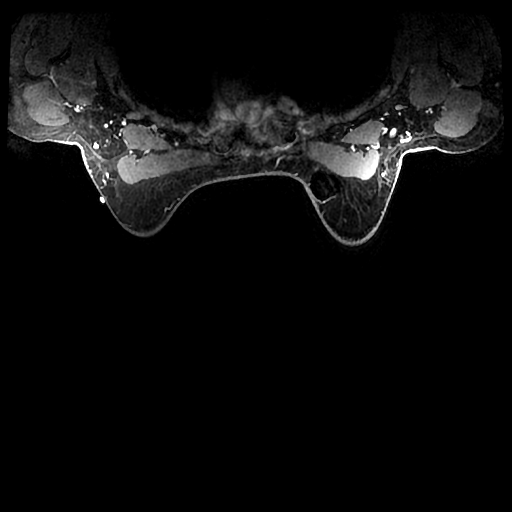

[18 of 48 positions shown; findings below may reference images not displayed]

THREE-DIMENSIONAL MR IMAGE RENDERING ON INDEPENDENT WORKSTATION:

Three-dimensional MR images were rendered by post-processing of the
original MR data on an independent workstation. The
three-dimensional MR images were interpreted, and findings are
reported in the following complete MRI report for this study. Three
dimensional images were evaluated at the independent [employer]
workstation.
Mammography
08/20/2014 dating back to 04/05/2011 and bilateral breast ultrasound
08/20/2014 dating back to 04/05/2011, performed at [facility].
FINDINGS: Breast composition: c:  Heterogeneous fibroglandular tissue

Background parenchymal enhancement: Moderate.

Right breast: Biopsy-proven fibroadenoma in the upper outer
quadrant, middle depth, with clip artifact, measuring approximately
1.3 x 0.8 x 1.4 cm. No abnormal enhancement to suggest malignancy.

Left breast: Interval marked decrease in size of the biopsy proven
invasive ductal carcinoma in the upper outer quadrant, anterior
depth, current measurements approximating 0.6 x 0.9 x 0.8 cm
(previously 1.9 x 2.5 x 2.6 cm), now demonstrating plateau kinetics.
The satellite nodule posterior to the dominant mass in the upper
outer quadrant is only vaguely visible currently. No new or
suspicious findings elsewhere in the left breast.

Lymph nodes: No pathologic lymphadenopathy.

Ancillary findings:  None.
IMPRESSION: 1. Interval marked response to chemotherapy with significant
decrease in size of the biopsy-proven invasive ductal carcinoma in
the upper outer quadrant of the left breast, anterior depth.
Measurements given above.
2. The satellite nodule immediately posterior to the dominant mass
is now only vaguely visible, also demonstrating interval response to
chemotherapy.
3. Biopsy-proven fibroadenoma in the upper outer quadrant of the
right breast, middle depth.
4. No new or suspicious findings elsewhere in either breast.
5. No pathologic lymphadenopathy.

RECOMMENDATION:
Treatment plan.

BI-RADS CATEGORY  6: Known biopsy-proven malignancy.

## 2016-11-11 ENCOUNTER — Other Ambulatory Visit: Payer: Self-pay | Admitting: Nurse Practitioner

## 2016-11-22 NOTE — Progress Notes (Signed)
Received [facility] results I sent the Rx to the pharmacy. To scan

## 2017-03-02 ENCOUNTER — Other Ambulatory Visit: Payer: Self-pay

## 2017-03-02 DIAGNOSIS — C50412 Malignant neoplasm of upper-outer quadrant of left female breast: Secondary | ICD-10-CM

## 2017-03-02 MED ORDER — TAMOXIFEN CITRATE 20 MG PO TABS: 20.0000 mg | ORAL_TABLET | Freq: Every day | ORAL | 3 refills | Status: DC

## 2017-04-12 ENCOUNTER — Other Ambulatory Visit: Payer: Self-pay

## 2017-04-12 DIAGNOSIS — C50412 Malignant neoplasm of upper-outer quadrant of left female breast: Secondary | ICD-10-CM

## 2017-04-12 NOTE — Progress Notes (Signed)
Pt called in to report increasing headaches for about 3 months now. The last 3 to 4 weeks have been progressively worse. Pt had gone to her pcp a few months back and was prescribed immitrex. Pt states that it doesn't help as much anymore. Does get nauseated with the headaches. Pt denies any changes to medication or lifestyle. Pt keeps hydrated and takes care of her self physically. Pt does state that she is undergoing some stressful situations at work, but the headaches had been ongoing before all the stress happened. Discussed with Dr.Gudena and obtained order for MRI brain. Notified pt and agreeable to plan. Set pt up for an appt in the next few weeks to see Dr.Gudena and discuss mri results. Will send for pre auth for test and will schedule pt this week or next week. Pt verbalized understanding.

## 2017-04-18 ENCOUNTER — Ambulatory Visit (HOSPITAL_COMMUNITY): Admission: RE | Admit: 2017-04-18 | Payer: PRIVATE HEALTH INSURANCE | Source: Ambulatory Visit

## 2017-04-19 ENCOUNTER — Other Ambulatory Visit: Payer: Self-pay

## 2017-04-19 DIAGNOSIS — C50412 Malignant neoplasm of upper-outer quadrant of left female breast: Secondary | ICD-10-CM

## 2017-04-19 NOTE — Progress Notes (Signed)
Pt reports that her insurance will only cover her MRI of the brain if imaging done at [facility] in [place] and faxed over MRI orders. They will be calling the pt to schedule appt for brain MRI. Pt verbalized understanding and will wait for scheduled appt.

## 2017-05-02 ENCOUNTER — Ambulatory Visit (HOSPITAL_BASED_OUTPATIENT_CLINIC_OR_DEPARTMENT_OTHER): Payer: PRIVATE HEALTH INSURANCE | Admitting: Hematology and Oncology

## 2017-05-02 ENCOUNTER — Encounter: Payer: Self-pay | Admitting: Hematology and Oncology

## 2017-05-02 DIAGNOSIS — R51 Headache: Secondary | ICD-10-CM

## 2017-05-02 DIAGNOSIS — R5383 Other fatigue: Secondary | ICD-10-CM | POA: Diagnosis not present

## 2017-05-02 DIAGNOSIS — C50412 Malignant neoplasm of upper-outer quadrant of left female breast: Secondary | ICD-10-CM

## 2017-05-02 DIAGNOSIS — Z170 Estrogen receptor positive status [ER+]: Secondary | ICD-10-CM

## 2017-05-02 NOTE — Assessment & Plan Note (Addendum)
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  Pathology: Lumpectomy at [facility] on 01/29/2015: residual microscopic invasive ductal carcinoma spanning 3.2 cm T2 N0 M0 Pathological stage II a  Treatment summary: Neoadjuvant chemotherapy with Taxotere, carboplatin, Herceptin and Perjeta given once every 3 weeks from 09/19/2014 to 01/02/2015 .Adjuvant radiation therapy completed June 2016; Herceptin every 3 weeks completed 12/17/2015 started tamoxifen 06/05/2015 Cardiac monitoring: echocardiogram October 2015 EF 55%; 01/06/2015 EF 60-65%; 05/20/2015 EF 60-65% Current treatment: Tamoxifen 20 mg daily started 06/05/2015  Breast Cancer Surveillance: 1. Breast exam 05/02/2017: Tenderness in the left breast upper outer quadrant extending from the nipple superiorly. No palpable lumps. 2. Mammogram Nov 2017: Normal, Post-op changes Density Cat B.   Tamoxifen toxicities: Denies any hot flashes or myalgias or any side effects from tamoxifen she does feel mildly fatigued (patient was offered Neratinib but she refused)  Survivorship: Patient is participating in vigorous physical activity with the help of a trainer. Patient is receiving acupuncture for her back and it appears to be helping her.  Return to clinic in 1 year for follow-up

## 2017-05-02 NOTE — Progress Notes (Signed)
Patient Care Team: Thurnell Lose, MD as PCP - General (Obstetrics and Gynecology) Thurnell Lose, MD (Obstetrics and Gynecology) Erroll Luna, MD as Consulting Physician (General Surgery) Nicholas Lose, MD as Consulting Physician (Hematology and Oncology) Thea Silversmith, MD (Inactive) as Consulting Physician (Radiation Oncology) Sylvan Cheese, NP as Nurse Practitioner (Hematology and Oncology) Cornelia Copa, DO as Referring Physician (Surgical Oncology)  DIAGNOSIS:  Encounter Diagnosis  Name Primary?  . Malignant neoplasm of upper-outer quadrant of left breast in female, estrogen receptor positive ([place])     SUMMARY OF ONCOLOGIC HISTORY:   Breast cancer of upper-outer quadrant of left female breast ([place])   08/20/2014 Initial Biopsy    Left breast bx: Invasive ductal carcinoma grade 3, ER+ (100%), PR+ (89%) HER-2 positive ratio 3.5, Ki-67 25%; biopsy of satellite lesion fibroadenoma      08/26/2014 Breast MRI    Left breast: Upper-outer quadrant 1.4 cm from nipple 1.9 x 2.5 x 2.6 cm enhancing mass with the 8 mm nodule 1 cm posterior Right breast: 1.4 x 1.6 cm mass upper outer quadrant biopsy proven fibroadenoma      08/26/2014 Clinical Stage    Stage IIA: T2 N0      09/19/2014 -  Neo-Adjuvant Chemotherapy    Neoadjuvant chemotherapy with Taxotere, carboplatin, Herceptin and Perjeta x6 cycles followed by Herceptin maintenance to be complete November 2016      09/26/2014 Procedure    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 Otherwise negative at ATM, BARD1, BRCA1, BRCA2, BRIP1, CDH1, CHEK2, EPCAM, MLH1, MRE11A, MSH2, MSH6, MUTYH, NBN, NF1, PALB2, PMS2, PTEN, RAD50, RAD51C, RAD51D, SMARCA4, STK11, and TP53.      01/09/2015 Breast MRI    Interval marked response to chemotherapy with significant decrease in the size of the tumor, satellite nodule is now vaguely visible, 2.6 cm mass is now 0.9 cm      01/29/2015 Definitive Surgery    Left  breast lumpectomy/SLNB by Dr. Ernst Bowler at [facility]; 3.2 cm area of microscopic IDC cells, 2 sentinel lymph nodes negative (0/2). 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  Left breast boost/ 16 Gy at 2 Gy per fraction x 8 fractions      06/05/2015 -  Anti-estrogen oral therapy    Tamoxifen 20 mg daily. Planned duration of treatment 5-10 years.      11/03/2015 Survivorship    Survivorship care visit completed and copy of care plan provided to patient.       CHIEF COMPLIANT: follow-up of recurrent frontal headaches  INTERVAL HISTORY: Kim Armstrong is a 41 year old with above-mentioned history of left breast cancer treated with neoadjuvant chemotherapy followed by lumpectomy. She underwent adjuvant radiation is currently on tamoxifen 20 mg daily. Overall she has been tolerating tamoxifen extremely well. She does not have any hot flashes or myalgias. She is here primarily because of recurrent headaches for the past 6 months. She underwent a brain MRI at [facility] which did not reveal any evidence of metastatic disease. There were some subtle changes for which a three-month follow-up MRI was suggested. She denies any blurring of vision or any other neurological symptoms.  REVIEW OF SYSTEMS:   Constitutional: Denies fevers, chills or abnormal weight loss Eyes: Denies blurriness of vision, recurrent  headaches the frontal region bilaterally Ears, nose, mouth, throat, and face: Denies mucositis or sore throat Respiratory: Denies cough, dyspnea or wheezes Cardiovascular: Denies palpitation, chest discomfort Gastrointestinal:  Denies nausea, heartburn or change in bowel habits Skin: Denies abnormal skin rashes Lymphatics: Denies new lymphadenopathy or easy bruising Neurological:Denies numbness, tingling or new  weaknesses Behavioral/Psych: Mood is stable, no new changes  Extremities: No lower extremity edema Breast:  denies any pain or lumps or nodules in either breasts All other systems were reviewed with the patient and are negative.  I have reviewed the past medical history, past surgical history, social history and family history with the patient and they are unchanged from previous note.  ALLERGIES:  has No Known Allergies.  MEDICATIONS:  Current Outpatient Prescriptions  Medication Sig Dispense Refill  . acetaminophen (TYLENOL) 500 MG tablet Take 1,000 mg by mouth every 6 (six) hours as needed for mild pain or moderate pain.    . diphenhydramine-acetaminophen (TYLENOL PM) 25-500 MG TABS Take 1 tablet by mouth at bedtime as needed.    . tamoxifen (NOLVADEX) 20 MG tablet Take 1 tablet (20 mg total) by mouth daily. 90 tablet 3   No current facility-administered medications for this visit.     PHYSICAL EXAMINATION: ECOG PERFORMANCE STATUS: 1 - Symptomatic but completely ambulatory  Vitals:   05/02/17 1418  BP: 122/76  Pulse: 83  Resp: 17  Temp: 98.2 F (36.8 C)   Filed Weights   05/02/17 1418  Weight: 164 lb 9.6 oz (74.7 kg)    GENERAL:alert, no distress and comfortable SKIN: skin color, texture, turgor are normal, no rashes or significant lesions EYES: normal, Conjunctiva are pink and non-injected, sclera clear OROPHARYNX:no exudate, no erythema and lips, buccal mucosa, and tongue normal  NECK: supple, thyroid normal size, non-tender, without nodularity LYMPH:  no palpable lymphadenopathy in the cervical, axillary or inguinal LUNGS: clear to auscultation and percussion with normal breathing effort HEART: regular rate & rhythm and no murmurs and no lower extremity edema ABDOMEN:abdomen soft, non-tender and normal bowel sounds MUSCULOSKELETAL:no cyanosis of digits and no clubbing  NEURO: alert & oriented x 3 with fluent speech, no focal motor/sensory deficits EXTREMITIES: No  lower extremity edema  LABORATORY DATA:  I have reviewed the data as listed   Chemistry      Component Value Date/Time   NA 140 08/28/2015 0907   K 4.1 08/28/2015 0907   CO2 23 08/28/2015 0907   BUN 10.0 08/28/2015 0907   CREATININE 0.8 08/28/2015 0907      Component Value Date/Time   CALCIUM 9.3 08/28/2015 0907   ALKPHOS 43 08/28/2015 0907   AST 12 08/28/2015 0907   ALT <9 08/28/2015 0907   BILITOT <0.30 08/28/2015 2563       Lab Results  Component Value Date   WBC 2.4 (L) 09/10/2015   HGB 12.1 09/10/2015   HCT 35.6 (L) 09/10/2015   MCV 90.4 09/10/2015   PLT 175 09/10/2015   NEUTROABS 1.3 (L) 09/10/2015    ASSESSMENT & PLAN:  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  Pathology: Lumpectomy at [facility] on 01/29/2015: residual microscopic invasive ductal carcinoma spanning 3.2 cm T2 N0 M0 Pathological stage II a  Treatment summary: Neoadjuvant chemotherapy with Taxotere, carboplatin, Herceptin and Perjeta given once every 3 weeks from 09/19/2014 to 01/02/2015 .  Adjuvant radiation therapy completed June 2016; Herceptin every 3 weeks completed 12/17/2015 started tamoxifen 06/05/2015. Tamoxifen started 06/05/2015  Current treatment: Tamoxifen 20 mg daily  Breast Cancer Surveillance: 1. Breast exam 05/02/2017: Tenderness in the left breast upper outer quadrant extending from the nipple superiorly. No palpable lumps. 2. Mammogram Nov 2017: Normal, Post-op changes Density Cat B.   Tamoxifen toxicities: Denies any hot flashes or myalgias or any side effects from tamoxifen she does feel mildly fatigued (patient was offered Neratinib but she refused) Recurrent headaches: Brain MRI was reviewed in detail. I recommended a neurology follow-up immediately to discuss the MRI result. The  discussion is whether she needs to have a repeat MRI in 3 months or not. Patient will be seeing neurology for these headaches.  Survivorship: Patient is participating in vigorous physical activity with the help of a trainer. Patient is receiving acupuncture for her back and it appears to be helping her.  Return to clinic in 1 year for follow-up   I spent 25 minutes talking to the patient of which more than half was spent in counseling and coordination of care.  No orders of the defined types were placed in this encounter.  The patient has a good understanding of the overall plan. she agrees with it. she will call with any problems that may develop before the next visit here.   Rulon Eisenmenger, MD 05/02/17

## 2017-06-16 ENCOUNTER — Ambulatory Visit: Payer: Managed Care, Other (non HMO) | Admitting: Hematology and Oncology

## 2017-08-02 ENCOUNTER — Other Ambulatory Visit: Payer: Self-pay

## 2017-08-02 DIAGNOSIS — C50412 Malignant neoplasm of upper-outer quadrant of left female breast: Secondary | ICD-10-CM

## 2017-08-02 MED ORDER — TAMOXIFEN CITRATE 20 MG PO TABS: 20.0000 mg | ORAL_TABLET | Freq: Every day | ORAL | 3 refills | Status: DC

## 2018-03-26 ENCOUNTER — Telehealth: Payer: Self-pay | Admitting: Hematology and Oncology

## 2018-03-26 NOTE — Telephone Encounter (Signed)
Left voice mail w/ pt. Rescheduled appt from 6/27 to 7/1 at 1:45.  Mailed calendar to pt.

## 2018-05-03 ENCOUNTER — Ambulatory Visit: Payer: Managed Care, Other (non HMO) | Admitting: Hematology and Oncology

## 2018-05-07 ENCOUNTER — Inpatient Hospital Stay: Payer: PRIVATE HEALTH INSURANCE | Attending: Hematology and Oncology | Admitting: Hematology and Oncology

## 2018-05-07 DIAGNOSIS — C50412 Malignant neoplasm of upper-outer quadrant of left female breast: Secondary | ICD-10-CM | POA: Insufficient documentation

## 2018-05-07 DIAGNOSIS — Z170 Estrogen receptor positive status [ER+]: Secondary | ICD-10-CM

## 2018-05-07 DIAGNOSIS — Z9221 Personal history of antineoplastic chemotherapy: Secondary | ICD-10-CM

## 2018-05-07 DIAGNOSIS — G43909 Migraine, unspecified, not intractable, without status migrainosus: Secondary | ICD-10-CM | POA: Diagnosis not present

## 2018-05-07 DIAGNOSIS — Z79818 Long term (current) use of other agents affecting estrogen receptors and estrogen levels: Secondary | ICD-10-CM | POA: Diagnosis not present

## 2018-05-07 DIAGNOSIS — Z923 Personal history of irradiation: Secondary | ICD-10-CM | POA: Insufficient documentation

## 2018-05-07 MED ORDER — TAMOXIFEN CITRATE 20 MG PO TABS: 20.0000 mg | ORAL_TABLET | Freq: Every day | ORAL | 3 refills | Status: DC

## 2018-05-07 MED ORDER — TOPIRAMATE 100 MG PO TABS: 100.0000 mg | ORAL_TABLET | Freq: Two times a day (BID) | ORAL | Status: DC

## 2018-05-07 MED ORDER — SUMATRIPTAN SUCCINATE 50 MG PO TABS: 50.0000 mg | ORAL_TABLET | ORAL | 0 refills | Status: DC | PRN

## 2018-05-07 NOTE — Progress Notes (Signed)
Patient Care Team: Thurnell Lose, MD as PCP - General (Obstetrics and Gynecology) Thurnell Lose, MD (Obstetrics and Gynecology) Erroll Luna, MD as Consulting Physician (General Surgery) Nicholas Lose, MD as Consulting Physician (Hematology and Oncology) Thea Silversmith, MD as Consulting Physician (Radiation Oncology) Sylvan Cheese, NP as Nurse Practitioner (Hematology and Oncology) Cornelia Copa, DO as Referring Physician (Surgical Oncology)  DIAGNOSIS:  Encounter Diagnoses  Name Primary?  . Malignant neoplasm of upper-outer quadrant of left breast in female, estrogen receptor positive ([place])   . Malignant neoplasm of upper-outer quadrant of left female breast, unspecified estrogen receptor status ([place])     SUMMARY OF ONCOLOGIC HISTORY:   Breast cancer of upper-outer quadrant of left female breast ([place])   08/20/2014 Initial Biopsy    Left breast bx: Invasive ductal carcinoma grade 3, ER+ (100%), PR+ (89%) HER-2 positive ratio 3.5, Ki-67 25%; biopsy of satellite lesion fibroadenoma      08/26/2014 Breast MRI    Left breast: Upper-outer quadrant 1.4 cm from nipple 1.9 x 2.5 x 2.6 cm enhancing mass with the 8 mm nodule 1 cm posterior Right breast: 1.4 x 1.6 cm mass upper outer quadrant biopsy proven fibroadenoma      08/26/2014 Clinical Stage    Stage IIA: T2 N0      09/19/2014 -  Neo-Adjuvant Chemotherapy    Neoadjuvant chemotherapy with Taxotere, carboplatin, Herceptin and Perjeta x6 cycles followed by Herceptin maintenance to be complete November 2016      09/26/2014 Procedure    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 Otherwise negative at ATM, BARD1, BRCA1, BRCA2, BRIP1, CDH1, CHEK2, EPCAM, MLH1, MRE11A, MSH2, MSH6, MUTYH, NBN, NF1, PALB2, PMS2, PTEN, RAD50, RAD51C, RAD51D, SMARCA4, STK11, and TP53.      01/09/2015 Breast MRI    Interval marked response to chemotherapy with significant decrease in the size of the tumor, satellite  nodule is now vaguely visible, 2.6 cm mass is now 0.9 cm      01/29/2015 Definitive Surgery    Left breast lumpectomy/SLNB by Dr. Ernst Bowler at [facility]; 3.2 cm area of microscopic IDC cells, 2 sentinel lymph nodes negative (0/2). 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  Left breast boost/ 16 Gy at 2 Gy per fraction x 8 fractions      06/05/2015 -  Anti-estrogen oral therapy    Tamoxifen 20 mg daily. Planned duration of treatment 5-10 years.      11/03/2015 Survivorship    Survivorship care visit completed and copy of care plan provided to patient.       CHIEF COMPLIANT: Follow-up on tamoxifen therapy  INTERVAL HISTORY: Kim Armstrong is a 42 year old with above-mentioned history of left breast cancer treated with neoadjuvant chemotherapy followed by lumpectomy radiation is currently on tamoxifen.  She has been on it for the past 3 years.  She appears to be tolerating it fairly well.  She does not have any hot flashes or myalgias. She has been exercising and staying fairly active.  She has had migraines for which she is on Topamax which has really helped her.  REVIEW OF SYSTEMS:   Constitutional: Denies fevers, chills or abnormal weight loss Eyes: Denies blurriness of vision Ears, nose, mouth, throat, and face: Denies mucositis or  sore throat Respiratory: Denies cough, dyspnea or wheezes Cardiovascular: Denies palpitation, chest discomfort Gastrointestinal:  Denies nausea, heartburn or change in bowel habits Skin: Denies abnormal skin rashes Lymphatics: Denies new lymphadenopathy or easy bruising Neurological:Denies numbness, tingling or new weaknesses Behavioral/Psych: Mood is stable, no new changes  Extremities: No lower extremity edema Breast:  denies any pain or lumps or nodules in either  breasts All other systems were reviewed with the patient and are negative.  I have reviewed the past medical history, past surgical history, social history and family history with the patient and they are unchanged from previous note.  ALLERGIES:  has No Known Allergies.  MEDICATIONS:  Current Outpatient Medications  Medication Sig Dispense Refill  . acetaminophen (TYLENOL) 500 MG tablet Take 1,000 mg by mouth every 6 (six) hours as needed for mild pain or moderate pain.    . SUMAtriptan (IMITREX) 50 MG tablet Take 1 tablet (50 mg total) by mouth every 2 (two) hours as needed for migraine. May repeat in 2 hours if headache persists or recurs. 10 tablet 0  . tamoxifen (NOLVADEX) 20 MG tablet Take 1 tablet (20 mg total) by mouth daily. 90 tablet 3  . topiramate (TOPAMAX) 100 MG tablet Take 1 tablet (100 mg total) by mouth 2 (two) times daily.     No current facility-administered medications for this visit.     PHYSICAL EXAMINATION: ECOG PERFORMANCE STATUS: 1 - Symptomatic but completely ambulatory  Vitals:   05/07/18 1339  BP: 100/83  Pulse: 64  Resp: 18  Temp: 98.5 F (36.9 C)  SpO2: 100%   Filed Weights   05/07/18 1339  Weight: 150 lb 8 oz (68.3 kg)    GENERAL:alert, no distress and comfortable SKIN: skin color, texture, turgor are normal, no rashes or significant lesions EYES: normal, Conjunctiva are pink and non-injected, sclera clear OROPHARYNX:no exudate, no erythema and lips, buccal mucosa, and tongue normal  NECK: supple, thyroid normal size, non-tender, without nodularity LYMPH:  no palpable lymphadenopathy in the cervical, axillary or inguinal LUNGS: clear to auscultation and percussion with normal breathing effort HEART: regular rate & rhythm and no murmurs and no lower extremity edema ABDOMEN:abdomen soft, non-tender and normal bowel sounds MUSCULOSKELETAL:no cyanosis of digits and no clubbing  NEURO: alert & oriented x 3 with fluent speech, no focal  motor/sensory deficits EXTREMITIES: No lower extremity edema BREAST: No palpable masses or nodules in either right or left breasts. No palpable axillary supraclavicular or infraclavicular adenopathy no breast tenderness or nipple discharge. (exam performed in the presence of a chaperone)  LABORATORY DATA:  I have reviewed the data as listed CMP Latest Ref Rng & Units 08/28/2015 08/07/2015 06/05/2015  Glucose 70 - 140 mg/dl 94 94 98  BUN 7.0 - 26.0 mg/dL 10.0 11.1 12.3  Creatinine 0.6 - 1.1 mg/dL 0.8 0.9 0.8  Sodium 136 - 145 mEq/L 140 141 140  Potassium 3.5 - 5.1 mEq/L 4.1 4.2 4.2  CO2 22 - 29 mEq/L '23 24 24  ' Calcium 8.4 - 10.4 mg/dL 9.3 9.1 8.9  Total Protein 6.4 - 8.3 g/dL 6.7 6.5 6.6  Total Bilirubin 0.20 - 1.20 mg/dL <0.30 0.37 <0.20  Alkaline Phos 40 - 150 U/L 43 41 54  AST 5 - 34 U/L '12 12 15  ' ALT 0 - 55 U/L <9 <9 11    Lab Results  Component Value Date   WBC 2.4 (L) 09/10/2015   HGB 12.1 09/10/2015   HCT 35.6 (L) 09/10/2015   MCV 90.4 09/10/2015  PLT 175 09/10/2015   NEUTROABS 1.3 (L) 09/10/2015    ASSESSMENT & PLAN:  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  Pathology: Lumpectomy at [facility] on 01/29/2015: residual microscopic invasive ductal carcinoma spanning 3.2 cm T2 N0 M0 Pathological stage II a  Treatment summary: Neoadjuvant chemotherapy with Taxotere, carboplatin, Herceptin and Perjeta given once every 3 weeks from 09/19/2014 to 01/02/2015 .Adjuvant radiation therapy completed June 2016;Herceptin every 3 weeks completed 02/09/2017started tamoxifen 06/05/2015. Tamoxifen started 06/05/2015  Current treatment: Tamoxifen 20 mg daily  Breast Cancer Surveillance: 1. Breast exam  05/07/2018: Tenderness in the left breast upper outer quadrant extending from the nipple  superiorly. No palpable lumps. 2. Mammogram Nov 2017: Normal, Post-op changes Density Cat B.   Tamoxifen toxicities: Denies any hot flashes or myalgias or any side effects from tamoxifen she does feel mildly fatigued (patient was offered Neratinib but she refused) Migraines: Currently on Topamax which has helped her significantly.  Return to clinic in 1 year for follow-up    No orders of the defined types were placed in this encounter.  The patient has a good understanding of the overall plan. she agrees with it. she will call with any problems that may develop before the next visit here.   Harriette Ohara, MD 05/07/18

## 2018-05-07 NOTE — Assessment & Plan Note (Signed)
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  Pathology: Lumpectomy at [facility] on 01/29/2015: residual microscopic invasive ductal carcinoma spanning 3.2 cm T2 N0 M0 Pathological stage II a  Treatment summary: Neoadjuvant chemotherapy with Taxotere, carboplatin, Herceptin and Perjeta given once every 3 weeks from 09/19/2014 to 01/02/2015 .Adjuvant radiation therapy completed June 2016;Herceptin every 3 weeks completed 02/09/2017started tamoxifen 06/05/2015. Tamoxifen started 06/05/2015  Current treatment: Tamoxifen 20 mg daily  Breast Cancer Surveillance: 1. Breast exam  05/07/2018: Tenderness in the left breast upper outer quadrant extending from the nipple superiorly. No palpable lumps. 2. Mammogram Nov 2017: Normal, Post-op changes Density Cat B.   Tamoxifen toxicities: Denies any hot flashes or myalgias or any side effects from tamoxifen she does feel mildly fatigued (patient was offered Neratinib but she refused)  Return to clinic in 1 year for follow-up

## 2018-05-08 ENCOUNTER — Telehealth: Payer: Self-pay | Admitting: Hematology and Oncology

## 2018-05-08 NOTE — Telephone Encounter (Signed)
Mailed patient calendar of upcoming July 2020 appointments.

## 2018-05-11 ENCOUNTER — Encounter: Payer: Self-pay | Admitting: Hematology and Oncology

## 2019-05-08 ENCOUNTER — Telehealth: Payer: Self-pay | Admitting: Hematology and Oncology

## 2019-05-08 NOTE — Assessment & Plan Note (Signed)
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  Pathology: Lumpectomy at [facility] on 01/29/2015: residual microscopic invasive ductal carcinoma spanning 3.2 cm T2 N0 M0 Pathological stage II a  Treatment summary: Neoadjuvant chemotherapy with Taxotere, carboplatin, Herceptin and Perjeta given once every 3 weeks from 09/19/2014 to 01/02/2015 .Adjuvant radiation therapy completed June 2016;Herceptin every 3 weeks completed 02/09/2017started tamoxifen 06/05/2015. Tamoxifenstarted 06/05/2015  Current treatment: Tamoxifen 20 mg daily  Breast Cancer Surveillance: 1. Breast exam 05/07/2018: Tenderness in the left breast upper outer quadrant extending from the nipple superiorly. No palpable lumps. 2. Mammogram 05/11/2018: Benign, post-op changes Density Cat B.   Tamoxifen toxicities: Denies any hot flashes or myalgias or any side effects from tamoxifen she does feel mildly fatigued(patient was offered Neratinib but she refused) Migraines: Currently on Topamax which has helped her significantly.  Return to clinic in 1 year for follow-up

## 2019-05-08 NOTE — Telephone Encounter (Signed)
I left a message regarding video visit  °

## 2019-05-13 ENCOUNTER — Telehealth: Payer: Self-pay | Admitting: Hematology and Oncology

## 2019-05-13 NOTE — Progress Notes (Signed)
HEMATOLOGY-ONCOLOGY MYCHART VIDEO VISIT PROGRESS NOTE  I connected with [name] on 05/14/2019 at  1:45 PM EDT by MyChart video conference and verified that I am speaking with the correct person using two identifiers.  I discussed the limitations, risks, security and privacy concerns of performing an evaluation and management service by MyChart and the availability of in person appointments.  I also discussed with the patient that there may be a patient responsible charge related to this service. The patient expressed understanding and agreed to proceed.  Patient's Location: Home Physician Location: Clinic  CHIEF COMPLIANT: Follow-up on tamoxifen therapy  INTERVAL HISTORY: Kim Armstrong is a 43 y.o. female with above-mentioned history of left breast cancer treated with neoadjuvant chemotherapy, lumpectomy, radiation, and who is currently on tamoxifen. I last saw her a year ago. Mammogram on 05/11/18 showed no evidence of malignancy bilaterally. She presents over MyChart today for annual follow-up.   Oncology History  Breast cancer of upper-outer quadrant of left female breast ([place])  08/20/2014 Initial Biopsy   Left breast bx: Invasive ductal carcinoma grade 3, ER+ (100%), PR+ (89%) HER-2 positive ratio 3.5, Ki-67 25%; biopsy of satellite lesion fibroadenoma   08/26/2014 Breast MRI   Left breast: Upper-outer quadrant 1.4 cm from nipple 1.9 x 2.5 x 2.6 cm enhancing mass with the 8 mm nodule 1 cm posterior Right breast: 1.4 x 1.6 cm mass upper outer quadrant biopsy proven fibroadenoma   08/26/2014 Clinical Stage   Stage IIA: T2 N0   09/19/2014 -  Neo-Adjuvant Chemotherapy   Neoadjuvant chemotherapy with Taxotere, carboplatin, Herceptin and Perjeta x6 cycles followed by Herceptin maintenance to be complete November 2016   09/26/2014 Procedure   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 Otherwise negative at ATM, BARD1, BRCA1, BRCA2, BRIP1, CDH1, CHEK2, EPCAM, MLH1,  MRE11A, MSH2, MSH6, MUTYH, NBN, NF1, PALB2, PMS2, PTEN, RAD50, RAD51C, RAD51D, SMARCA4, STK11, and TP53.   01/09/2015 Breast MRI   Interval marked response to chemotherapy with significant decrease in the size of the tumor, satellite nodule is now vaguely visible, 2.6 cm mass is now 0.9 cm   01/29/2015 Definitive Surgery   Left breast lumpectomy/SLNB by Dr. Ernst Bowler at [facility]; 3.2 cm area of microscopic IDC cells, 2 sentinel lymph nodes negative (0/2). 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  Left breast boost/ 16 Gy at 2 Gy per fraction x 8 fractions   06/05/2015 -  Anti-estrogen oral therapy   Tamoxifen 20 mg daily. Planned duration of treatment 5-10 years.   11/03/2015 Survivorship   Survivorship care visit completed and copy of care plan provided to patient.     REVIEW OF SYSTEMS:   Constitutional: Denies fevers, chills or abnormal weight loss Eyes: Denies blurriness of vision Ears, nose, mouth, throat, and face: Denies mucositis or sore throat Respiratory: Denies cough, dyspnea or wheezes Cardiovascular: Denies palpitation, chest discomfort Gastrointestinal:  Denies nausea, heartburn or change in bowel habits Skin: Denies abnormal skin rashes Lymphatics: Denies new lymphadenopathy or easy bruising Neurological:Denies numbness, tingling or new weaknesses Behavioral/Psych: Mood is stable, no new changes  Extremities: No lower extremity edema Breast: denies any pain or lumps or nodules in either breasts All other systems were reviewed with the patient and are negative.  Observations/Objective:  There were no vitals filed for this  visit. There is no height or weight on file to calculate BMI.  I have reviewed the data as listed CMP Latest Ref Rng & Units 08/28/2015 08/07/2015 06/05/2015  Glucose 70 - 140 mg/dl  94 94 98  BUN 7.0 - 26.0 mg/dL 10.0 11.1 12.3  Creatinine 0.6 - 1.1 mg/dL 0.8 0.9 0.8  Sodium 136 - 145 mEq/L 140 141 140  Potassium 3.5 - 5.1 mEq/L 4.1 4.2 4.2  CO2 22 - 29 mEq/L '23 24 24  ' Calcium 8.4 - 10.4 mg/dL 9.3 9.1 8.9  Total Protein 6.4 - 8.3 g/dL 6.7 6.5 6.6  Total Bilirubin 0.20 - 1.20 mg/dL <0.30 0.37 <0.20  Alkaline Phos 40 - 150 U/L 43 41 54  AST 5 - 34 U/L '12 12 15  ' ALT 0 - 55 U/L <9 <9 11    Lab Results  Component Value Date   WBC 2.4 (L) 09/10/2015   HGB 12.1 09/10/2015   HCT 35.6 (L) 09/10/2015   MCV 90.4 09/10/2015   PLT 175 09/10/2015   NEUTROABS 1.3 (L) 09/10/2015    Assessment Plan:  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  Pathology: [facility]omy at [facility] on 01/29/2015: residual microscopic invasive ductal carcinoma spanning 3.2 cm T2 N0 M0 Pathological stage II a  Treatment summary: Neoadjuvant chemotherapy with Taxotere, carboplatin, Herceptin and Perjeta given once every 3 weeks from 09/19/2014 to 01/02/2015 .Adjuvant radiation therapy completed June 2016;Herceptin every 3 weeks completed 02/09/2017started tamoxifen 06/05/2015. Tamoxifenstarted 06/05/2015  Current treatment: Tamoxifen 20 mg daily  Breast Cancer Surveillance: 1. Breast exam 05/07/2018: Tenderness in the left breast upper outer quadrant extending from the nipple superiorly. No palpable lumps. 2. Mammogram 05/11/2018: Benign, post-op changes Density Cat B.   Tamoxifen toxicities: Denies any hot flashes or myalgias or any side effects from tamoxifen she does feel mildly fatigued(patient was offered Neratinib but she refused) Migraines: Currently on Topamax which has helped her significantly. Memory problems: Patient is interested in REMEMBER clinical trial.  Return to clinic in 1 year  for follow-up  I discussed the assessment and treatment plan with the patient. The patient was provided an opportunity to ask questions and all were answered. The patient agreed with the plan and demonstrated an understanding of the instructions. The patient was advised to call back or seek an in-person evaluation if the symptoms worsen or if the condition fails to improve as anticipated.   I provided 15 minutes of face-to-face MyChart video visit time during this encounter.    Rulon Eisenmenger, MD 05/14/2019   I, Molly Dorshimer, am acting as scribe for Nicholas Lose, MD.  I have reviewed the above documentation for accuracy and completeness, and I agree with the above.

## 2019-05-14 ENCOUNTER — Inpatient Hospital Stay: Payer: PRIVATE HEALTH INSURANCE | Attending: Hematology and Oncology | Admitting: Hematology and Oncology

## 2019-05-14 DIAGNOSIS — Z79810 Long term (current) use of selective estrogen receptor modulators (SERMs): Secondary | ICD-10-CM | POA: Diagnosis not present

## 2019-05-14 DIAGNOSIS — C50412 Malignant neoplasm of upper-outer quadrant of left female breast: Secondary | ICD-10-CM

## 2019-05-14 DIAGNOSIS — Z170 Estrogen receptor positive status [ER+]: Secondary | ICD-10-CM | POA: Diagnosis not present

## 2019-05-14 DIAGNOSIS — Z923 Personal history of irradiation: Secondary | ICD-10-CM

## 2019-05-14 MED ORDER — TAMOXIFEN CITRATE 20 MG PO TABS: 20.0000 mg | ORAL_TABLET | Freq: Every day | ORAL | 3 refills | Status: DC

## 2019-05-17 ENCOUNTER — Telehealth: Payer: Self-pay | Admitting: *Deleted

## 2019-05-17 NOTE — Telephone Encounter (Signed)
05/17/2019 at 11:43am - REMEMBER study/WF [id number]- Received referral from Dr. Lindi Adie that pt is interested in the REMEMBER study on 05/15/2019.  Research nurse reviewed the pt's chart for eligibility purposes, and the pt was deemed eligible for study entry.  The research nurse called the pt today to discuss the study and the pre-screening eligibility tests with the pt.  The pt was unavailable by phone.  The nurse left a message asking the pt to return the nurse's call to discuss the trial.  The pt was thanked for her interest in the trial. Brion Aliment RN, BSN, CCRP Clinical Research Nurse 05/17/2019 11:48 AM

## 2019-05-31 ENCOUNTER — Encounter: Payer: Self-pay | Admitting: Hematology and Oncology

## 2019-08-21 ENCOUNTER — Encounter: Payer: Self-pay | Admitting: *Deleted

## 2019-08-21 NOTE — Progress Notes (Signed)
08/21/2019 at 2:22pm - The research nurse called the pt to see if she wanted to participate in the REMEMBER study b/c the study will meet accrual soon.  The pt said that she had read the consent at home, and she was interested in the study.  Therefore, the pt agreed to complete her pre-screening questions over the phone.  Adelfa Koh, [employer] research base administrator, informed the site that the pre-screening questions could be administered over the phone during this pandemic.  The pt remembered 8 words on the HVLT questionnaire, therefore, the pt did not meet eligibility criteria to continue with enrollment.  The pt was thanked for her interest in the trial.   Brion Aliment RN, BSN, CCRP Clinical Research Nurse 08/21/2019 2:27 PM

## 2019-08-21 NOTE — Progress Notes (Signed)
08/21/19 at 2:35pm - DCP-001 study - The pt did not meet criteria for the REMEMBER study.  The pt was introduced to the DCP study over the phone.  The pt said that she would probably enroll in this study.  The research nurse will mail the pt the DCP consent and hipaa form along with the DCP worksheet for the pt's review.  The pt was encouraged to read over the forms and to call the research nurse if she has any questions.  The research nurse will call the pt to discuss her DCP-001 participation on 09/04/19.  The pt was thanked for her time and consideration of this trial.  The pt must enroll in this trial by 09/18/19.   Brion Aliment RN, BSN, CCRP Clinical Research Nurse 08/21/2019 2:38 PM

## 2019-09-04 ENCOUNTER — Encounter: Payer: Self-pay | Admitting: *Deleted

## 2019-09-04 NOTE — Progress Notes (Signed)
09/04/19 at 10:26am - In order to be eligible for the DCP study, the pt must meet the "indication for the study intervention".  The pt's pre-screening memory tests did not reveal a condition for the study intervention.  Therefore, the pt did not meet the minimum eligibility criteria for this study.  The research nurse called and left the pt a message informing her that she does not meet the enrollment criteria.  The pt was given the research nurse's number to call if she has any questions. Brion Aliment RN, BSN, CCRP Clinical Research Nurse 09/04/2019 10:30 AM

## 2020-01-24 ENCOUNTER — Ambulatory Visit: Payer: PRIVATE HEALTH INSURANCE | Attending: Internal Medicine

## 2020-01-24 DIAGNOSIS — Z23 Encounter for immunization: Secondary | ICD-10-CM

## 2020-01-24 NOTE — Progress Notes (Signed)
   Covid-19 Vaccination Clinic  Name:  Caitilin Haven    MRN: [id number] DOB: 1976-07-05  01/24/2020  Ms. Aungst was observed post Covid-19 immunization for 15 minutes without incident. She was provided with Vaccine Information Sheet and instruction to access the V-Safe system.   Ms. Desautels was instructed to call 911 with any severe reactions post vaccine: Marland Kitchen Difficulty breathing  . Swelling of face and throat  . A fast heartbeat  . A bad rash all over body  . Dizziness and weakness   Immunizations Administered    Name Date Dose VIS Date Route   Pfizer COVID-19 Vaccine 01/24/2020  8:52 AM 0.3 mL 10/18/2019 Intramuscular   Manufacturer: [place]   Lot: [id number]   [place]: [id number]

## 2020-02-18 ENCOUNTER — Ambulatory Visit: Payer: PRIVATE HEALTH INSURANCE | Attending: Internal Medicine

## 2020-02-18 DIAGNOSIS — Z23 Encounter for immunization: Secondary | ICD-10-CM

## 2020-02-18 NOTE — Progress Notes (Signed)
   Covid-19 Vaccination Clinic  Name:  Sundra Morgenthaler    MRN: [id number] DOB: 10/01/1976  02/18/2020  Ms. Vallance was observed post Covid-19 immunization for 15 minutes without incident. She was provided with Vaccine Information Sheet and instruction to access the V-Safe system.   Ms. Finneran was instructed to call 911 with any severe reactions post vaccine: Marland Kitchen Difficulty breathing  . Swelling of face and throat  . A fast heartbeat  . A bad rash all over body  . Dizziness and weakness   Immunizations Administered    Name Date Dose VIS Date Route   Pfizer COVID-19 Vaccine 02/18/2020  9:41 AM 0.3 mL 10/18/2019 Intramuscular   Manufacturer: [place]   Lot: [id number]   [place]: [id number]

## 2020-06-17 ENCOUNTER — Ambulatory Visit: Payer: PRIVATE HEALTH INSURANCE | Admitting: Internal Medicine

## 2020-07-21 ENCOUNTER — Telehealth: Payer: Self-pay | Admitting: Hematology and Oncology

## 2020-07-21 NOTE — Telephone Encounter (Signed)
Scheduled appt per 9/13 sch msg - pt is aware of aptp date and time

## 2020-07-31 ENCOUNTER — Encounter: Payer: Self-pay | Admitting: Gastroenterology

## 2020-07-31 ENCOUNTER — Other Ambulatory Visit: Payer: Self-pay

## 2020-07-31 ENCOUNTER — Encounter: Payer: Self-pay | Admitting: Internal Medicine

## 2020-07-31 ENCOUNTER — Ambulatory Visit: Payer: 59 | Admitting: Internal Medicine

## 2020-07-31 VITALS — BP 102/64 | HR 80 | Temp 98.2°F | Ht 68.0 in | Wt 168.6 lb

## 2020-07-31 DIAGNOSIS — Z1211 Encounter for screening for malignant neoplasm of colon: Secondary | ICD-10-CM | POA: Diagnosis not present

## 2020-07-31 DIAGNOSIS — Z23 Encounter for immunization: Secondary | ICD-10-CM | POA: Diagnosis not present

## 2020-07-31 DIAGNOSIS — Z170 Estrogen receptor positive status [ER+]: Secondary | ICD-10-CM | POA: Diagnosis not present

## 2020-07-31 DIAGNOSIS — C50412 Malignant neoplasm of upper-outer quadrant of left female breast: Secondary | ICD-10-CM

## 2020-07-31 DIAGNOSIS — G43909 Migraine, unspecified, not intractable, without status migrainosus: Secondary | ICD-10-CM | POA: Diagnosis not present

## 2020-07-31 MED ORDER — ELETRIPTAN HYDROBROMIDE 20 MG PO TABS: 20.0000 mg | ORAL_TABLET | ORAL | 0 refills | Status: DC | PRN

## 2020-07-31 NOTE — Addendum Note (Signed)
Addended by: Westley Hummer B on: 07/31/2020 05:06 PM   Modules accepted: Orders

## 2020-07-31 NOTE — Patient Instructions (Signed)
-  Nice seeing you today!!  -Flu and tetanus vaccines today.  -We will arrange for referrals at the Headache clinic and with GI for your colonoscopy.  -Schedule follow up in around 6 months for your physical. Please come in fasting that day.

## 2020-07-31 NOTE — Progress Notes (Signed)
New Patient Office Visit     This visit occurred during the SARS-CoV-2 public health emergency.  Safety protocols were in place, including screening questions prior to the visit, additional usage of staff PPE, and extensive cleaning of exam room while observing appropriate contact time as indicated for disinfecting solutions.    CC/Reason for Visit: Establish care, discuss chronic conditions and some acute concerns Previous PCP: Unknown Last Visit: Unknown  HPI: Kim Armstrong is a 44 y.o. female who is coming in today for the above mentioned reasons. Past Medical History is significant for: Left breast cancer diagnosed in 2016 status post neoadjuvant chemotherapy, lumpectomy, radiation.  Oncology, Dr. Lindi Adie, had placed her on tamoxifen but she discontinued this about 6 months ago due to issues with brain fogginess, irritability.  She has not had this discussion with her oncologist yet.  She had a negative mammogram earlier this month.  She also has a history of chronic migraines.  At one point she had been on Topamax preventatively which seem to work well, however the effect wore off.  She has subscribed to an online migraine service and they are prescribing eletriptan and Zofran for her that she is taking on an as-needed basis.  She is having 2-3 migraine headaches a week.  Pain is 4-6 on the scale, they are accompanied by nausea.  She has not had a recent eye exam.  She is overdue for flu and Tdap, she turned 44 this year so she will be due for her 1st colonoscopy.  She has a GYN and had a Pap smear in January.  She is fully vaccinated against Covid.  She works as a Special educational needs teacher, she does not smoke, she does not drink, she has no known drug allergies, in addition to her lumpectomy she has also had surgery for an ectopic pregnancy, her family history is significant for a mother with breast cancer in her thirties and a father with type 2 diabetes.   Past Medical/Surgical  History: Past Medical History:  Diagnosis Date  . Breast cancer [facility])     Past Surgical History:  Procedure Laterality Date  . BREAST BIOPSY Left 10/14 and 21 of 2015  . SALPINGECTOMY  2010    Social History:  reports that she quit smoking about 25 years ago. Her smoking use included cigarettes. She has a 2.00 pack-year smoking history. She has never used smokeless tobacco. She reports current alcohol use. She reports that she does not use drugs.  Allergies: No Known Allergies  Family History:  Family History  Problem Relation Age of Onset  . Breast cancer Mother 47       unilateral  . Cancer Maternal Aunt 65       unknown type of cancer  . Cancer Maternal Grandmother 25       cancer - possibly pancreatic cancer or GI cancer     Current Outpatient Medications:  .  acetaminophen (TYLENOL) 500 MG tablet, Take 1,000 mg by mouth every 6 (six) hours as needed for mild pain or moderate pain., Disp: , Rfl:  .  eletriptan (RELPAX) 20 MG tablet, Take 1 tablet (20 mg total) by mouth as needed for migraine or headache. May repeat in 2 hours if headache persists or recurs., Disp: 10 tablet, Rfl: 0  Review of Systems:  Constitutional: Denies fever, chills, diaphoresis, appetite change and fatigue.  HEENT: Denies photophobia, eye pain, redness, hearing loss, ear pain, congestion, sore throat, rhinorrhea, sneezing, mouth sores, trouble swallowing, neck  pain, neck stiffness and tinnitus.   Respiratory: Denies SOB, DOE, cough, chest tightness,  and wheezing.   Cardiovascular: Denies chest pain, palpitations and leg swelling.  Gastrointestinal: Denies vomiting, abdominal pain, diarrhea, constipation, blood in stool and abdominal distention.  Genitourinary: Denies dysuria, urgency, frequency, hematuria, flank pain and difficulty urinating.  Endocrine: Denies: hot or cold intolerance, sweats, changes in hair or nails, polyuria, polydipsia. Musculoskeletal: Denies myalgias, back pain, joint  swelling, arthralgias and gait problem.  Skin: Denies pallor, rash and wound.  Neurological: Denies dizziness, seizures, syncope, weakness, light-headedness, numbness. Hematological: Denies adenopathy. Easy bruising, personal or family bleeding history  Psychiatric/Behavioral: Denies suicidal ideation, mood changes, confusion, nervousness, sleep disturbance and agitation    Physical Exam: Vitals:   07/31/20 1410  BP: 102/64  Pulse: 80  Temp: 98.2 F (36.8 C)  TempSrc: Oral  SpO2: 97%  Weight: 168 lb 9.6 oz (76.5 kg)  Height: 5\' 8"  (1.727 m)   Body mass index is 25.64 kg/m.  Constitutional: NAD, calm, comfortable Eyes: PERRL, lids and conjunctivae normal ENMT: Mucous membranes are moist. Neck: normal, supple, no masses, no thyromegaly Respiratory: clear to auscultation bilaterally, no wheezing, no crackles. Normal respiratory effort. No accessory muscle use.  Cardiovascular: Regular rate and rhythm, no murmurs / rubs / gallops. No extremity edema.  Neurologic: Grossly intact and nonfocal Psychiatric: Normal judgment and insight. Alert and oriented x 3. Normal mood.    Impression and Plan:  Migraine without status migrainosus, not intractable, unspecified migraine type -These continue to be a problem for her. -Topamax as a migraine preventative had worked really well for her initially but then stopped. -She was unhappy with her initial neurologist so she instead has subscribed to an online service who is prescribing Zofran and eletriptan to use as needed. -As she is having frequent migraine headaches, I believe that being on preventative therapy would be beneficial.  I have recommended referral to headache clinic, she agrees.  Malignant neoplasm of upper-outer quadrant of left breast in female, estrogen receptor positive ([place]) -Have advised her to discuss her decision to discontinue tamoxifen with her oncologist. We have discussed side effects of tamoxifen and also discussed  benefits in regards to recurrence of breast cancer. - She had a negative mammogram earlier this month.  Need for influenza vaccination -Flu vaccine administered today.  Need for Tdap vaccination -Tdap administered today.  Time Spent: 35 minutes   Patient Instructions  -Nice seeing you today!!  -Flu and tetanus vaccines today.  -We will arrange for referrals at the Headache clinic and with GI for your colonoscopy.  -Schedule follow up in around 6 months for your physical. Please come in fasting that day.     Lelon Frohlich, MD [facility]

## 2020-08-05 NOTE — Progress Notes (Signed)
Patient Care Team: Isaac Bliss, Rayford Halsted, MD as PCP - General (Internal Medicine) Thurnell Lose, MD (Obstetrics and Gynecology) Erroll Luna, MD as Consulting Physician (General Surgery) Nicholas Lose, MD as Consulting Physician (Hematology and Oncology) Thea Silversmith, MD as Consulting Physician (Radiation Oncology) Sylvan Cheese, NP as Nurse Practitioner (Hematology and Oncology) Cornelia Copa, DO as Referring Physician (Surgical Oncology)  DIAGNOSIS:    ICD-10-CM   1. Malignant neoplasm of upper-outer quadrant of left breast in female, estrogen receptor positive ([place])  C50.412    Z17.0     SUMMARY OF ONCOLOGIC HISTORY: Oncology History  Breast cancer of upper-outer quadrant of left female breast (Kim Armstrong)  08/20/2014 Initial Biopsy   Left breast bx: Invasive ductal carcinoma grade 3, ER+ (100%), PR+ (89%) HER-2 positive ratio 3.5, Ki-67 25%; biopsy of satellite lesion fibroadenoma   08/26/2014 Breast MRI   Left breast: Upper-outer quadrant 1.4 cm from nipple 1.9 x 2.5 x 2.6 cm enhancing mass with the 8 mm nodule 1 cm posterior Right breast: 1.4 x 1.6 cm mass upper outer quadrant biopsy proven fibroadenoma   08/26/2014 Clinical Stage   Stage IIA: T2 N0   09/19/2014 -  Neo-Adjuvant Chemotherapy   Neoadjuvant chemotherapy with Taxotere, carboplatin, Herceptin and Perjeta x6 cycles followed by Herceptin maintenance to be complete November 2016   09/26/2014 Procedure   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 Otherwise negative at ATM, BARD1, BRCA1, BRCA2, BRIP1, CDH1, CHEK2, EPCAM, MLH1, MRE11A, MSH2, MSH6, MUTYH, NBN, NF1, PALB2, PMS2, PTEN, RAD50, RAD51C, RAD51D, SMARCA4, STK11, and TP53.   01/09/2015 Breast MRI   Interval marked response to chemotherapy with significant decrease in the size of the tumor, satellite nodule is now vaguely visible, 2.6 cm mass is now 0.9 cm   01/29/2015 Definitive Surgery   Left breast lumpectomy/SLNB by  Dr. Ernst Bowler at [facility]; 3.2 cm area of microscopic IDC cells, 2 sentinel lymph nodes negative (0/2). 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  Left breast boost/ 16 Gy at 2 Gy per fraction x 8 fractions   06/05/2015 -  Anti-estrogen oral therapy   Tamoxifen 20 mg daily. Planned duration of treatment 5-10 years.   11/03/2015 Survivorship   Survivorship care visit completed and copy of care plan provided to patient.     CHIEF COMPLIANT: Follow-up of left breast cancer on tamoxifen therapy  INTERVAL HISTORY: Kim Armstrong is a 44 y.o. with above-mentioned history of left breast cancer treated with neoadjuvant chemotherapy, lumpectomy, radiation, and who is currently on tamoxifen. Mammogram on 07/10/20 showed no evidence of malignancy bilaterally. She presents to the clinic today for annual follow-up.  This past year she was experiencing frequent yeast infections as well as mental fogginess.  Because of this she stopped tamoxifen for a brief that and all of her symptoms have improved significantly.  She is very anxious about continuing this tamoxifen for 5 more years.  ALLERGIES:  has No Known Allergies.  MEDICATIONS:  Current Outpatient Medications  Medication Sig Dispense Refill  . acetaminophen (TYLENOL) 500 MG tablet Take 1,000 mg by mouth every 6 (six) hours as needed for mild pain or moderate pain.    Marland Kitchen eletriptan (RELPAX) 20 MG tablet Take 1 tablet (20 mg total) by mouth as needed for migraine or headache. May  repeat in 2 hours if headache persists or recurs. 10 tablet 0   No current facility-administered medications for this visit.    PHYSICAL EXAMINATION: ECOG PERFORMANCE STATUS: 1 - Symptomatic but completely ambulatory  Vitals:   08/06/20 1519  BP: 117/83  Pulse: 73  Resp: 20  Temp: (!) 97.4 F (36.3  C)  SpO2: 99%   Filed Weights   08/06/20 1519  Weight: 166 lb 14.4 oz (75.7 kg)    BREAST: No palpable masses or nodules in either right or left breasts. No palpable axillary supraclavicular or infraclavicular adenopathy no breast tenderness or nipple discharge. (exam performed in the presence of a chaperone)  LABORATORY DATA:  I have reviewed the data as listed CMP Latest Ref Rng & Units 08/28/2015 08/07/2015 06/05/2015  Glucose 70 - 140 mg/dl 94 94 98  BUN 7.0 - 26.0 mg/dL 10.0 11.1 12.3  Creatinine 0.6 - 1.1 mg/dL 0.8 0.9 0.8  Sodium 136 - 145 mEq/L 140 141 140  Potassium 3.5 - 5.1 mEq/L 4.1 4.2 4.2  CO2 22 - 29 mEq/L '23 24 24  ' Calcium 8.4 - 10.4 mg/dL 9.3 9.1 8.9  Total Protein 6.4 - 8.3 g/dL 6.7 6.5 6.6  Total Bilirubin 0.20 - 1.20 mg/dL <0.30 0.37 <0.20  Alkaline Phos 40 - 150 U/L 43 41 54  AST 5 - 34 U/L '12 12 15  ' ALT 0 - 55 U/L <9 <9 11    Lab Results  Component Value Date   WBC 2.4 (L) 09/10/2015   HGB 12.1 09/10/2015   HCT 35.6 (L) 09/10/2015   MCV 90.4 09/10/2015   PLT 175 09/10/2015   NEUTROABS 1.3 (L) 09/10/2015    ASSESSMENT & PLAN:  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  Pathology: Lumpectomy at [facility] on 01/29/2015: residual microscopic invasive ductal carcinoma spanning 3.2 cm T2 N0 M0 Pathological stage II a  Treatment summary: Neoadjuvant chemotherapy with Taxotere, carboplatin, Herceptin and Perjeta given once every 3 weeks from 09/19/2014 to 01/02/2015 .Adjuvant radiation therapy completed June 2016;Herceptin every 3 weeks completed 02/09/2017started tamoxifen 06/05/2015. Tamoxifenstarted 06/05/2015  Current treatment: Tamoxifen 20 mg daily  Breast Cancer Surveillance: 1. Breast exam9/30/2021: Tenderness in the left breast upper outer quadrant  extending from the nipple superiorly. No palpable lumps. 2. Mammogram  07/10/2020 at [facility]: Benign, post-op changes Density Cat B.   Tamoxifen toxicities:  1.  Frequent yeast infections 2. mental fogginess/cognitive dysfunction We decided to cut down the dosage of tamoxifen to 10 mg daily. She will continue this dosage for 3 months.  We will touch base with her with a MyChart virtual visit in 3 months.  If at that time her symptoms are good then she will continue on the 10 mg dosage for the next 5 years.  If however her symptoms relapse then we will send breast cancer index to determine if she would benefit from extended adjuvant therapy.   Return to clinic in 3 months for MyChart virtual visit to evaluate side effects of 10 mg of tamoxifen.   No orders of the defined types were placed in this encounter.  The patient has a good understanding of the overall plan. she agrees with it. she will call with any problems that may develop before the next visit here.  Total time spent: 30 mins including face to face  time and time spent for planning, charting and coordination of care  Nicholas Lose, MD 08/06/2020  I, Cloyde Reams Dorshimer, am acting as scribe for Dr. Nicholas Lose.  I have reviewed the above documentation for accuracy and completeness, and I agree with the above.

## 2020-08-06 ENCOUNTER — Inpatient Hospital Stay: Payer: 59 | Attending: Hematology and Oncology | Admitting: Hematology and Oncology

## 2020-08-06 ENCOUNTER — Other Ambulatory Visit: Payer: Self-pay

## 2020-08-06 DIAGNOSIS — Z79810 Long term (current) use of selective estrogen receptor modulators (SERMs): Secondary | ICD-10-CM | POA: Diagnosis not present

## 2020-08-06 DIAGNOSIS — Z79899 Other long term (current) drug therapy: Secondary | ICD-10-CM | POA: Diagnosis not present

## 2020-08-06 DIAGNOSIS — Z170 Estrogen receptor positive status [ER+]: Secondary | ICD-10-CM

## 2020-08-06 DIAGNOSIS — C50412 Malignant neoplasm of upper-outer quadrant of left female breast: Secondary | ICD-10-CM | POA: Insufficient documentation

## 2020-08-06 DIAGNOSIS — Z923 Personal history of irradiation: Secondary | ICD-10-CM | POA: Diagnosis not present

## 2020-08-06 MED ORDER — TAMOXIFEN CITRATE 20 MG PO TABS: 10.0000 mg | ORAL_TABLET | Freq: Every day | ORAL | 3 refills | Status: DC

## 2020-08-06 MED ORDER — ONDANSETRON 4 MG PO TBDP: 4.0000 mg | ORAL_TABLET | Freq: Three times a day (TID) | ORAL | 0 refills | Status: DC | PRN

## 2020-08-06 NOTE — Assessment & Plan Note (Addendum)
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  Pathology: Lumpectomy at [facility] on 01/29/2015: residual microscopic invasive ductal carcinoma spanning 3.2 cm T2 N0 M0 Pathological stage II a  Treatment summary: Neoadjuvant chemotherapy with Taxotere, carboplatin, Herceptin and Perjeta given once every 3 weeks from 09/19/2014 to 01/02/2015 .Adjuvant radiation therapy completed June 2016;Herceptin every 3 weeks completed 02/09/2017started tamoxifen 06/05/2015. Tamoxifenstarted 06/05/2015  Current treatment: Tamoxifen 20 mg daily  Breast Cancer Surveillance: 1. Breast exam9/30/2021: Tenderness in the left breast upper outer quadrant extending from the nipple superiorly. No palpable lumps. 2. Mammogram  07/10/2020 at [facility]: Benign, post-op changes Density Cat B.   Tamoxifen toxicities: Denies any hot flashes or myalgias or any side effects from tamoxifen she does feel mildly fatigued(patient was offered Neratinib but she refused) Migraines: Currently on Topamax which has helped her significantly. Memory problems: Did not meet criteria for REMEMBER clinical trial.  Return to clinic in 1 year for follow-up

## 2020-09-02 ENCOUNTER — Encounter: Payer: Self-pay | Admitting: Gastroenterology

## 2020-09-02 ENCOUNTER — Other Ambulatory Visit: Payer: Self-pay

## 2020-09-02 ENCOUNTER — Ambulatory Visit (AMBULATORY_SURGERY_CENTER): Payer: Self-pay | Admitting: *Deleted

## 2020-09-02 VITALS — Ht 68.0 in | Wt 168.0 lb

## 2020-09-02 DIAGNOSIS — Z1211 Encounter for screening for malignant neoplasm of colon: Secondary | ICD-10-CM

## 2020-09-02 MED ORDER — SUTAB 1479-225-188 MG PO TABS: 1.0000 | ORAL_TABLET | Freq: Once | ORAL | 0 refills | Status: AC

## 2020-09-02 NOTE — Progress Notes (Signed)
No egg or soy allergy known to patient  No issues with past sedation with any surgeries or procedures no intubation problems in the past  No FH of Malignant Hyperthermia No diet pills per patient No home 02 use per patient  No blood thinners per patient  Pt denies issues with constipation  No A fib or A flutter  EMMI video to pt or via [place] 19 guidelines implemented in PV today with Pt and RN   [employer] Coupon given to pt in PV today , Code to Pharmacy   Due to the COVID-19 pandemic we are asking patients to follow these guidelines. Please only bring one care partner. Please be aware that your care partner may wait in the car in the parking lot or if they feel like they will be too hot to wait in the car, they may wait in the lobby on the 4th floor. All care partners are required to wear a mask the entire time (we do not have any that we can provide them), they need to practice social distancing, and we will do a Covid check for all patient's and care partners when you arrive. Also we will check their temperature and your temperature. If the care partner waits in their car they need to stay in the parking lot the entire time and we will call them on their cell phone when the patient is ready for discharge so they can bring the car to the front of the building. Also all patient's will need to wear a mask into building.

## 2020-09-22 ENCOUNTER — Encounter: Payer: 59 | Admitting: Gastroenterology

## 2020-10-15 ENCOUNTER — Telehealth: Payer: Self-pay | Admitting: Gastroenterology

## 2020-10-15 ENCOUNTER — Encounter: Payer: 59 | Admitting: Gastroenterology

## 2020-10-15 NOTE — Telephone Encounter (Signed)
Hey Dr Silverio Decamp, this pt cancelled her colonoscopy appt that she had today @8 :30am due to the prep made her sick, she does not wish to reschedule at this time.

## 2020-10-15 NOTE — Telephone Encounter (Signed)
ok 

## 2020-11-08 NOTE — Progress Notes (Signed)
HEMATOLOGY-ONCOLOGY TELEPHONE VISIT PROGRESS NOTE  I connected with Kim Armstrong on 11/09/2020 at  9:00 AM EST by telephone call since she lost power and Internet and could not connect with [place].   verified that I am speaking with the correct person using two identifiers.  I discussed the limitations, risks, security and privacy concerns of performing an evaluation and management service  and the availability of in person appointments.   Patient's Location: Home Physician Location: Clinic  CHIEF COMPLIANT: Follow-up of left breast cancer on tamoxifen therapy  INTERVAL HISTORY: Kim Armstrong is a 45 y.o. female with above-mentioned history of left breast cancer treated with neoadjuvant chemotherapy, lumpectomy, radiation, and who is currently on 40m tamoxifen.She presentsto the clinic today for annual follow-up. She is tolerating the 10 mg dosage reasonably well.  She is working with a gynecologist to help alleviate the vaginal issues.  She thinks that the mental cloudiness is markedly better.  Oncology History  Breast cancer of upper-outer quadrant of left female breast [place]  08/20/2014 Initial Biopsy   Left breast bx: Invasive ductal carcinoma grade 3, ER+ (100%), PR+ (89%) HER-2 positive ratio 3.5, Ki-67 25%; biopsy of satellite lesion fibroadenoma   08/26/2014 Breast MRI   Left breast: Upper-outer quadrant 1.4 cm from nipple 1.9 x 2.5 x 2.6 cm enhancing mass with the 8 mm nodule 1 cm posterior Right breast: 1.4 x 1.6 cm mass upper outer quadrant biopsy proven fibroadenoma   08/26/2014 Clinical Stage   Stage IIA: T2 N0   09/19/2014 -  Neo-Adjuvant Chemotherapy   Neoadjuvant chemotherapy with Taxotere, carboplatin, Herceptin and Perjeta x6 cycles followed by Herceptin maintenance to be complete November 2016   09/26/2014 Procedure   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 Otherwise negative at ATM, BARD1, BRCA1, BRCA2, BRIP1, CDH1, CHEK2,  EPCAM, MLH1, MRE11A, MSH2, MSH6, MUTYH, NBN, NF1, PALB2, PMS2, PTEN, RAD50, RAD51C, RAD51D, SMARCA4, STK11, and TP53.   01/09/2015 Breast MRI   Interval marked response to chemotherapy with significant decrease in the size of the tumor, satellite nodule is now vaguely visible, 2.6 cm mass is now 0.9 cm   01/29/2015 Definitive Surgery   [facility].2 cm area of microscopic IDC cells, 2 sentinel lymph nodes negative (0/2). 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  Left breast boost/ 16 Gy at 2 Gy per fraction x 8 fractions   06/05/2015 -  Anti-estrogen oral therapy   Tamoxifen 20 mg daily. Planned duration of treatment 5-10 years.   11/03/2015 Survivorship   Survivorship care visit completed and copy of care plan provided to patient.     Observations/Objective:  There were no vitals filed for this visit. There is no height or weight on file to calculate BMI.  I have reviewed the data as listed CMP Latest Ref Rng & Units 08/28/2015 08/07/2015 06/05/2015  Glucose 70 - 140 mg/dl 94 94 98  BUN 7.0 - 26.0 mg/dL 10.0 11.1 12.3  Creatinine 0.6 - 1.1 mg/dL 0.8 0.9 0.8  Sodium 136 - 145 mEq/L 140 141 140  Potassium 3.5 - 5.1 mEq/L 4.1 4.2 4.2  CO2 22 - 29 mEq/L '23 24 24  ' Calcium 8.4 - 10.4 mg/dL 9.3 9.1 8.9  Total Protein 6.4 - 8.3 g/dL  6.7 6.5 6.6  Total Bilirubin 0.20 - 1.20 mg/dL <0.30 0.37 <0.20  Alkaline Phos 40 - 150 U/L 43 41 54  AST 5 - 34 U/L '12 12 15  ' ALT 0 - 55 U/L <9 <9 11    Lab Results  Component Value Date   WBC 2.4 (L) 09/10/2015   HGB 12.1 09/10/2015   HCT 35.6 (L) 09/10/2015   MCV 90.4 09/10/2015   PLT 175 09/10/2015   NEUTROABS 1.3 (L) 09/10/2015      Assessment Plan:  Breast cancer of upper-outer quadrant of left female breast Left breast  invasive ductal carcinoma: ER/PR positive HER-2 positive Ki-67 of 25 percent: T2, N0, M0 clinical stage II A; 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  Pathology: Lumpectomy at [facility] on 01/29/2015: residual microscopic invasive ductal carcinoma spanning 3.2 cm T2 N0 M0 Pathological stage II a  Treatment summary: Neoadjuvant chemotherapy with Taxotere, carboplatin, Herceptin and Perjeta given once every 3 weeks from 09/19/2014 to 01/02/2015 .Adjuvant radiation therapy completed June 2016;Herceptin every 3 weeks completed 02/09/2017started tamoxifen 06/05/2015. Tamoxifenstarted 06/05/2015  Current treatment: Tamoxifen 10 mg daily   Breast Cancer Surveillance: 1. Breast exam9/30/2021: Tenderness in the left breast upper outer quadrant extending from the nipple superiorly. No palpable lumps. 2. Mammogram 07/10/2020 at [facility]:Benign, post-op changes Density Cat B.   Tamoxifen toxicities:  Use infections and mental fogginess/cognitive dysfunction are markedly better at 10 mg dosage. Therefore we will continue with the same dosage without ordering breast cancer index at this time.  Return to clinic in  1 year for follow-up    I discussed the assessment and treatment plan with the patient. The patient was provided an opportunity to ask questions and all were answered. The patient agreed with the plan and demonstrated an understanding of the instructions. The patient was advised to call back or seek an in-person evaluation if the symptoms worsen or if the condition fails to improve as anticipated.   I provided 11 minutes of face-to-face MyChart video visit time during this encounter.    Rulon Eisenmenger, MD 11/09/2020   I, Molly Dorshimer, am acting as scribe for Nicholas Lose, MD.  I have reviewed the above documentation for accuracy and completeness, and I agree with the above.

## 2020-11-09 ENCOUNTER — Inpatient Hospital Stay: Payer: 59 | Attending: Hematology and Oncology | Admitting: Hematology and Oncology

## 2020-11-09 DIAGNOSIS — C50412 Malignant neoplasm of upper-outer quadrant of left female breast: Secondary | ICD-10-CM | POA: Diagnosis not present

## 2020-11-09 DIAGNOSIS — Z170 Estrogen receptor positive status [ER+]: Secondary | ICD-10-CM | POA: Diagnosis not present

## 2020-11-09 NOTE — Assessment & Plan Note (Signed)
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  Pathology: Lumpectomy at [facility] on 01/29/2015: residual microscopic invasive ductal carcinoma spanning 3.2 cm T2 N0 M0 Pathological stage II a  Treatment summary: Neoadjuvant chemotherapy with Taxotere, carboplatin, Herceptin and Perjeta given once every 3 weeks from 09/19/2014 to 01/02/2015 .Adjuvant radiation therapy completed June 2016;Herceptin every 3 weeks completed 02/09/2017started tamoxifen 06/05/2015. Tamoxifenstarted 06/05/2015  Current treatment: Tamoxifen 20 mg daily  Breast Cancer Surveillance: 1. Breast exam9/30/2021: Tenderness in the left breast upper outer quadrant extending from the nipple superiorly. No palpable lumps. 2. Mammogram 07/10/2020 at [facility]:Benign, post-op changes Density Cat B.   Tamoxifen toxicities:  1.  Frequent yeast infections 2. mental fogginess/cognitive dysfunction We decided to cut down the dosage of tamoxifen to 10 mg daily. She will continue this dosage for 3 months.  We will touch base with her with a MyChart virtual visit in 3 months.  If at that time her symptoms are good then she will continue on the 10 mg dosage for the next 5 years.  If however her symptoms relapse then we will send breast cancer index to determine if she would benefit from extended adjuvant therapy.   Return to clinic in 3 months for MyChart virtual visit to evaluate side effects of 10 mg of tamoxifen.

## 2021-03-20 ENCOUNTER — Telehealth: Payer: 59 | Admitting: Orthopedic Surgery

## 2021-03-20 DIAGNOSIS — U071 COVID-19: Secondary | ICD-10-CM

## 2021-03-20 MED ORDER — BENZONATATE 100 MG PO CAPS: 100.0000 mg | ORAL_CAPSULE | Freq: Three times a day (TID) | ORAL | 0 refills | Status: DC | PRN

## 2021-03-20 MED ORDER — NAPROXEN 500 MG PO TABS: 500.0000 mg | ORAL_TABLET | Freq: Two times a day (BID) | ORAL | 0 refills | Status: DC

## 2021-03-20 NOTE — Progress Notes (Signed)
E-Visit for Positive Covid Test Result We are sorry you are not feeling well. We are here to help!  You have tested positive for COVID-19, meaning that you were infected with the novel coronavirus and could give the virus to others.  It is vitally important that you stay home so you do not spread it to others.      Please continue isolation at home, for at least 10 days since the start of your symptoms and until you have had 24 hours with no fever (without taking a fever reducer) and with improving of symptoms.  If you have no symptoms but tested positive (or all symptoms resolve after 5 days and you have no fever) you can leave your house but continue to wear a mask around others for an additional 5 days. If you have a fever,continue to stay home until you have had 24 hours of no fever. Most cases improve 5-10 days from onset but we have seen a small number of patients who have gotten worse after the 10 days.  Please be sure to watch for worsening symptoms and remain taking the proper precautions.   Go to the nearest hospital ED for assessment if fever/cough/breathlessness are severe or illness seems like a threat to life.    The following symptoms may appear 2-14 days after exposure: . Fever . Cough . Shortness of breath or difficulty breathing . Chills . Repeated shaking with chills . Muscle pain . Headache . Sore throat . New loss of taste or smell . Fatigue . Congestion or runny nose . Nausea or vomiting . Diarrhea  You have been enrolled in [facility] for COVID-19. Daily you will receive a questionnaire within the [facility] website. Our COVID-19 response team will be monitoring your responses daily.  You can use medication such as prescription cough medication called Tessalon Perles 100 mg. You may take 1-2 capsules every 8 hours as needed for cough and prescription anti-inflammatory called Naprosyn 500 mg. Take twice daily as needed for fever or body aches for 2 weeks    In addition I will make a referral to our infusion clinic to see if you meet criteria for additional treatment.  You may also take acetaminophen (Tylenol) as needed for fever.  HOME CARE: . Only take medications as instructed by your medical team. . Drink plenty of fluids and get plenty of rest. . A steam or ultrasonic humidifier can help if you have congestion.   GET HELP RIGHT AWAY IF YOU HAVE EMERGENCY WARNING SIGNS.  Call 911 or proceed to your closest emergency facility if: . You develop worsening high fever. . Trouble breathing . Bluish lips or face . Persistent pain or pressure in the chest . New confusion . Inability to wake or stay awake . You cough up blood. . Your symptoms become more severe . Inability to hold down food or fluids  This list is not all possible symptoms. Contact your medical provider for any symptoms that are severe or concerning to you.    Your e-visit answers were reviewed by a board certified advanced clinical practitioner to complete your personal care plan.  Depending on the condition, your plan could have included both over the counter or prescription medications.  If there is a problem please reply once you have received a response from your provider.  Your safety is important to [place].  If you have drug allergies check your prescription carefully.    You can use MyChart to ask questions  about today's visit, request a non-urgent call back, or ask for a work or school excuse for 24 hours related to this e-Visit. If it has been greater than 24 hours you will need to follow up with your provider, or enter a new e-Visit to address those concerns. You will get an e-mail in the next two days asking about your experience.  I hope that your e-visit has been valuable and will speed your recovery. Thank you for using e-visits.    Greater than 5 minutes, yet less than 10 minutes of time have been spent researching, coordinating and implementing care for this  patient today.

## 2021-03-21 ENCOUNTER — Telehealth: Payer: Self-pay | Admitting: Nurse Practitioner

## 2021-03-21 NOTE — Telephone Encounter (Signed)
Called to discuss with patient about COVID-19 symptoms and the use of one of the available treatments for those with mild to moderate Covid symptoms and at a high risk of hospitalization.  Pt appears to qualify for outpatient treatment due to co-morbid conditions and/or a member of an at-risk group in accordance with the FDA Emergency Use Authorization.    Patient decline oral or IV therapy. States symptoms are getting better.   Kim Lea, NP [place] Treatment Team [phone]

## 2021-05-13 ENCOUNTER — Other Ambulatory Visit: Payer: Self-pay

## 2021-05-14 ENCOUNTER — Ambulatory Visit (INDEPENDENT_AMBULATORY_CARE_PROVIDER_SITE_OTHER): Payer: Managed Care, Other (non HMO) | Admitting: Internal Medicine

## 2021-05-14 ENCOUNTER — Encounter: Payer: Self-pay | Admitting: Internal Medicine

## 2021-05-14 VITALS — BP 102/68 | HR 71 | Temp 98.3°F | Wt 175.7 lb

## 2021-05-14 DIAGNOSIS — G8929 Other chronic pain: Secondary | ICD-10-CM | POA: Diagnosis not present

## 2021-05-14 DIAGNOSIS — M25511 Pain in right shoulder: Secondary | ICD-10-CM | POA: Diagnosis not present

## 2021-05-14 DIAGNOSIS — R32 Unspecified urinary incontinence: Secondary | ICD-10-CM

## 2021-05-14 LAB — POCT URINALYSIS DIPSTICK
Bilirubin, UA: NEGATIVE
Blood, UA: NEGATIVE
Glucose, UA: NEGATIVE
Ketones, UA: NEGATIVE
Leukocytes, UA: NEGATIVE
Nitrite, UA: NEGATIVE
Protein, UA: NEGATIVE
Spec Grav, UA: 1.015 (ref 1.010–1.025)
Urobilinogen, UA: 0.2 E.U./dL
pH, UA: 7 (ref 5.0–8.0)

## 2021-05-14 NOTE — Progress Notes (Signed)
Established Patient Office Visit     This visit occurred during the SARS-CoV-2 public health emergency.  Safety protocols were in place, including screening questions prior to the visit, additional usage of staff PPE, and extensive cleaning of exam room while observing appropriate contact time as indicated for disinfecting solutions.    CC/Reason for Visit: Discuss acute concerns  HPI: Kim Armstrong is a 45 y.o. female who is coming in today for the above mentioned reasons.  She is here today to discuss 2 issues:  1.  For quite some time she feels like she has been leaking urine.  Initially she thought it was a vaginal infection but was cleared by her gynecologist.  She mentions that she has noticed significant wetness in her underwear.  She believes it could be leaking of urine/incontinence.  2.  She has been having right shoulder pain now for few months.  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  No discrete injury that she can recall.  Pain is only when placing her shoulder behind her back to push herself off a chair or bed.  Pain is at the [facility] joint.   Past Medical/Surgical History: Past Medical History:  Diagnosis Date   Breast cancer [facility])    left    Past Surgical History:  Procedure Laterality Date   BREAST BIOPSY Left 10/14 and 21 of 2015   BREAST LUMPECTOMY Left 2016   SALPINGECTOMY  2010    Social History:  reports that she quit smoking about 26 years ago. Her smoking use included cigarettes. She has a 2.00 pack-year smoking history. She has never used smokeless tobacco. She reports current alcohol use. She reports that she does not use drugs.  Allergies: No Known Allergies  Family History:  Family History  Problem Relation Age of Onset   Breast cancer Mother 82       unilateral   Colon cancer Father 60   Cancer Maternal Aunt 1       unknown type of cancer   Cancer Maternal Grandmother 65       cancer -  possibly pancreatic cancer or GI cancer   Colon polyps Neg Hx    Esophageal cancer Neg Hx    Stomach cancer Neg Hx      Current Outpatient Medications:    acetaminophen (TYLENOL) 500 MG tablet, Take 1,000 mg by mouth every 6 (six) hours as needed for mild pain or moderate pain., Disp: , Rfl:    eletriptan (RELPAX) 20 MG tablet, Take 1 tablet (20 mg total) by mouth as needed for migraine or headache. May repeat in 2 hours if headache persists or recurs., Disp: 10 tablet, Rfl: 0   naproxen (NAPROSYN) 500 MG tablet, Take 1 tablet (500 mg total) by mouth 2 (two) times daily with a meal., Disp: 60 tablet, Rfl: 0   ondansetron (ZOFRAN ODT) 4 MG disintegrating tablet, Take 1 tablet (4 mg total) by mouth every 8 (eight) hours as needed for nausea or vomiting., Disp: 20 tablet, Rfl: 0   propranolol (INDERAL) 20 MG tablet, Take 20 mg by mouth 2 (two) times daily., Disp: , Rfl:    tamoxifen (NOLVADEX) 20 MG tablet, Take 0.5 tablets (10 mg total) by mouth daily., Disp: 90 tablet, Rfl: 3  Review of Systems:  Constitutional: Denies fever, chills, diaphoresis, appetite change and fatigue.  HEENT: Denies photophobia, eye pain, redness, hearing loss, ear pain, congestion, sore throat, rhinorrhea,  sneezing, mouth sores, trouble swallowing, neck pain, neck stiffness and tinnitus.   Respiratory: Denies SOB, DOE, cough, chest tightness,  and wheezing.   Cardiovascular: Denies chest pain, palpitations and leg swelling.  Gastrointestinal: Denies nausea, vomiting, abdominal pain, diarrhea, constipation, blood in stool and abdominal distention.  Genitourinary: Denies dysuria, urgency, frequency, hematuria, flank pain and difficulty urinating.  Endocrine: Denies: hot or cold intolerance, sweats, changes in hair or nails, polyuria, polydipsia. Musculoskeletal: Denies myalgias, back pain, joint swelling and gait problem.  Skin: Denies pallor, rash and wound.  Neurological: Denies dizziness, seizures, syncope, weakness,  light-headedness, numbness and headaches.  Hematological: Denies adenopathy. Easy bruising, personal or family bleeding history  Psychiatric/Behavioral: Denies suicidal ideation, mood changes, confusion, nervousness, sleep disturbance and agitation    Physical Exam: Vitals:   05/14/21 1400  BP: 102/68  Pulse: 71  Temp: 98.3 F (36.8 C)  TempSrc: Oral  SpO2: 98%  Weight: 175 lb 11.2 oz (79.7 kg)    Body mass index is 26.72 kg/m.   Constitutional: NAD, calm, comfortable Eyes: PERRL, lids and conjunctivae normal ENMT: Mucous membranes are moist.  Respiratory: clear to auscultation bilaterally, no wheezing, no crackles. Normal respiratory effort. No accessory muscle use.  Cardiovascular: Regular rate and rhythm, no murmurs / rubs / gallops. No extremity edema.  Musculoskeletal: Full range of motion of shoulder, slight pain at the right [facility] joint area when right arm is placed behind back. Psychiatric: Normal judgment and insight. Alert and oriented x 3. Normal mood.    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 -Have advised better posture, as needed NSAIDs, icing, can consider PT if needed.  Urinary incontinence, unspecified type  -In office urine dipstick is negative for infection. -Refer to pelvic floor physical therapy.     Lelon Frohlich, MD [facility]

## 2021-06-03 ENCOUNTER — Ambulatory Visit: Payer: Managed Care, Other (non HMO) | Attending: Internal Medicine | Admitting: Physical Therapy

## 2021-06-03 ENCOUNTER — Encounter: Payer: Self-pay | Admitting: Physical Therapy

## 2021-06-03 ENCOUNTER — Other Ambulatory Visit: Payer: Self-pay

## 2021-06-03 DIAGNOSIS — M6281 Muscle weakness (generalized): Secondary | ICD-10-CM | POA: Diagnosis present

## 2021-06-03 DIAGNOSIS — R2689 Other abnormalities of gait and mobility: Secondary | ICD-10-CM | POA: Diagnosis present

## 2021-06-03 DIAGNOSIS — R279 Unspecified lack of coordination: Secondary | ICD-10-CM | POA: Diagnosis present

## 2021-06-03 NOTE — Therapy (Signed)
[facility] [address], [place], [place], [address] Phone: [phone]   Fax:  [phone]  Physical Therapy Evaluation  Patient Details  Name: Kim Armstrong MRN: [id number] Date of Birth: [birth year] Referring Provider (PT): Isaac Bliss, Rayford Halsted, MD   Encounter Date: 06/03/2021   PT End of Session - 06/03/21 0846     Visit Number 1    Number of Visits 30    Authorization Type [employer]    Authorization - Visit Number 1    PT Start Time [id number]    PT Stop Time 0838    PT Time Calculation (min) 40 min    Activity Tolerance Patient tolerated treatment well    Behavior During Therapy [facility] for tasks assessed/performed             Past Medical History:  Diagnosis Date   Breast cancer [facility])    left    Past Surgical History:  Procedure Laterality Date   BREAST BIOPSY Left 10/14 and 21 of 2015   BREAST LUMPECTOMY Left 2016   SALPINGECTOMY  2010    There were no vitals filed for this visit.    Subjective Assessment - 06/03/21 0802     Subjective Pt reports ~1 year history of urinary incontinence and described at feeling like she is constantly leaking without correlation with activities.    How long can you sit comfortably? no limitations    How long can you stand comfortably? no limitations    How long can you walk comfortably? no limitations    Patient Stated Goals to have less leakage    Currently in Pain? No/denies                [facility] PT Assessment - 06/03/21 0001       Assessment   Medical Diagnosis R32 (ICD-10-CM) - Urinary incontinence, unspecified type    Referring Provider (PT) Isaac Bliss, Rayford Halsted, MD    Prior Therapy not for pelvic floor      Precautions   Precautions None      Restrictions   Weight Bearing Restrictions No      Balance Screen   Has the patient fallen in the past 6 months No    Has the patient had a decrease in activity level because of a  fear of falling?  No    Is the patient reluctant to leave their home because of a fear of falling?  No      Home Ecologist residence    Living Arrangements Spouse/significant other;Children      Prior Function   Level of Independence Independent      Cognition   Overall Cognitive Status Within Functional Limits for tasks assessed      Sensation   Light Touch Appears Intact      Coordination   Gross Motor Movements are Fluid and Coordinated Yes    Fine Motor Movements are Fluid and Coordinated Yes      Posture/Postural Control   Posture/Postural Control Postural limitations    Postural Limitations Rounded Shoulders;Posterior pelvic tilt      ROM / Strength   AROM / PROM / Strength AROM;Strength      AROM   Overall AROM  Deficits    AROM Assessment Site Lumbar;Thoracic    Lumbar Flexion limited by 25%    Lumbar Extension limited by 25%    Lumbar - Right Side Bend limited by 25%  Lumbar - Left Side Bend limited by 25%    Lumbar - Right Rotation WFL    Lumbar - Left Rotation [facility]    Thoracic Flexion limited by 25%    Thoracic Extension limited by 25%    Thoracic - Right Side Bend limited by 25%    Thoracic - Left Side Bend limited by 25%    Thoracic - Right Rotation [facility]    Thoracic - Left Rotation [facility]      Strength   Overall Strength Deficits    Strength Assessment Site Hip    Right/Left Hip Left;Right    Right Hip Flexion 4+/5    Right Hip Extension 4/5    Right Hip External Rotation  4/5    Right Hip Internal Rotation 4/5    Right Hip ABduction 4/5    Right Hip ADduction 4+/5    Left Hip Flexion 4+/5    Left Hip Extension 4/5    Left Hip External Rotation 4/5    Left Hip Internal Rotation 4/5    Left Hip ABduction 4/5    Left Hip ADduction 4+/5      Flexibility   Soft Tissue Assessment /Muscle Length yes   limited by 25% in bil hamstrings, adductors and ER/IR of hips     Palpation   Palpation comment Rt ASIS elevated  compared to Lt and mild pelvic anterior rotation; Lt posterior rotated                        Objective measurements completed on examination: See above findings.     Pelvic Floor Special Questions - 06/03/21 0001     Prior Pelvic/Prostate Exam Yes    Are you Pregnant or attempting pregnancy? No    Prior Pregnancies Yes    Number of Pregnancies 3    Number of Vaginal Deliveries 1    Any difficulty with labor and deliveries No    Episiotomy Performed No    Currently Sexually Active Yes    Is this Painful No    History of sexually transmitted disease No    Marinoff Scale no problems    Urinary Leakage Yes    How often constant    Pad use intermittently doesn't like to wear due to irritation    Activities that cause leaking Other   constantly during the day and mildly during the night   Urinary urgency Yes    Urinary frequency no    Fecal incontinence No    Fluid intake feels like she does drink enough    Caffeine beverages 1-2 cups of coffee in the AM    Falling out feeling (prolapse) No    External Perineal Exam noted redness at vulva area, no pain, itching, buring per pt    Skin Integrity Irritaion present at;Erthema    Skin Integrity Irritation Present at vulva    Prolapse None    Pelvic Floor Internal Exam patient identified and patient confirms consent for PT to perform inernal soft tissue work and muscle strength and integrity assessment.    Exam Type Vaginal    Sensation WFL    Palpation pt denied all pain or tenderness    Strength fair squeeze, definite lift    Strength # of reps 3    Strength # of seconds 2    Tone decreased                      PT Education -  06/03/21 0843     Education Details Pt educated on female anatomy, exam findings, breathing mechanics, bladder irritants, vaginal lubricants, and the urge drill.    Person(s) Educated Patient    Methods Explanation;Demonstration;Tactile cues;Verbal cues;Handout    Comprehension  Verbalized understanding;Returned demonstration              PT Short Term Goals - 06/03/21 0905       PT SHORT TERM GOAL #1   Title Pt to be I with HEP    Time 6    Period Weeks    Status New    Target Date 07/15/21      PT SHORT TERM GOAL #2   Title pt to demonstrate at least 4/5 globally at internal pelvic floor to decrease urinary leakage    Time 6    Period Weeks    Status New    Target Date 07/15/21      PT SHORT TERM GOAL #3   Title pt to demonstrate ability to self correct breathing mechanics, posture and proper contract/relax of pelvic floor and core with activity at least 50% of the time    Time 6    Status New    Target Date 07/15/21      PT SHORT TERM GOAL #4   Title pt to report no more than 2 episodes of urinary leakage per day    Baseline constant    Time 6    Period Weeks    Status New    Target Date 07/15/21               PT Long Term Goals - 06/03/21 0909       PT LONG TERM GOAL #1   Baseline pt to be I with advanced HEP    Time 4    Period Months    Status New    Target Date 10/04/21      PT LONG TERM GOAL #2   Title pt to demonstrate ability to self correct breathing mechanics, posture and proper contract/relax of pelvic floor and core with activity at least 75% of the time    Time 4    Period Months    Status New    Target Date 10/04/21      PT LONG TERM GOAL #3   Title pt to report no more than 1 instance of urinary leakage per day with activity    Time 4    Period Months    Status New    Target Date 10/04/21                    Plan - 06/03/21 0847     Clinical Impression Statement Pt is 45yo female presenting to clinic with referral for urinary incontinence occuring for abour one year per pt. Pt reports she feels she leakages constantly and does not notice it worse with any activities but does note she does have strong urgency intermittently and feels it can be disruptive to tasks she is working on at that  time. Pt denies all pain throughout exam. Pt found to have mild bil hip weakness, decreased core activation with mobility, decreased flexibility in spine, bil hamstrings, adductors. Pt consented to internal pelvic exam, found to have redness in vulva area, pt reports having dryness at baseline, noted 3/5 internal pelvic floor strength in anterior/posterior/Lt quadrants and 2/5 at Rt quadrants, decreased coordination of contract/relax of pelvic floor to fully relax when cued but also difficulty contracting  without holding breath without cues, and decreased endurance with contraction isometrics. Pt would benefit from PT to address these impairments.    Personal Factors and Comorbidities Time since onset of injury/illness/exacerbation;Fitness    Examination-Activity Limitations Continence    Examination-Participation Restrictions Community Activity;Shop    Stability/Clinical Decision Making Stable/Uncomplicated    Clinical Decision Making Low    Rehab Potential Good    PT Frequency 1x / week    PT Duration 12 weeks    PT Treatment/Interventions ADLs/Self Care Home Management;Functional mobility training;Patient/family education;Therapeutic activities;Therapeutic exercise;Neuromuscular re-education;Manual techniques;Energy conservation;Passive range of motion;Scar mobilization;Taping    PT Next Visit Plan core, hip, glute strengthening/stretching, go over HEP and urge drill    PT Home Exercise Plan [email or web address]    Consulted and Agree with Plan of Care Patient             Patient will benefit from skilled therapeutic intervention in order to improve the following deficits and impairments:  Decreased endurance, Decreased activity tolerance, Improper body mechanics, Impaired flexibility, Decreased mobility, Decreased strength, Postural dysfunction  Visit Diagnosis: Muscle weakness (generalized) - Plan: PT plan of care cert/re-cert  Unspecified lack of coordination - Plan: PT plan of care  cert/re-cert  Other abnormalities of gait and mobility - Plan: PT plan of care cert/re-cert     Problem List Patient Active Problem List   Diagnosis Date Noted   Migraines 07/31/2020   Genetic testing - OvaNext gene panel 10/09/2014   Breast cancer of upper-outer quadrant of left female breast ([place]) 08/22/2014    Stacy Gardner, PT 07/28/229:12 AM   [facility] [address] [place], [place], [address] Phone: [phone]   Fax:  [phone]  Name: Daleena Campuzano MRN: [id number] Date of Birth: 05/27/1976

## 2021-06-03 NOTE — Patient Instructions (Addendum)
Bladder Irritants  Certain foods and beverages can be irritating to the bladder.  Avoiding these irritants may decrease your symptoms of urinary urgency, frequency or bladder pain.  Even reducing your intake can help with your symptoms.  Not everyone is sensitive to all bladder irritants, so you may consider focusing on one irritant at a time, removing or reducing your intake of that irritant for 7-10 days to see if this change helps your symptoms.  Water intake is also very important.  Below is a list of bladder irritants.  Drinks: alcohol, carbonated beverages, caffeinated beverages such as coffee and tea, drinks with artificial sweeteners, citrus juices, apple juice, tomato juice  Foods: tomatoes and tomato based foods, spicy food, sugar and artificial sweeteners, vinegar, chocolate, raw onion, apples, citrus fruits, pineapple, cranberries, tomatoes, strawberries, plums, peaches, cantaloupe  Other: acidic urine (too concentrated) - see water intake info below  Substitutes you can try that are NOT irritating to the bladder: cooked onion, pears, papayas, sun-brewed decaf teas, watermelons, non-citrus herbal teas, apricots, kava and low-acid instant drinks (Postum).    WATER INTAKE: Remember to drink lots of water (aim for fluid intake of half your body weight with 2/3 of fluids being water).  You may be limiting fluids due to fear of leakage, but this can actually worsen urgency symptoms due to highly concentrated urine.  Water helps balance the pH of your urine so it doesn't become too acidic - acidic urine is a bladder irritant!    Lubrication Used for intercourse to reduce friction Avoid ones that have glycerin, warming gels, tingling gels, icing or cooling gel, scented Avoid parabens due to a preservative similar to female sex hormone May need to be reapplied once or several times during sexual activity Can be applied to both partners genitals prior to vaginal penetration to minimize  friction or irritation Prevent irritation and mucosal tears that cause post coital pain and increased the risk of vaginal and urinary tract infections Oil-based lubricants cannot be used with condoms due to breaking them down.  Least likely to irritate vaginal tissue.  Plant based-lubes are safe Silicone-based lubrication are thicker and last long and used for post-menopausal women  Vaginal Lubricators Here is a list of some suggested lubricators you can use for intercourse. Use the most hypoallergenic product.  You can place on you or your partner.  Slippery Stuff ( water based) Sylk or Sliquid Natural H2O ( good  if frequent UTI's)- walmart, amazon Sliquid organics silk-(aloe and silicone based ) [employer] (www.blossom-organics.com)- (aloe based ) Coconut oil, olive oil -not good with condoms  PJur Woman Nude- (water based) amazon Uberlube- ( silicon) [place] has an organic one Yes lubricant- (water based and has plant oil based similar to silicone) [employer] Platinum-Silicone, [employer], [facility] Olive and Bee intimate cream-  www.oliveandbee.com.au Pink - [employer] stuff Erosense Sync- walmart, amazon Coconu- FailLinks.co.uk  Things to avoid in lubricants are glycerin, warming gels, tingling gels, icing or cooling  gels, and scented gels.  Also avoid Vaseline. KY jelly, Replens, and Astroglide contain chlorhexidine which kills good bacteria(lactobacilli)  Things to avoid in the vaginal area Do not use things to irritate the vulvar area No lotions- see below Soaps you  can use :Aveeno, [place], Good Clean Love cleanser if needed. Must be gentle No deodorants No douches Good to sleep without underwear to let the vaginal area to air out No scrubbing: spread the lips to let warm water rinse over labias and pat  dry  Creams that can be used on the [employer], [employer] Vital V Wild [place] Salve Julva- [employer] Botanical Pro-Meno Wild Yam  Cream Coconut oil, olive oil Cleo by [employer] labial moisturizer -Amazon,  [place] Releveum ( lidocaine) or [place] Gele Yes Moisturizer    Urge Incontinence  Ideal urination frequency is every 2-4 wakeful hours, which equates to 5-8 times within a 24-hour period.   Urge incontinence is leakage that occurs when the bladder muscle contracts, creating a sudden need to go before getting to the bathroom.   Going too often when your bladder isn't actually full can disrupt the body's automatic signals to store and hold urine longer, which will increase urgency/frequency.  In this case, the bladder "is running the show" and strategies can be learned to retrain this pattern.   One should be able to control the first urge to urinate, at around 130m.  The bladder can hold up to a "grande latte," or 4084m To help you gain control, practice the Urge Drill below when urgency strikes.  This drill will help retrain your bladder signals and allow you to store and hold urine longer.  The overall goal is to stretch out your time between voids to reach a more manageable voiding schedule.    Practice your "quick flicks" often throughout the day (each waking hour) even when you don't need feel the urge to go.  This will help strengthen your pelvic floor muscles, making them more effective in controlling leakage.  Urge Drill  When you feel an urge to go, follow these steps to regain control: Stop what you are doing and be still Take one deep breath, directing your air into your abdomen Think an affirming thought, such as "I've got this." Do 5 quick flicks of your pelvic floor Walk with control to the bathroom to void, or delay voiding  THE KNACK  The Knack is a strategy you may use to help to reduce or prevent leakage or passing of urine, gas or feces during an activity that causes downward force on the pelvic floor muscles.    Activities that can cause downward pressure on the pelvic floor  muscles include coughing, sneezing, laughing, bending, lifting, and transitioning from different body positions such as from laying down to sitting up and sitting to standing.  To perform The Knack, consciously squeeze and lift your pelvic floor muscles to perform a strong, well-timed pelvic muscle contraction BEFORE AND DURING these activities above.  As your contraction gets more coordinated and your muscles get stronger, you will become more effective in controlling your experience of incontinence or gas passing during these activities.    Access Code: [email or web address] URL: [email or web address] Date: 06/03/2021 Prepared by: [facility]  Exercises Supine Diaphragmatic Breathing - 1 x daily - 7 x weekly - 1 sets - 10 reps Seated Hamstring Stretch - 1 x daily - 7 x weekly - 1 sets - 2-5 reps - 45s hold Supine Hip Adductor Stretch - 1 x daily - 7 x weekly - 1 sets - 2-5 reps - 45 hold Supine Pelvic Floor Stretch - 1 x daily - 7 x weekly - 1 sets - 2-5 reps - 45 hold Diaphragmatic Breathing in Child's Pose with Pelvic Floor Relaxation - 1 x daily - 7 x weekly - 1 sets - 2-5 reps - 45s hold Modified Thomas Stretch - 1 x daily - 7 x weekly - 1 sets - 2-5 reps - 45s hold  Hooklying Transversus Abdominis Palpation - 1 x daily - 7 x weekly - 1 sets - 10 reps - 2-3s holds   [facility] [address] [place], [place] [address] Phone # [phone] Fax [phone]

## 2021-06-15 ENCOUNTER — Other Ambulatory Visit: Payer: Self-pay

## 2021-06-15 ENCOUNTER — Ambulatory Visit: Payer: Managed Care, Other (non HMO) | Attending: Internal Medicine | Admitting: Physical Therapy

## 2021-06-15 DIAGNOSIS — R2689 Other abnormalities of gait and mobility: Secondary | ICD-10-CM | POA: Diagnosis present

## 2021-06-15 DIAGNOSIS — R279 Unspecified lack of coordination: Secondary | ICD-10-CM | POA: Insufficient documentation

## 2021-06-15 DIAGNOSIS — M6281 Muscle weakness (generalized): Secondary | ICD-10-CM | POA: Diagnosis present

## 2021-06-15 NOTE — Therapy (Signed)
[facility] [address] [place], [place], [address] Phone: [phone]   Fax:  [phone]  Physical Therapy Treatment  Patient Details  Name: Kim Armstrong MRN: [id number] Date of Birth: 08/08/1976 Referring Provider (PT): Isaac Bliss, Rayford Halsted, MD   Encounter Date: 06/15/2021   PT End of Session - 06/15/21 0925     Visit Number 2    Number of Visits 30    Authorization Type [employer]    Authorization - Visit Number 2    PT Start Time 0845    PT Stop Time 0926    PT Time Calculation (min) 41 min    Activity Tolerance Patient tolerated treatment well    Behavior During Therapy [facility] for tasks assessed/performed             Past Medical History:  Diagnosis Date   Breast cancer [facility])    left    Past Surgical History:  Procedure Laterality Date   BREAST BIOPSY Left 10/14 and 21 of 2015   BREAST LUMPECTOMY Left 2016   SALPINGECTOMY  2010    There were no vitals filed for this visit.   Subjective Assessment - 06/15/21 0848     Subjective Pt reports she does not feel she has seen much change in UI since last visit but is compliant with HEP.    How long can you sit comfortably? no limitations    How long can you stand comfortably? no limitations    How long can you walk comfortably? no limitations    Patient Stated Goals to have less leakage    Currently in Pain? No/denies                               [facility] Adult PT Treatment/Exercise - 06/15/21 0001       Self-Care   Self-Care Other Self-Care Comments    Other Self-Care Comments  Pt educated and given handout for bladder diary. Pt also educated on the urge drill handout and techniques.      Exercises   Exercises Knee/Hip;Lumbar      Lumbar Exercises: Stretches   Other Lumbar Stretch Exercise childs pose x30s      Lumbar Exercises: Standing   Functional Squats 10 reps    Functional Squats Limitations 10#  kettlebell    Other Standing Lumbar Exercises golfers carry with 10# kettlebell to return to standing x10 each side      Lumbar Exercises: Quadruped   [place]cat/Old Horse 10 reps    Opposite Arm/Leg Raise Left arm/Right leg;Right arm/Left leg;10 reps   with TA activation   Other Quadruped Lumbar Exercises hovers in quad with TA activation x10      Knee/Hip Exercises: Standing   Lateral Step Up Both;10 reps    Lateral Step Up Limitations 10# kettlebell, 6" step    Forward Step Up Both;10 reps    Forward Step Up Limitations 6" step 10# kettle bell    Other Standing Knee Exercises eccentric lowering on stairs x15 each                    PT Education - 06/15/21 0922     Education Details Pt educated on urge drill, techniques to improve voiding schedule and urge, bladder diary given, vaginal weights handout and discussion as well and moistuizer handout given.    Person(s) Educated Patient    Methods Explanation;Demonstration;Tactile cues;Verbal cues;Handout  Comprehension Verbalized understanding;Returned demonstration;Verbal cues required;Tactile cues required              PT Short Term Goals - 06/03/21 0905       PT SHORT TERM GOAL #1   Title Pt to be I with HEP    Time 6    Period Weeks    Status New    Target Date 07/15/21      PT SHORT TERM GOAL #2   Title pt to demonstrate at least 4/5 globally at internal pelvic floor to decrease urinary leakage    Time 6    Period Weeks    Status New    Target Date 07/15/21      PT SHORT TERM GOAL #3   Title pt to demonstrate ability to self correct breathing mechanics, posture and proper contract/relax of pelvic floor and core with activity at least 50% of the time    Time 6    Status New    Target Date 07/15/21      PT SHORT TERM GOAL #4   Title pt to report no more than 2 episodes of urinary leakage per day    Baseline constant    Time 6    Period Weeks    Status New    Target Date 07/15/21                PT Long Term Goals - 06/03/21 0909       PT LONG TERM GOAL #1   Baseline pt to be I with advanced HEP    Time 4    Period Months    Status New    Target Date 10/04/21      PT LONG TERM GOAL #2   Title pt to demonstrate ability to self correct breathing mechanics, posture and proper contract/relax of pelvic floor and core with activity at least 75% of the time    Time 4    Period Months    Status New    Target Date 10/04/21      PT LONG TERM GOAL #3   Title pt to report no more than 1 instance of urinary leakage per day with activity    Time 4    Period Months    Status New    Target Date 10/04/21                   Plan - 06/15/21 [id number]     Clinical Impression Statement Pt presents to clinic reporting she had not noticed much difference in urinary leakage since last visit and doesn't notice a coorelation with leakage and activities. Pt does report she has urgency intermittently and continues to be distruptive, pt uses vaginal moisturizer at baseline and reports she thinks this is helpfing dryness and given handout for this as well. PT gave pt urge drill and went over this in session to give pt techniques to improve urgency and to attempt to increase time between voids as needed to reach 2-4 hour window. Pt also given vaginal weight handout as she had questions about internal feedback to insure she is contracting and relaxing correctly at home, also educated on self assessment techniques. Pt requested to do exercises this session and assess if she had leakage. Pt lead through exercises for core and hip strength with cues for technique with all exercises for pelvic floor contract and relax and proper breathing technique. Pt would benefit from continued PT for improved symptoms with urinary incontinence and  improved hip/core strength and coordination.    Personal Factors and Comorbidities Time since onset of injury/illness/exacerbation;Fitness;Comorbidity 1    Comorbidities  h/o breast cancer    Examination-Activity Limitations Continence    Examination-Participation Restrictions Community Activity;Shop    Stability/Clinical Decision Making Stable/Uncomplicated    Clinical Decision Making Low    Rehab Potential Good    PT Frequency 1x / week    PT Duration 12 weeks    PT Treatment/Interventions ADLs/Self Care Home Management;Functional mobility training;Patient/family education;Therapeutic activities;Therapeutic exercise;Neuromuscular re-education;Manual techniques;Energy conservation;Passive range of motion;Scar mobilization;Taping    PT Next Visit Plan core, hip, glute strengthening/stretching, go over HEP and urge drill    PT Home Exercise Plan [id number]    Consulted and Agree with Plan of Care Patient             Patient will benefit from skilled therapeutic intervention in order to improve the following deficits and impairments:  Decreased endurance, Decreased activity tolerance, Improper body mechanics, Impaired flexibility, Decreased mobility, Decreased strength, Postural dysfunction  Visit Diagnosis: Unspecified lack of coordination  Muscle weakness (generalized)  Other abnormalities of gait and mobility     Problem List Patient Active Problem List   Diagnosis Date Noted   Migraines 07/31/2020   Genetic testing - OvaNext gene panel 10/09/2014   Breast cancer of upper-outer quadrant of left female breast ([place]) 08/22/2014    Stacy Gardner, PT 06/15/2209:13 AM   [facility] [address] [place], [place], [address] Phone: [phone]   Fax:  [phone]  Name: Kim Armstrong MRN: [id number] Date of Birth: [birth year]

## 2021-06-15 NOTE — Patient Instructions (Signed)

## 2021-06-22 ENCOUNTER — Encounter: Payer: Managed Care, Other (non HMO) | Admitting: Physical Therapy

## 2021-06-24 ENCOUNTER — Ambulatory Visit: Payer: Managed Care, Other (non HMO) | Admitting: Physical Therapy

## 2021-06-24 ENCOUNTER — Other Ambulatory Visit: Payer: Self-pay

## 2021-06-24 DIAGNOSIS — R279 Unspecified lack of coordination: Secondary | ICD-10-CM

## 2021-06-24 DIAGNOSIS — R2689 Other abnormalities of gait and mobility: Secondary | ICD-10-CM

## 2021-06-24 DIAGNOSIS — M6281 Muscle weakness (generalized): Secondary | ICD-10-CM

## 2021-06-24 NOTE — Therapy (Signed)
[facility] [address], [place], [place], [address] Phone: [phone]   Fax:  [phone]  Physical Therapy Treatment  Patient Details  Name: Kim Armstrong MRN: [id number] Date of Birth: 01-21-1976 Referring Provider (PT): Isaac Bliss, Rayford Halsted, MD   Encounter Date: 06/24/2021   PT End of Session - 06/24/21 0845     Visit Number 3    Number of Visits 30    Date for PT Re-Evaluation 10/04/21    Authorization Type [employer]    Authorization - Visit Number 3    PT Start Time 0804    PT Stop Time [id number]    PT Time Calculation (min) 40 min    Activity Tolerance Patient tolerated treatment well    Behavior During Therapy [facility] for tasks assessed/performed             Past Medical History:  Diagnosis Date   Breast cancer [facility])    left    Past Surgical History:  Procedure Laterality Date   BREAST BIOPSY Left 10/14 and 21 of 2015   BREAST LUMPECTOMY Left 2016   SALPINGECTOMY  2010    There were no vitals filed for this visit.   Subjective Assessment - 06/24/21 0808     Subjective Pt reports she thinks the urge drill has been incredibly helpful and has less leakage but has been at home more this past week and thinks this has helped with stress of having to leak while out in community.    How long can you sit comfortably? no limitations    How long can you stand comfortably? no limitations    How long can you walk comfortably? no limitations    Patient Stated Goals to have less leakage    Currently in Pain? No/denies                            Pelvic Floor Special Questions - 06/24/21 0001     Pelvic Floor Internal Exam patient identified and patient confirms consent for PT to perform inernal soft tissue work and muscle strength and integrity assessment.    Exam Type Vaginal    Sensation WFL    Palpation pt denied all pain or tenderness    Strength fair squeeze, definite  lift    Strength # of reps 5    Strength # of seconds 5    Tone decreased               [facility] Adult PT Treatment/Exercise - 06/24/21 0001       Self-Care   Self-Care Other Self-Care Comments    Other Self-Care Comments  Pt educated on activation breathing pattern with TA activations, vaginal weights, and Kegel exercises.      Neuro Re-ed    Neuro Re-ed Details  pt directed in 3x10 pelvic floor contractions, quick release in each quadrant to increase activation and muscle recruitment with first 2 reps; 3x5 quick flicks; [id number] contractions with 8s isometric holds. tactile and verbal feedback utlized for improved contractions and relaxations of pelvic floor throughout treatment.      Exercises   Exercises Lumbar      Lumbar Exercises: Standing   Other Standing Lumbar Exercises palloffs green band x10 each; x10 rotation palloffs                    PT Education - 06/24/21 0844     Education Details  Pt educated on HEP, continuing urge drill which pt reports has been very helpful so far with less amount and frequency of leakage, kegels and frequency to complete.    Person(s) Educated Patient    Methods Explanation;Demonstration;Tactile cues;Verbal cues    Comprehension Verbalized understanding;Returned demonstration              PT Short Term Goals - 06/03/21 0905       PT SHORT TERM GOAL #1   Title Pt to be I with HEP    Time 6    Period Weeks    Status New    Target Date 07/15/21      PT SHORT TERM GOAL #2   Title pt to demonstrate at least 4/5 globally at internal pelvic floor to decrease urinary leakage    Time 6    Period Weeks    Status New    Target Date 07/15/21      PT SHORT TERM GOAL #3   Title pt to demonstrate ability to self correct breathing mechanics, posture and proper contract/relax of pelvic floor and core with activity at least 50% of the time    Time 6    Status New    Target Date 07/15/21      PT SHORT TERM GOAL #4   Title pt to  report no more than 2 episodes of urinary leakage per day    Baseline constant    Time 6    Period Weeks    Status New    Target Date 07/15/21               PT Long Term Goals - 06/03/21 0909       PT LONG TERM GOAL #1   Baseline pt to be I with advanced HEP    Time 4    Period Months    Status New    Target Date 10/04/21      PT LONG TERM GOAL #2   Title pt to demonstrate ability to self correct breathing mechanics, posture and proper contract/relax of pelvic floor and core with activity at least 75% of the time    Time 4    Period Months    Status New    Target Date 10/04/21      PT LONG TERM GOAL #3   Title pt to report no more than 1 instance of urinary leakage per day with activity    Time 4    Period Months    Status New    Target Date 10/04/21                   Plan - 06/24/21 0846     Clinical Impression Statement Pt presents to clinic reporting she feels the urge drill has helped her alot in less leakage in both amount and frequency. Pt more aware of timing bladder voids to attempt to urinate between 2-4 hours, is sometimes able to make 2 hours. Pt educated on continuing urge drill, kegel technique and frequency, had some questions about vaginal weights all answered. Pt session focused on internal NMRE treatment for improvement of strength, endurance and coordination of pelvic floor; education of kegels, HEP, and vaginal weights and core stability. Pt would benefit from continued PT for improved deficits.    Personal Factors and Comorbidities Time since onset of injury/illness/exacerbation;Fitness;Comorbidity 1    Comorbidities h/o breast cancer    Examination-Participation Restrictions Community Activity;Shop    Stability/Clinical Decision Making Stable/Uncomplicated  Clinical Decision Making Low    Rehab Potential Good    PT Frequency 1x / week    PT Duration 12 weeks    PT Treatment/Interventions ADLs/Self Care Home Management;Functional  mobility training;Patient/family education;Therapeutic activities;Therapeutic exercise;Neuromuscular re-education;Manual techniques;Energy conservation;Passive range of motion;Scar mobilization;Taping    PT Next Visit Plan core, hip, glute strengthening/stretching, go over HEP and urge drill    PT Home Exercise Plan [id number]    Consulted and Agree with Plan of Care Patient             Patient will benefit from skilled therapeutic intervention in order to improve the following deficits and impairments:  Decreased endurance, Decreased activity tolerance, Improper body mechanics, Impaired flexibility, Decreased mobility, Decreased strength, Postural dysfunction  Visit Diagnosis: Lack of coordination  Muscle weakness (generalized)  Other abnormalities of gait and mobility     Problem List Patient Active Problem List   Diagnosis Date Noted   Migraines 07/31/2020   Genetic testing - OvaNext gene panel 10/09/2014   Breast cancer of upper-outer quadrant of left female breast ([place]) 08/22/2014    Stacy Gardner, PT 08/18/229:45 AM   [facility] [address] [place], [place], [address] Phone: [phone]   Fax:  [phone]  Name: Vantasia Signs MRN: [id number] Date of Birth: [birth year]

## 2021-06-29 ENCOUNTER — Encounter: Payer: Managed Care, Other (non HMO) | Admitting: Physical Therapy

## 2021-07-06 ENCOUNTER — Encounter: Payer: Managed Care, Other (non HMO) | Admitting: Physical Therapy

## 2021-07-13 ENCOUNTER — Encounter: Payer: Managed Care, Other (non HMO) | Admitting: Physical Therapy

## 2021-07-14 ENCOUNTER — Encounter: Payer: Managed Care, Other (non HMO) | Admitting: Physical Therapy

## 2021-07-20 ENCOUNTER — Encounter: Payer: Managed Care, Other (non HMO) | Admitting: Physical Therapy

## 2021-07-26 ENCOUNTER — Ambulatory Visit: Payer: Managed Care, Other (non HMO) | Admitting: Physical Therapy

## 2021-07-27 ENCOUNTER — Encounter: Payer: Managed Care, Other (non HMO) | Admitting: Physical Therapy

## 2021-08-03 ENCOUNTER — Other Ambulatory Visit: Payer: Self-pay

## 2021-08-03 ENCOUNTER — Encounter: Payer: Self-pay | Admitting: Physical Therapy

## 2021-08-03 ENCOUNTER — Ambulatory Visit: Payer: Managed Care, Other (non HMO) | Attending: Internal Medicine | Admitting: Physical Therapy

## 2021-08-03 DIAGNOSIS — M6281 Muscle weakness (generalized): Secondary | ICD-10-CM | POA: Diagnosis not present

## 2021-08-03 NOTE — Therapy (Signed)
[facility] [address] [place], [place], [address] Phone: [phone]   Fax:  [phone]  Physical Therapy Treatment  Patient Details  Name: Kim Armstrong MRN: [id number] Date of Birth: May 16, 1976 Referring Provider (PT): Isaac Bliss, Rayford Halsted, MD   Encounter Date: 08/03/2021   PT End of Session - 08/03/21 0835     Visit Number 4    Number of Visits 30    Date for PT Re-Evaluation 10/04/21    Authorization Type Cign[employer]    Authorization - Visit Number 4    PT Start Time 0800    PT Stop Time 0830    PT Time Calculation (min) 30 min    Activity Tolerance Patient tolerated treatment well    Behavior During Therapy [facility] for tasks assessed/performed             Past Medical History:  Diagnosis Date   Breast cancer [facility])    left    Past Surgical History:  Procedure Laterality Date   BREAST BIOPSY Left 10/14 and 21 of 2015   BREAST LUMPECTOMY Left 2016   SALPINGECTOMY  2010    There were no vitals filed for this visit.   Subjective Assessment - 08/03/21 0806     Subjective Pt reports she has been really paying attention to when and how much she is leaking and has now been more convienced she is not leaking urine and more of a clear or milky discharge, as she doesn't have leakage when she wears a tampon and her leakage doesn't smell like urine. However she reports greatly improved urgency and frequency, now urinating around 4 hours and urge drill has improved her stress with finding a bathroom when out in community and feels more confident with this now.    Pertinent History breast cancer 2016    How long can you sit comfortably? no limitations    How long can you stand comfortably? no limitations    How long can you walk comfortably? no limitations    Patient Stated Goals to have less leakage    Currently in Pain? No/denies               No emotional/communication barriers or  cognitive limitation. Patient is motivated to learn. Patient understands and agrees with treatment goals and plan. PT explains patient will be examined in standing, sitting, and lying down to see how their muscles and joints work. When they are ready, they will be asked to remove their underwear so PT can examine their perineum. The patient is also given the option of providing their own chaperone as one is not provided in our facility. The patient also has the right and is explained the right to defer or refuse any part of the evaluation or treatment including the internal exam. With the patient's consent, PT will use one gloved finger to gently assess the muscles of the pelvic floor, seeing how well it contracts and relaxes and if there is muscle symmetry. After, the patient will get dressed and PT and patient will discuss exam findings and plan of care. PT and patient discuss plan of care, schedule, attendance policy and HEP activities.              Pelvic Floor Special Questions - 08/03/21 0001     Skin Integrity Irritaion present at;Erthema    Skin Integrity Irritation Present at vulva and labia majora mildly    External Palpation no pain or tenderness per  pt    Pelvic Floor Internal Exam patient identified and patient confirms consent for PT to perform internal soft tissue work and muscle strength and integrity assessment.    Exam Type Vaginal    Sensation WFL    Palpation pt denied all pain or tenderness    Strength good squeeze, good lift, able to hold agaisnt strong resistance    Strength # of reps 8    Strength # of seconds 10    Tone improved               [facility] Adult PT Treatment/Exercise - 08/03/21 0001       Self-Care   Self-Care Other Self-Care Comments    Other Self-Care Comments  Pt educated to see gynocologist about discharge for further evaluation as she now reports she feels her leakage was not or at least not now is urine and more of a vaginal discharge. Pt  educated on continuation of urge drill as needed with increased urge to allow time to get to toliet without fear of leakage. Pt also shown travel female urinal for emergency need of urination while on a roadtrip with inability to stop as pt reported this is a barrier for her to be able to travel as she fears she won't be able to stop and get to a bathroom quick enough.      Neuro Re-ed    Neuro Re-ed Details  Pt directed in contract/relax x5 in all quadrants wiht 4/5 strength noted and good coordination demonstrated, only one VC for breathing technique and to complete full relaxation. Pt also directed in N23 quick flicks with improved ability to coordinate movement and more quickly.                     PT Education - 08/03/21 [phone]     Education Details Pt educated on seeing gyno for continued needs with discharge, pt reports she doesn't think she is leaking urine any longer and now able to urinate between 2-4 hours with average being on 4hour and educated to continue urge drill as needed, female travel urinal if needed.    Person(s) Educated Patient    Methods Explanation;Demonstration;Tactile cues;Verbal cues    Comprehension Returned demonstration;Verbalized understanding              PT Short Term Goals - 08/03/21 0845       PT SHORT TERM GOAL #1   Title Pt to be I with HEP    Time 6    Period Weeks    Status Achieved    Target Date 07/15/21      PT SHORT TERM GOAL #2   Title pt to demonstrate at least 4/5 globally at internal pelvic floor to decrease urinary leakage    Time 6    Period Weeks    Status Achieved    Target Date 07/15/21      PT SHORT TERM GOAL #3   Title pt to demonstrate ability to self correct breathing mechanics, posture and proper contract/relax of pelvic floor and core with activity at least 50% of the time    Time 6    Status Achieved    Target Date 07/15/21      PT SHORT TERM GOAL #4   Title pt to report no more than 2 episodes of  urinary leakage per day    Baseline constant    Time 6    Period Weeks    Status Achieved  Target Date 07/15/21               PT Long Term Goals - 08/03/21 0845       PT LONG TERM GOAL #1   Baseline pt to be I with advanced HEP    Time 4    Period Months    Status Achieved      PT LONG TERM GOAL #2   Title pt to demonstrate ability to self correct breathing mechanics, posture and proper contract/relax of pelvic floor and core with activity at least 75% of the time    Time 4    Period Months    Status Achieved      PT LONG TERM GOAL #3   Title pt to report no more than 1 instance of urinary leakage per day with activity    Time 4    Period Months    Status Achieved                   Plan - 08/03/21 0835     Clinical Impression Statement pt presents to clinic reporting she has been paying attention to leakage patterns and timing and reports she no longer thinks she is leaking urine or at least is no longer leaking urine and thinks she is having more vaginal discharge. Pt reports she wore a tampon and did not have leakage throughout the day. Pt reports discharge as clear or milkly in nature and denied any smell of urine or foul odor. Pt educated to see gynocologist for additional needs at this time for this. Pt agreed and reports she is due for an exam anyway and will schedule this. Pt reports she does feel PFPT has been helpful for her and no longer having as frequent or intense of urge to urinate, decreased frequency and much more confident to be in community than prior. Pt requested to do internal pelvic treatment this session to see if there had been changes since last visit and for today to be last visit as she has had improvement with urine/urge problems. Pt demonstrated good technique with internal treatment and improved strength, endurance and coordination compared to previous treatment. Pt did continue to have noted redness at labia and vulva and pt reports she  knew this. Pt tolerated treatment well and has no concerns about DC today or questions.    Personal Factors and Comorbidities Time since onset of injury/illness/exacerbation;Fitness;Comorbidity 1    Comorbidities h/o breast cancer    Examination-Activity Limitations Continence    Stability/Clinical Decision Making Stable/Uncomplicated    Clinical Decision Making Low    Rehab Potential Good    PT Treatment/Interventions ADLs/Self Care Home Management;Functional mobility training;Patient/family education;Therapeutic activities;Therapeutic exercise;Neuromuscular re-education;Manual techniques;Energy conservation;Passive range of motion;Scar mobilization;Taping    PT Home Exercise Plan [id number]    Consulted and Agree with Plan of Care Patient             Patient will benefit from skilled therapeutic intervention in order to improve the following deficits and impairments:     Visit Diagnosis: Muscle weakness (generalized)     Problem List Patient Active Problem List   Diagnosis Date Noted   Migraines 07/31/2020   Genetic testing - OvaNext gene panel 10/09/2014   Breast cancer of upper-outer quadrant of left female breast ([place]) 08/22/2014   PHYSICAL THERAPY DISCHARGE SUMMARY  Visits from Start of Care: 4  Current functional level related to goals / functional outcomes: Pt has met all goals and reports no urine  leakage at this time   Remaining deficits: Pt does still have discharge which wets underwear and she needs use of panty liner for this but reports this is not urine and is going to see gynecologist for this.    Education / Equipment: HEP and to follow up with gynecologist for discharge   Patient agrees to discharge. Patient goals were . Patient is being discharged due to metmeeting the stated rehab goals. Thank you for the referral.    Junie Panning, PT 08/03/2021, 8:46 AM  [facility] [address] [place], [place], [address] Phone: [phone]   Fax:  [phone]  Name: Kim Armstrong MRN: [id number] Date of Birth: [birth year]

## 2021-08-10 ENCOUNTER — Encounter: Payer: Managed Care, Other (non HMO) | Admitting: Physical Therapy

## 2021-08-17 ENCOUNTER — Encounter: Payer: Managed Care, Other (non HMO) | Admitting: Physical Therapy

## 2021-08-18 ENCOUNTER — Other Ambulatory Visit: Payer: Self-pay | Admitting: Hematology and Oncology

## 2021-08-24 ENCOUNTER — Encounter: Payer: Managed Care, Other (non HMO) | Admitting: Physical Therapy

## 2021-08-31 ENCOUNTER — Encounter: Payer: Managed Care, Other (non HMO) | Admitting: Physical Therapy

## 2021-09-07 ENCOUNTER — Encounter: Payer: Managed Care, Other (non HMO) | Admitting: Physical Therapy

## 2021-09-14 ENCOUNTER — Encounter: Payer: Managed Care, Other (non HMO) | Admitting: Physical Therapy

## 2021-09-21 ENCOUNTER — Encounter: Payer: Managed Care, Other (non HMO) | Admitting: Physical Therapy

## 2021-09-28 ENCOUNTER — Encounter: Payer: Managed Care, Other (non HMO) | Admitting: Physical Therapy

## 2022-05-03 ENCOUNTER — Telehealth: Payer: Self-pay | Admitting: Internal Medicine

## 2022-07-06 ENCOUNTER — Other Ambulatory Visit: Payer: Self-pay | Admitting: Obstetrics and Gynecology

## 2022-09-12 ENCOUNTER — Encounter: Payer: Self-pay | Admitting: Family Medicine

## 2022-09-12 ENCOUNTER — Ambulatory Visit (INDEPENDENT_AMBULATORY_CARE_PROVIDER_SITE_OTHER): Payer: Managed Care, Other (non HMO) | Admitting: Family Medicine

## 2022-09-12 VITALS — BP 124/80 | HR 75 | Temp 98.1°F | Wt 183.2 lb

## 2022-09-12 DIAGNOSIS — J019 Acute sinusitis, unspecified: Secondary | ICD-10-CM

## 2022-09-12 MED ORDER — AZITHROMYCIN 250 MG PO TABS: ORAL_TABLET | ORAL | 0 refills | Status: DC

## 2022-09-12 NOTE — Progress Notes (Signed)
   Subjective:    Patient ID: Kim Armstrong, female    DOB: 05/27/1976, 46 y.o.   MRN: [id number]  HPI Here for 10 days of sinus congestion, PND, and coughing up green sputum. No fever or ST or SOB. She tested negative for Covid at home. Using Nyquil and Dayquil.    Review of Systems  Constitutional: Negative.   HENT:  Positive for congestion, postnasal drip and sinus pressure. Negative for ear pain and sore throat.   Eyes: Negative.   Respiratory:  Positive for cough. Negative for shortness of breath and wheezing.        Objective:   Physical Exam Constitutional:      Appearance: Normal appearance. She is not ill-appearing.  HENT:     Right Ear: Tympanic membrane, ear canal and external ear normal.     Left Ear: Tympanic membrane, ear canal and external ear normal.     Nose: Nose normal.     Mouth/Throat:     Pharynx: Oropharynx is clear.  Eyes:     Conjunctiva/sclera: Conjunctivae normal.  Pulmonary:     Effort: Pulmonary effort is normal.     Breath sounds: Normal breath sounds.  Lymphadenopathy:     Cervical: No cervical adenopathy.  Neurological:     Mental Status: She is alert.           Assessment & Plan:  Sinusitis, treat with a Zpack.  Alysia Penna, MD

## 2022-09-21 ENCOUNTER — Encounter (HOSPITAL_BASED_OUTPATIENT_CLINIC_OR_DEPARTMENT_OTHER): Payer: Self-pay | Admitting: Obstetrics and Gynecology

## 2022-09-22 ENCOUNTER — Encounter (HOSPITAL_BASED_OUTPATIENT_CLINIC_OR_DEPARTMENT_OTHER): Payer: Self-pay | Admitting: Obstetrics and Gynecology

## 2022-09-22 NOTE — Progress Notes (Signed)
Spoke w/ via phone for pre-op interview--- pt Lab needs dos---- urine preg  (per anes)           Lab results------ no COVID test -----patient states asymptomatic no test needed Arrive at ------- 0915 on 09-28-2022 NPO after MN NO Solid Food.  Clear liquids from MN until--- 0815 Med rec completed Medications to take morning of surgery ----- may take migraine prn med if needed Diabetic medication ----- n/a Patient instructed no nail polish to be worn day of surgery Patient instructed to bring photo id and insurance card day of surgery Patient aware to have Driver (ride ) / caregiver for 24 hours after surgery --- daughter, hermione Patient Special Instructions ----- n/a Pre-Op special Istructions ----- sent inbox message to dr Landry Mellow, requested orders Patient verbalized understanding of instructions that were given at this phone interview. Patient denies shortness of breath, chest pain, fever, cough at this phone interview.

## 2022-09-27 ENCOUNTER — Emergency Department (HOSPITAL_BASED_OUTPATIENT_CLINIC_OR_DEPARTMENT_OTHER)
Admission: EM | Admit: 2022-09-27 | Discharge: 2022-09-27 | Disposition: A | Discharge status: Home / Self Care | Payer: Managed Care, Other (non HMO) | Attending: Emergency Medicine | Admitting: Emergency Medicine

## 2022-09-27 ENCOUNTER — Emergency Department (HOSPITAL_BASED_OUTPATIENT_CLINIC_OR_DEPARTMENT_OTHER): Payer: Managed Care, Other (non HMO)

## 2022-09-27 ENCOUNTER — Emergency Department (HOSPITAL_BASED_OUTPATIENT_CLINIC_OR_DEPARTMENT_OTHER): Payer: Managed Care, Other (non HMO) | Admitting: Radiology

## 2022-09-27 ENCOUNTER — Encounter (HOSPITAL_BASED_OUTPATIENT_CLINIC_OR_DEPARTMENT_OTHER): Payer: Self-pay

## 2022-09-27 ENCOUNTER — Other Ambulatory Visit: Payer: Self-pay | Admitting: Obstetrics and Gynecology

## 2022-09-27 ENCOUNTER — Other Ambulatory Visit: Payer: Self-pay

## 2022-09-27 DIAGNOSIS — M542 Cervicalgia: Secondary | ICD-10-CM | POA: Insufficient documentation

## 2022-09-27 DIAGNOSIS — R0602 Shortness of breath: Secondary | ICD-10-CM | POA: Diagnosis present

## 2022-09-27 DIAGNOSIS — Z853 Personal history of malignant neoplasm of breast: Secondary | ICD-10-CM | POA: Diagnosis not present

## 2022-09-27 DIAGNOSIS — R059 Cough, unspecified: Secondary | ICD-10-CM | POA: Diagnosis not present

## 2022-09-27 DIAGNOSIS — N920 Excessive and frequent menstruation with regular cycle: Secondary | ICD-10-CM

## 2022-09-27 DIAGNOSIS — R079 Chest pain, unspecified: Secondary | ICD-10-CM

## 2022-09-27 DIAGNOSIS — R1010 Upper abdominal pain, unspecified: Secondary | ICD-10-CM | POA: Diagnosis not present

## 2022-09-27 DIAGNOSIS — R0781 Pleurodynia: Secondary | ICD-10-CM | POA: Insufficient documentation

## 2022-09-27 LAB — HEPATIC FUNCTION PANEL
ALT: 12 U/L (ref 0–44)
AST: 14 U/L — ABNORMAL LOW (ref 15–41)
Albumin: 4.7 g/dL (ref 3.5–5.0)
Alkaline Phosphatase: 52 U/L (ref 38–126)
Bilirubin, Direct: 0.1 mg/dL (ref 0.0–0.2)
Indirect Bilirubin: 0.4 mg/dL (ref 0.3–0.9)
Total Bilirubin: 0.5 mg/dL (ref 0.3–1.2)
Total Protein: 7.3 g/dL (ref 6.5–8.1)

## 2022-09-27 LAB — BASIC METABOLIC PANEL
Anion gap: 11 (ref 5–15)
BUN: 11 mg/dL (ref 6–20)
CO2: 21 mmol/L — ABNORMAL LOW (ref 22–32)
Calcium: 10 mg/dL (ref 8.9–10.3)
Chloride: 106 mmol/L (ref 98–111)
Creatinine, Ser: 0.75 mg/dL (ref 0.44–1.00)
GFR, Estimated: >60 mL/min (ref >60)
Glucose, Bld: 99 mg/dL (ref 70–99)
Potassium: 3.9 mmol/L (ref 3.5–5.1)
Sodium: 138 mmol/L (ref 135–145)

## 2022-09-27 LAB — TROPONIN I (HIGH SENSITIVITY): Troponin I (High Sensitivity): <2 ng/L (ref <18)

## 2022-09-27 LAB — CBC
HCT: 34.6 % — ABNORMAL LOW (ref 36.0–46.0)
Hemoglobin: 11 g/dL — ABNORMAL LOW (ref 12.0–15.0)
MCH: 24.3 pg — ABNORMAL LOW (ref 26.0–34.0)
MCHC: 31.8 g/dL (ref 30.0–36.0)
MCV: 76.4 fL — ABNORMAL LOW (ref 80.0–100.0)
Platelets: 329 10*3/uL (ref 150–400)
RBC: 4.53 MIL/uL (ref 3.87–5.11)
RDW: 15 % (ref 11.5–15.5)
WBC: 6.4 10*3/uL (ref 4.0–10.5)
nRBC: 0 % (ref 0.0–0.2)

## 2022-09-27 LAB — LIPASE, BLOOD: Lipase: 23 U/L (ref 11–51)

## 2022-09-27 LAB — D-DIMER, QUANTITATIVE: D-Dimer, Quant: 0.38 ug/mL-FEU (ref 0.00–0.50)

## 2022-09-27 LAB — HCG, SERUM, QUALITATIVE: Preg, Serum: NEGATIVE

## 2022-09-27 NOTE — ED Notes (Signed)
Patient verbalizes understanding of discharge instructions. Opportunity for questioning and answers were provided. Patient discharged from ED.  °

## 2022-09-27 NOTE — ED Triage Notes (Signed)
Pt c/o SHOB, intermittent substernal/epigastric pain since yesterday AM, worse w deep breath, better at rest. Advises neck/ shoulders are stiff. Denies N/V, radiation, denies cardiac/resp history

## 2022-09-27 NOTE — Discharge Instructions (Signed)
If you develop recurrent, continued, or worsening chest pain, shortness of breath, fever, vomiting, abdominal or back pain, or any other new/concerning symptoms then return to the ER for evaluation.  

## 2022-09-27 NOTE — H&P (Signed)
Reason for Appointment 1. Preop Visit History of Present Illness General:         46 y/o presents preop visit. Pt is shedule for a hysteroscopy D&C possible polypectomy with myosure and hydrothermal ablation on 09/24/2022 for the management of menorrhagia with regular cycle and endometrial mass.        IN REVIEW:        patient reported soaking through a super plus tampon and pad within an hour. Her menses were previously 5 days they have become 7-8 days long. she report 4 days of the 7 are heavy. this started 3-4 months ago. she tried lysteda that was prescribed by [facility] with her last menses and it helped.         She has a personal history of breast cancer of the left breast diagnosed in 2015. she had Lumpectomy/ chemotherapy/ radiation and 6 1/2 year of tamoxifen therapy. she stopped the tamoxifen aroudn 2021.         GYN ULTRASOUND FINDINGS ON 06/08/2022:         Anteverted uterus Measuring 10.8 cm x 5.9 cm x 7.0 cm the endometrium is 1.9 cm         No uterine anomalies         Endometrium thickened with hyperechoic mass-2.8x1.9x1.1cm. No blood flow noted.         Endo 1.90 cm         Rt OV wnl         Lt OV simple follicle 9.7D5.6x1.2cm. Avascular         no adnexal masses seen.         Lt Essure seen.        EMB performed on 07/06/2022 was benign.   Current Medications Taking Ferrous Sulfate 325 (65 Fe) MG Tablet 1 tablet Orally once a day with food Lysteda 650 MG Tablet 2 tablets Orally Three times daily Zofran 4 MG Tablet 1 tablet Orally Once a day as needed Relpax(Eletriptan Hydrobromide) 20 MG Tablet 1 tablet Orally Once a day , Notes to Pharmacist: prn for migranes Ubrelvy(Ubrogepant) 100 MG Tablet 1 tablet may take second dose at least 2 hours after first dose as needed Orally Once a day Advil 200 MG Capsule 1 capsule Orally as needed Ajovy(Fremanezumab-vfrm) 225 MG/1.5ML Solution Prefilled Syringe 1.5 mL Subcutaneous once a month Naproxen 500 MG Tablet 1 tablet Orally as  needed Medication List reviewed and reconciled with the patient Past Medical History      chronic Low back pain.      Headaches.      Breast Cancer,Stage 2, genetic mutations negative. Surgical History       salpingectomy, Rt, laparoscopy and laparotomy dueto ruptured ectopic pregnancy       Essure in left tube only       right breast bx (benign) 03/2014       Left breast bx (cancerous) 08/2014       Left breast lumpectomy 01/29/2015 Family History Father: alive, type II diabetes Mother: alive, Dx with breast cancer at age 70 Paternal [place] Father: alive Paternal [place] Mother: deceased, stroke Maternal Grand Father: deceased, alzheimer Maternal [place] Mother: deceased, liver cancer Brother 1: alive, Healthy Brother2: alive, Healthy Sister 1: alive, Healthy 2 brother(s) , 1 sister(s) - healthy. 1 daughter(s) . Mother had breast cancer. Social History General:  Tobacco use  cigarettes:  Former smoker, Quit in year  Quit at age 40, Pack-year Hx:  33, Tobacco history last updated  09/12/2022,  Vaping  No. Alcohol: yes, occasionally. Caffeine: yes, 2+ servings daily, coffee. Recreational drug use: no. DIET: drinks water. Exercise: nothing structured. Marital Status: Married, Nurse, learning disability. Children: 1 Transgender Son. EDUCATION: Masters. OCCUPATION: Camera operator. Tobacco Exposure: no. Gyn History Sexual activity currently sexually active.  Periods : regular, recently became very heavy.  LMP 08/12/2022.  Birth control essure. left tube.  Last pap smear date 11/27/2019 - neg/HPV neg.  Last mammogram date 08/08/2022 benign..  Denies H/O Abnormal pap smear.  Denies H/O STD.  Menarche 57.  GYN procedures Hysteroscopy & Essure 04/28/11.  OB History Number of pregnancies  4.  miscarriages  2.  Pregnancy # 1  live birth, vaginal delivery.  Pregnancy # 2  tubal, ectopic, right.  Pregnancy # 3  miscarriage.  Pregnancy # 4:  abortion.  Allergies N.K.D.A. Hospitalization/Major Diagnostic  Procedure see surgery childbirth Review of Systems CONSTITUTIONAL:         Chills No.  Fatigue No.  Fever No.  Night sweats No.  Recent travel outside [place] No.  Sweats No.  Weight change No.     OPHTHALMOLOGY:         Blurring of vision no.  Change in vision no.  Double vision no.     ENT:         Dizziness no.  Nose bleeds no.  Sore throat no.  Teeth pain no.     ALLERGY:         Hives no.     CARDIOLOGY:         Chest pain no.  High blood pressure no.  Irregular heart beat no.  Leg edema no.  Palpitations no.     RESPIRATORY:         Shortness of breath no.  Cough no.  Wheezing no.     UROLOGY:         Pain with urination no.  Urinary urgency no.  Urinary frequency no.  Urinary incontinence no.  Difficulty urinating No.  Blood in urine No.     GASTROENTEROLOGY:         Abdominal pain no.  Appetite change no.  Bloating/belching no.  Blood in stool or on toilet paper no.  Change in bowel movements no.  Constipation no.  Diarrhea no.  Difficulty swallowing no.  Nausea no.     FEMALE REPRODUCTIVE:         Vulvar pain no.  Vulvar rash no.  Abnormal vaginal bleeding , heavy menses.  Breast pain no.  Nipple discharge no.  Pain with intercourse no.  Pelvic pain no.  Unusual vaginal discharge no.  Vaginal itching no.     MUSCULOSKELETAL:         Muscle aches no.     NEUROLOGY:         Headache no.  Tingling/numbness no.  Weakness no.     PSYCHOLOGY:         Depression no.  Anxiety no.  Nervousness no.  Sleep disturbances no.  Suicidal ideation no .     ENDOCRINOLOGY:         Excessive thirst no.  Excessive urination no.  Hair loss no.  Heat or cold intolerance no.     HEMATOLOGY/LYMPH:         Abnormal bleeding no.  Easy bruising no.  Swollen glands no.     DERMATOLOGY:         New/changing skin lesion no.  Rash no.  Sores no.  Vital Signs Wt: 184.2, Wt change: 1.4 lbs, Ht: 67.5, BMI: 28.42, Pulse sitting: 75, BP sitting: 125/85. Examination General Examination:        CONSTITUTIONAL:  alert, oriented, NAD. SKIN:  moist, warm. EYES:  Conjunctiva clear. LUNGS: good I:E efffort noted, clear to auscultation bilaterally. HEART: regular rate and rhythm. ABDOMEN: soft, non-tender/non-distended, bowel sounds present. FEMALE GENITOURINARY: normal external genitalia, labia - unremarkable, vagina - pink moist mucosa, no lesions or abnormal discharge, cervix - no discharge or lesions or CMT, adnexa - no masses or tenderness, uterus - nontender and normal size on palpation. EXTREMITIES: normal range of motion, no edema present. PSYCH:  affect normal, good eye contact.  Physical Examination Chaperone present:         Chaperone present  Antonieta Loveless 09/12/2022 11:52:37 AM >, for pelvic exam.      Assessments 1. Menorrhagia with regular cycle - N92.0 (Primary) 2. Endometrial mass - N94.89 Treatment 1. Menorrhagia with regular cycle  Notes: She is scheduled for hysteroscopy D/C removal of endometrial mass and hydrothermal ablation . Pt is advised she will be able to return home the same day if she is doing well. Discussed risks of hysteroscopy including but not limited to infection, bleeding, possible perforation of the uterus, with the need for further surgery. Pt advised to avoid NSAIDs (Aspirin, Aleve, Advil, Ibuprofen, Motrin) from now until surgery given risk of bleeding during surgery. She may take Tylenol for pain management. She is advised to avoid eating or drinking starting midnight prior to surgery. Pt is advised that she may have watery discharge or cramping after surgery. Discussed post-surgery avoidance of driving for 24 hours or intercourse for 2 weeks after procedure.     2. Endometrial mass  Notes: She is scheduled for hysteroscopy D/C removal of endometrial mass and hydrothermal ablation . Pt is advised she will be able to return home the same day if she is doing well. Discussed risks of hysteroscopy including but not limited to infection, bleeding, possible perforation of  the uterus, with the need for further surgery. Pt advised to avoid NSAIDs (Aspirin, Aleve, Advil, Ibuprofen, Motrin) from now until surgery given risk of bleeding during surgery. She may take Tylenol for pain management. She is advised to avoid eating or drinking starting midnight prior to surgery. Pt is advised that she may have watery discharge or cramping after surgery. Discussed post-surgery avoidance of driving for 24 hours or intercourse for 2 weeks after procedure.

## 2022-09-27 NOTE — Progress Notes (Unsigned)
Reason for Appointment 1. Preop Visit History of Present Illness General:         46 y/o presents preop visit. Pt is shedule for a hysteroscopy D&C possible polypectomy with myosure and hydrothermal ablation on 09/24/2022 for the management of menorrhagia with regular cycle and endometrial mass.        IN REVIEW:        patient reported soaking through a super plus tampon and pad within an hour. Her menses were previously 5 days they have become 7-8 days long. she report 4 days of the 7 are heavy. this started 3-4 months ago. she tried lysteda that was prescribed by [facility] with her last menses and it helped.         She has a personal history of breast cancer of the left breast diagnosed in 2015. she had Lumpectomy/ chemotherapy/ radiation and 6 1/2 year of tamoxifen therapy. she stopped the tamoxifen aroudn 2021.         GYN ULTRASOUND FINDINGS ON 06/08/2022:         Anteverted uterus Measuring 10.8 cm x 5.9 cm x 7.0 cm the endometrium is 1.9 cm         No uterine anomalies         Endometrium thickened with hyperechoic mass-2.8x1.9x1.1cm. No blood flow noted.         Endo 1.90 cm         Rt OV wnl         Lt OV simple follicle 4.4Y1.6x1.2cm. Avascular         no adnexal masses seen.         Lt Essure seen.        EMB performed on 07/06/2022 was benign.  Current Medications Taking Ferrous Sulfate 325 (65 Fe) MG Tablet 1 tablet Orally once a day with food Lysteda 650 MG Tablet 2 tablets Orally Three times daily Zofran 4 MG Tablet 1 tablet Orally Once a day as needed Relpax(Eletriptan Hydrobromide) 20 MG Tablet 1 tablet Orally Once a day , Notes to Pharmacist: prn for migranes Ubrelvy(Ubrogepant) 100 MG Tablet 1 tablet may take second dose at least 2 hours after first dose as needed Orally Once a day Advil 200 MG Capsule 1 capsule Orally as needed Ajovy(Fremanezumab-vfrm) 225 MG/1.5ML Solution Prefilled Syringe 1.5 mL Subcutaneous once a month Naproxen 500 MG Tablet 1 tablet Orally as  needed Medication List reviewed and reconciled with the patient Past Medical History      chronic Low back pain.      Headaches.      Breast Cancer,Stage 2, genetic mutations negative. Surgical History       salpingectomy, Rt, laparoscopy and laparotomy dueto ruptured ectopic pregnancy       Essure in left tube only       right breast bx (benign) 03/2014       Left breast bx (cancerous) 08/2014       Left breast lumpectomy 01/29/2015 Family History Father: alive, type II diabetes Mother: alive, Dx with breast cancer at age 57 Paternal [place] Father: alive Paternal [place] Mother: deceased, stroke Maternal Grand Father: deceased, alzheimer Maternal [place] Mother: deceased, liver cancer Brother 1: alive, Healthy Brother2: alive, Healthy Sister 1: alive, Healthy 2 brother(s) , 1 sister(s) - healthy. 1 daughter(s) . Mother had breast cancer. Social History General:  Tobacco use  cigarettes:  Former smoker, Quit in year  Quit at age 45, Pack-year Hx:  47, Tobacco history last updated  09/12/2022, Vaping  No. Alcohol: yes, occasionally. Caffeine: yes, 2+ servings daily, coffee. Recreational drug use: no. DIET: drinks water. Exercise: nothing structured. Marital Status: Married, Nurse, learning disability. Children: 1 Transgender Son. EDUCATION: Masters. OCCUPATION: Camera operator. Tobacco Exposure: no. Gyn History Sexual activity currently sexually active.  Periods : regular, recently became very heavy.  LMP 08/12/2022.  Birth control essure. left tube.  Last pap smear date 11/27/2019 - neg/HPV neg.  Last mammogram date 08/08/2022 benign..  Denies H/O Abnormal pap smear.  Denies H/O STD.  Menarche 79.  GYN procedures Hysteroscopy & Essure 04/28/11.  OB History Number of pregnancies  4.  miscarriages  2.  Pregnancy # 1  live birth, vaginal delivery.  Pregnancy # 2  tubal, ectopic, right.  Pregnancy # 3  miscarriage.  Pregnancy # 4:  abortion.  Allergies N.K.D.A. Hospitalization/Major Diagnostic  Procedure see surgery childbirth Review of Systems CONSTITUTIONAL:         Chills No.  Fatigue No.  Fever No.  Night sweats No.  Recent travel outside [place] No.  Sweats No.  Weight change No.     OPHTHALMOLOGY:         Blurring of vision no.  Change in vision no.  Double vision no.     ENT:         Dizziness no.  Nose bleeds no.  Sore throat no.  Teeth pain no.     ALLERGY:         Hives no.     CARDIOLOGY:         Chest pain no.  High blood pressure no.  Irregular heart beat no.  Leg edema no.  Palpitations no.     RESPIRATORY:         Shortness of breath no.  Cough no.  Wheezing no.     UROLOGY:         Pain with urination no.  Urinary urgency no.  Urinary frequency no.  Urinary incontinence no.  Difficulty urinating No.  Blood in urine No.     GASTROENTEROLOGY:         Abdominal pain no.  Appetite change no.  Bloating/belching no.  Blood in stool or on toilet paper no.  Change in bowel movements no.  Constipation no.  Diarrhea no.  Difficulty swallowing no.  Nausea no.     FEMALE REPRODUCTIVE:         Vulvar pain no.  Vulvar rash no.  Abnormal vaginal bleeding , heavy menses.  Breast pain no.  Nipple discharge no.  Pain with intercourse no.  Pelvic pain no.  Unusual vaginal discharge no.  Vaginal itching no.     MUSCULOSKELETAL:         Muscle aches no.     NEUROLOGY:         Headache no.  Tingling/numbness no.  Weakness no.     PSYCHOLOGY:         Depression no.  Anxiety no.  Nervousness no.  Sleep disturbances no.  Suicidal ideation no .     ENDOCRINOLOGY:         Excessive thirst no.  Excessive urination no.  Hair loss no.  Heat or cold intolerance no.     HEMATOLOGY/LYMPH:         Abnormal bleeding no.  Easy bruising no.  Swollen glands no.     DERMATOLOGY:         New/changing skin lesion no.  Rash no.  Sores no.  Vital Signs Wt: 184.2, Wt change: 1.4 lbs, Ht: 67.5, BMI: 28.42, Pulse sitting: 75, BP sitting: 125/85. Examination General Examination:        CONSTITUTIONAL:  alert, oriented, NAD. SKIN:  moist, warm. EYES:  Conjunctiva clear. LUNGS: good I:E efffort noted, clear to auscultation bilaterally. HEART: regular rate and rhythm. ABDOMEN: soft, non-tender/non-distended, bowel sounds present. FEMALE GENITOURINARY: normal external genitalia, labia - unremarkable, vagina - pink moist mucosa, no lesions or abnormal discharge, cervix - no discharge or lesions or CMT, adnexa - no masses or tenderness, uterus - nontender and normal size on palpation. EXTREMITIES: normal range of motion, no edema present. PSYCH:  affect normal, good eye contact.  Physical Examination Chaperone present:         Chaperone present  Antonieta Loveless 09/12/2022 11:52:37 AM >, for pelvic exam.      Assessments 1. Menorrhagia with regular cycle - N92.0 (Primary) 2. Endometrial mass - N94.89 Treatment 1. Menorrhagia with regular cycle  Notes: She is scheduled for hysteroscopy D/C removal of endometrial mass and hydrothermal ablation . Pt is advised she will be able to return home the same day if she is doing well. Discussed risks of hysteroscopy including but not limited to infection, bleeding, possible perforation of the uterus, with the need for further surgery. Pt advised to avoid NSAIDs (Aspirin, Aleve, Advil, Ibuprofen, Motrin) from now until surgery given risk of bleeding during surgery. She may take Tylenol for pain management. She is advised to avoid eating or drinking starting midnight prior to surgery. Pt is advised that she may have watery discharge or cramping after surgery. Discussed post-surgery avoidance of driving for 24 hours or intercourse for 2 weeks after procedure.    2. Endometrial mass  Notes: She is scheduled for hysteroscopy D/C removal of endometrial mass and hydrothermal ablation . Pt is advised she will be able to return home the same day if she is doing well. Discussed risks of hysteroscopy including but not limited to infection, bleeding, possible perforation of  the uterus, with the need for further surgery. Pt advised to avoid NSAIDs (Aspirin, Aleve, Advil, Ibuprofen, Motrin) from now until surgery given risk of bleeding during surgery. She may take Tylenol for pain management. She is advised to avoid eating or drinking starting midnight prior to surgery. Pt is advised that she may have watery discharge or cramping after surgery. Discussed post-surgery avoidance of driving for 24 hours or intercourse for 2 weeks after procedure.

## 2022-09-27 NOTE — H&P (Deleted)
  The note originally documented on this encounter has been moved the the encounter in which it belongs.  

## 2022-09-27 NOTE — ED Provider Notes (Signed)
[facility] DE[facility] Provider Note   CSN: [id number] Arrival date & time: 09/27/22  0957     History  No chief complaint on file.   Kim Armstrong is a 46 y.o. female.  HPI 46 year old female presents with shortness of breath and chest/upper abdominal discomfort.  Started yesterday morning.  For the most part is a constant sensation and is dull and achy.  It is right near her sternum.  However when she is active she feels more short of breath and has more discomfort.  It also worsens with inspiration.  She is also having pain whenever she eats, pretty much immediately.  No vomiting.  No fevers.  She has had a cough for a few weeks that is unchanged.  No leg swelling.  Has a history of breast cancer but has been cancer free for about 7 years. Her bilateral posterior neck/trapezius feels achy.  Home Medications Prior to Admission medications   Medication Sig Start Date End Date Taking? Authorizing Provider  acetaminophen (TYLENOL) 500 MG tablet Take 1,000 mg by mouth every 6 (six) hours as needed for mild pain or moderate pain.    [provider]  calcium carbonate (TUMS - DOSED IN MG ELEMENTAL CALCIUM) 500 MG chewable tablet Chew 1 tablet by mouth as needed for indigestion or heartburn.    [provider]  Fremanezumab-vfrm (AJOVY) 225 MG/1.5ML SOSY Inject into the skin every 30 (thirty) days.    [provider]  naproxen (NAPROSYN) 500 MG tablet Take 1 tablet (500 mg total) by mouth 2 (two) times daily with a meal. Patient taking differently: Take 500 mg by mouth 2 (two) times daily as needed. 03/20/21   Lisette Abu, PA-C  ondansetron (ZOFRAN ODT) 4 MG disintegrating tablet Take 1 tablet (4 mg total) by mouth every 8 (eight) hours as needed for nausea or vomiting. 08/06/20   Nicholas Lose, MD  rizatriptan (MAXALT) 10 MG tablet Take 10 mg by mouth as needed for migraine. May repeat in 2 hours if needed    [provider]  Ubrogepant (UBRELVY) 100 MG TABS Take by mouth as needed.    [provider]      Allergies    Patient has no known allergies.    Review of Systems   Review of Systems  Constitutional:  Negative for fever.  Respiratory:  Positive for cough and shortness of breath.   Cardiovascular:  Positive for chest pain.  Gastrointestinal:  Positive for abdominal pain.    Physical Exam Updated Vital Signs BP (!) 140/102   Pulse 88   Temp 98.1 F (36.7 C)   Resp (!) 22   Ht '5\' 7"'$  (1.702 m)   Wt 81.6 kg   LMP 09/26/2022 (Exact Date)   SpO2 100%   BMI 28.19 kg/m  Physical Exam Vitals and nursing note reviewed.  Constitutional:      Appearance: She is well-developed.  HENT:     Head: Normocephalic and atraumatic.  Cardiovascular:     Rate and Rhythm: Normal rate and regular rhythm.     Heart sounds: Normal heart sounds.  Pulmonary:     Effort: Pulmonary effort is normal.     Breath sounds: Normal breath sounds.  Abdominal:     General: There is no distension.     Palpations: Abdomen is soft.     Tenderness: There is no abdominal tenderness.  Skin:    General: Skin is warm and dry.  Neurological:  Mental Status: She is alert.     ED Results / Procedures / Treatments   Labs (all labs ordered are listed, but only abnormal results are displayed) Labs Reviewed  BASIC METABOLIC PANEL - Abnormal; Notable for the following components:      Result Value   CO2 21 (*)    All other components within normal limits  CBC - Abnormal; Notable for the following components:   Hemoglobin 11.0 (*)    HCT 34.6 (*)    MCV 76.4 (*)    MCH 24.3 (*)    All other components within normal limits  HEPATIC FUNCTION PANEL - Abnormal; Notable for the following components:   AST 14 (*)    All other components within normal limits  HCG, SERUM, QUALITATIVE  LIPASE, BLOOD  D-DIMER, QUANTITATIVE  TROPONIN I (HIGH SENSITIVITY)  TROPONIN I (HIGH SENSITIVITY)    EKG EKG  Interpretation  Date/Time:  Tuesday September 27 2022 10:30:17 EST Ventricular Rate:  86 PR Interval:  176 QRS Duration: 82 QT Interval:  353 QTC Calculation: 423 R Axis:   43 Text Interpretation: Sinus rhythm Low voltage, precordial leads no acute ST/T changes Confirmed by Sherwood Gambler [phone]) on 09/27/2022 10:57:56 AM  Radiology DG Chest 2 View  Result Date: 09/27/2022 CLINICAL DATA:  Chest pain EXAM: CHEST - 2 VIEW COMPARISON:  Chest 11/10/2017 FINDINGS: The heart size and mediastinal contours are within normal limits. Both lungs are clear. The visualized skeletal structures are unremarkable. Surgical clips left breast IMPRESSION: No active cardiopulmonary disease. Electronically Signed   By: Franchot Gallo M.D.   On: 09/27/2022 11:13   US Abdomen Limited RUQ (LIVER/GB)  Result Date: 09/27/2022 CLINICAL DATA:  Upper abdominal pain for 1 day. EXAM: ULTRASOUND ABDOMEN LIMITED RIGHT UPPER QUADRANT COMPARISON:  None Available. FINDINGS: Gallbladder: No gallstones or wall thickening visualized. No sonographic Murphy sign noted by sonographer. Common bile duct: Diameter: 6 mm.  No intrahepatic biliary dilatation. Liver: Probable small subcapsular cyst in the left lobe, measuring 5 mm in diameter. No suspicious liver lesions. Portal vein is patent on color Doppler imaging with normal direction of blood flow towards the liver. Other: None. IMPRESSION: No acute or significant right upper quadrant abdominal findings. Electronically Signed   By: Richardean Sale M.D.   On: 09/27/2022 11:09    Procedures Procedures    Medications Ordered in ED Medications - No data to display  ED Course/ Medical Decision Making/ A&P                           Medical Decision Making Amount and/or Complexity of Data Reviewed Labs: ordered.    Details: Normal troponin, normal ddimer, unremarkable LFTs Radiology: ordered and independent interpretation performed.    Details: No pneumonia on xray ECG/medicine  tests: independent interpretation performed.    Details: No acute ischemia   Patient presents with nonspecific chest/back pain.  Given her pleuritic symptoms, D-dimer obtained for otherwise low risk PE.  She does not have active cancer currently and no specific DVT/PE history.  She is also been dealing with a 3-week cough and this could be more pleurisy or chest wall discomfort.  Work-up is unremarkable including negative troponin with symptoms since yesterday.  I doubt ACS.  I doubt dissection.  Pain is inferior sternum/upper abdomen but right upper quadrant ultrasound is unremarkable and LFTs/lipase are normal.  At this point, I recommended NSAIDs and follow-up with PCP.  Given return precautions.  Final Clinical Impression(s) / ED Diagnoses Final diagnoses:  Nonspecific chest pain    Rx / DC Orders ED Discharge Orders     None         Sherwood Gambler, MD 09/27/22 1242

## 2022-09-28 ENCOUNTER — Ambulatory Visit (HOSPITAL_BASED_OUTPATIENT_CLINIC_OR_DEPARTMENT_OTHER)
Admission: RE | Admit: 2022-09-28 | Discharge: 2022-09-28 | Disposition: A | Discharge status: Home / Self Care | Payer: Managed Care, Other (non HMO) | Attending: Obstetrics and Gynecology | Admitting: Obstetrics and Gynecology

## 2022-09-28 ENCOUNTER — Other Ambulatory Visit: Payer: Self-pay

## 2022-09-28 ENCOUNTER — Encounter (HOSPITAL_BASED_OUTPATIENT_CLINIC_OR_DEPARTMENT_OTHER)
Admission: RE | Disposition: A | Discharge status: Home / Self Care | Payer: Self-pay | Source: Home / Self Care | Attending: Obstetrics and Gynecology

## 2022-09-28 ENCOUNTER — Encounter (HOSPITAL_BASED_OUTPATIENT_CLINIC_OR_DEPARTMENT_OTHER): Payer: Self-pay | Admitting: Obstetrics and Gynecology

## 2022-09-28 ENCOUNTER — Ambulatory Visit (HOSPITAL_BASED_OUTPATIENT_CLINIC_OR_DEPARTMENT_OTHER): Payer: Managed Care, Other (non HMO) | Admitting: Anesthesiology

## 2022-09-28 DIAGNOSIS — Z87891 Personal history of nicotine dependence: Secondary | ICD-10-CM | POA: Diagnosis not present

## 2022-09-28 DIAGNOSIS — N840 Polyp of corpus uteri: Secondary | ICD-10-CM | POA: Insufficient documentation

## 2022-09-28 DIAGNOSIS — R519 Headache, unspecified: Secondary | ICD-10-CM | POA: Insufficient documentation

## 2022-09-28 DIAGNOSIS — Z01818 Encounter for other preprocedural examination: Secondary | ICD-10-CM

## 2022-09-28 DIAGNOSIS — Z853 Personal history of malignant neoplasm of breast: Secondary | ICD-10-CM | POA: Diagnosis not present

## 2022-09-28 DIAGNOSIS — N920 Excessive and frequent menstruation with regular cycle: Secondary | ICD-10-CM | POA: Insufficient documentation

## 2022-09-28 DIAGNOSIS — Z923 Personal history of irradiation: Secondary | ICD-10-CM | POA: Diagnosis not present

## 2022-09-28 DIAGNOSIS — Z9221 Personal history of antineoplastic chemotherapy: Secondary | ICD-10-CM | POA: Insufficient documentation

## 2022-09-28 DIAGNOSIS — N858 Other specified noninflammatory disorders of uterus: Secondary | ICD-10-CM

## 2022-09-28 HISTORY — DX: Gastro-esophageal reflux disease without esophagitis: K21.9

## 2022-09-28 HISTORY — DX: Personal history of antineoplastic chemotherapy: Z92.21

## 2022-09-28 HISTORY — DX: Personal history of irradiation: Z92.3

## 2022-09-28 HISTORY — PX: DILATATION & CURETTAGE/HYSTEROSCOPY WITH MYOSURE: SHX6511

## 2022-09-28 HISTORY — DX: Excessive and frequent menstruation with regular cycle: N92.0

## 2022-09-28 HISTORY — DX: Other specified conditions associated with female genital organs and menstrual cycle: N94.89

## 2022-09-28 HISTORY — DX: Migraine without aura, not intractable, without status migrainosus: G43.009

## 2022-09-28 HISTORY — PX: DILITATION & CURRETTAGE/HYSTROSCOPY WITH HYDROTHERMAL ABLATION: SHX5570

## 2022-09-28 LAB — POCT PREGNANCY, URINE: Preg Test, Ur: NEGATIVE

## 2022-09-28 SURGERY — DILATATION & CURETTAGE/HYSTEROSCOPY WITH MYOSURE
Anesthesia: General | Site: Vagina

## 2022-09-28 MED ORDER — PHENYLEPHRINE HCL (PRESSORS) 10 MG/ML IV SOLN
INTRAVENOUS | Status: DC | PRN
  Administered 2022-09-28 (×2): 80 ug via INTRAVENOUS

## 2022-09-28 MED ORDER — DEXAMETHASONE SODIUM PHOSPHATE 4 MG/ML IJ SOLN
INTRAMUSCULAR | Status: DC | PRN
  Administered 2022-09-28: 8 mg via INTRAVENOUS

## 2022-09-28 MED ORDER — BUPIVACAINE HCL (PF) 0.25 % IJ SOLN
INTRAMUSCULAR | Status: DC | PRN
  Administered 2022-09-28: 20 mL

## 2022-09-28 MED ORDER — PROPOFOL 10 MG/ML IV BOLUS
INTRAVENOUS | Status: DC | PRN
  Administered 2022-09-28: 200 mg via INTRAVENOUS

## 2022-09-28 MED ORDER — FENTANYL CITRATE (PF) 100 MCG/2ML IJ SOLN
INTRAMUSCULAR | Status: DC | PRN
  Administered 2022-09-28 (×2): 50 ug via INTRAVENOUS

## 2022-09-28 MED ORDER — OXYCODONE HCL 5 MG PO TABS: 5.0000 mg | ORAL_TABLET | Freq: Four times a day (QID) | ORAL | 0 refills | Status: DC | PRN

## 2022-09-28 MED ORDER — AMISULPRIDE (ANTIEMETIC) 5 MG/2ML IV SOLN: 10.0000 mg | Freq: Once | INTRAVENOUS | Status: DC | PRN

## 2022-09-28 MED ORDER — PROMETHAZINE HCL 25 MG/ML IJ SOLN: 6.2500 mg | INTRAMUSCULAR | Status: DC | PRN

## 2022-09-28 MED ORDER — ACETAMINOPHEN 325 MG PO TABS: 325.0000 mg | ORAL_TABLET | ORAL | Status: DC | PRN

## 2022-09-28 MED ORDER — FENTANYL CITRATE (PF) 100 MCG/2ML IJ SOLN
25.0000 ug | INTRAMUSCULAR | Status: DC | PRN
  Administered 2022-09-28 (×2): 50 ug via INTRAVENOUS

## 2022-09-28 MED ORDER — FENTANYL CITRATE (PF) 100 MCG/2ML IJ SOLN
INTRAMUSCULAR | Status: AC
  Filled 2022-09-28: qty 2

## 2022-09-28 MED ORDER — ACETAMINOPHEN 500 MG PO TABS
ORAL_TABLET | ORAL | Status: AC
  Filled 2022-09-28: qty 2

## 2022-09-28 MED ORDER — OXYCODONE HCL 5 MG/5ML PO SOLN: 5.0000 mg | Freq: Once | ORAL | Status: DC | PRN

## 2022-09-28 MED ORDER — MIDAZOLAM HCL 2 MG/2ML IJ SOLN
INTRAMUSCULAR | Status: AC
  Filled 2022-09-28: qty 2

## 2022-09-28 MED ORDER — ACETAMINOPHEN 10 MG/ML IV SOLN: 1000.0000 mg | Freq: Once | INTRAVENOUS | Status: DC | PRN

## 2022-09-28 MED ORDER — LACTATED RINGERS IV SOLN: INTRAVENOUS | Status: DC

## 2022-09-28 MED ORDER — KETOROLAC TROMETHAMINE 30 MG/ML IJ SOLN
INTRAMUSCULAR | Status: DC | PRN
  Administered 2022-09-28: 30 mg via INTRAVENOUS

## 2022-09-28 MED ORDER — IBUPROFEN 800 MG PO TABS: 800.0000 mg | ORAL_TABLET | Freq: Three times a day (TID) | ORAL | 0 refills | Status: DC | PRN

## 2022-09-28 MED ORDER — ACETAMINOPHEN 160 MG/5ML PO SOLN: 325.0000 mg | ORAL | Status: DC | PRN

## 2022-09-28 MED ORDER — ONDANSETRON HCL 4 MG/2ML IJ SOLN
INTRAMUSCULAR | Status: DC | PRN
  Administered 2022-09-28: 4 mg via INTRAVENOUS

## 2022-09-28 MED ORDER — MIDAZOLAM HCL 2 MG/2ML IJ SOLN
INTRAMUSCULAR | Status: DC | PRN
  Administered 2022-09-28: 2 mg via INTRAVENOUS

## 2022-09-28 MED ORDER — SODIUM CHLORIDE 0.9 % IR SOLN
Status: DC | PRN
  Administered 2022-09-28: 5000 mL

## 2022-09-28 MED ORDER — OXYCODONE HCL 5 MG PO TABS: 5.0000 mg | ORAL_TABLET | Freq: Once | ORAL | Status: DC | PRN

## 2022-09-28 MED ORDER — ACETAMINOPHEN 500 MG PO TABS
1000.0000 mg | ORAL_TABLET | ORAL | Status: AC
  Administered 2022-09-28: 1000 mg via ORAL

## 2022-09-28 MED ORDER — LIDOCAINE HCL (CARDIAC) PF 100 MG/5ML IV SOSY
PREFILLED_SYRINGE | INTRAVENOUS | Status: DC | PRN
  Administered 2022-09-28: 40 mg via INTRAVENOUS

## 2022-09-28 MED ORDER — POVIDONE-IODINE 10 % EX SWAB: 2.0000 | Freq: Once | CUTANEOUS | Status: DC

## 2022-09-28 SURGICAL SUPPLY — 22 items
CATH ROBINSON RED A/P 16FR (CATHETERS) ×1 IMPLANT
DEVICE MYOSURE LITE (MISCELLANEOUS) IMPLANT
DEVICE MYOSURE REACH (MISCELLANEOUS) IMPLANT
DRSG TELFA 3X8 NADH STRL (GAUZE/BANDAGES/DRESSINGS) ×1 IMPLANT
ELECT REM PT RETURN 9FT ADLT (ELECTROSURGICAL)
ELECTRODE REM PT RTRN 9FT ADLT (ELECTROSURGICAL) IMPLANT
GAUZE 4X4 16PLY ~~LOC~~+RFID DBL (SPONGE) ×1 IMPLANT
GLOVE BIOGEL M 6.5 STRL (GLOVE) ×2 IMPLANT
GLOVE BIOGEL M 7.0 STRL (GLOVE) ×1 IMPLANT
GLOVE BIOGEL PI IND STRL 6.5 (GLOVE) ×2 IMPLANT
GLOVE BIOGEL PI IND STRL 7.0 (GLOVE) ×1 IMPLANT
GOWN STRL REUS W/TWL LRG LVL3 (GOWN DISPOSABLE) ×2 IMPLANT
KIT PROCEDURE FLUENT (KITS) ×1 IMPLANT
KIT TURNOVER CYSTO (KITS) ×1 IMPLANT
PACK VAGINAL MINOR WOMEN LF (CUSTOM PROCEDURE TRAY) ×1 IMPLANT
PAD OB MATERNITY 4.3X12.25 (PERSONAL CARE ITEMS) ×1 IMPLANT
SEAL ROD LENS SCOPE MYOSURE (ABLATOR) ×1 IMPLANT
SET GENESYS HTA PROCERVA (MISCELLANEOUS) IMPLANT
SOL PREP POV-IOD 4OZ 10% (MISCELLANEOUS) IMPLANT
TOWEL OR 17X24 6PK STRL BLUE (TOWEL DISPOSABLE) IMPLANT
TOWEL OR 17X26 10 PK STRL BLUE (TOWEL DISPOSABLE) ×2 IMPLANT
UNDERPAD 30X36 HEAVY ABSORB (UNDERPADS AND DIAPERS) ×1 IMPLANT

## 2022-09-28 NOTE — Discharge Instructions (Signed)

## 2022-09-28 NOTE — Anesthesia Preprocedure Evaluation (Addendum)
Anesthesia Evaluation  Patient identified by MRN, date of birth, ID band Patient awake    Reviewed: Allergy & Precautions, NPO status , Patient's Chart, lab work & pertinent test results  Airway Mallampati: II  TM Distance: >3 FB Neck ROM: Full    Dental  (+) Teeth Intact   Pulmonary former smoker   breath sounds clear to auscultation       Cardiovascular negative cardio ROS  Rhythm:Regular Rate:Normal     Neuro/Psych  Headaches  negative psych ROS   GI/Hepatic Neg liver ROS,GERD  ,,  Endo/Other  negative endocrine ROS    Renal/GU negative Renal ROS     Musculoskeletal negative musculoskeletal ROS (+)    Abdominal   Peds  Hematology negative hematology ROS (+)   Anesthesia Other Findings   Reproductive/Obstetrics                             Anesthesia Physical Anesthesia Plan  ASA: 2  Anesthesia Plan: General   Post-op Pain Management:    Induction: Intravenous  PONV Risk Score and Plan: 4 or greater and Ondansetron, Dexamethasone, Midazolam and Scopolamine patch - Pre-op  Airway Management Planned: LMA  Additional Equipment: None  Intra-op Plan:   Post-operative Plan: Extubation in OR  Informed Consent: I have reviewed the patients History and Physical, chart, labs and discussed the procedure including the risks, benefits and alternatives for the proposed anesthesia with the patient or authorized representative who has indicated his/her understanding and acceptance.     Dental advisory given  Plan Discussed with: CRNA  Anesthesia Plan Comments:        Anesthesia Quick Evaluation

## 2022-09-28 NOTE — Anesthesia Procedure Notes (Signed)
Procedure Name: LMA Insertion Date/Time: 09/28/2022 11:44 AM  Performed by: Georgeanne Nim, CRNAPre-anesthesia Checklist: Patient identified, Emergency Drugs available, Suction available and Patient being monitored Patient Re-evaluated:Patient Re-evaluated prior to induction Oxygen Delivery Method: Circle system utilized Preoxygenation: Pre-oxygenation with 100% oxygen Induction Type: IV induction Ventilation: Mask ventilation without difficulty LMA: LMA inserted LMA Size: 4.0 Number of attempts: 1 Placement Confirmation: positive ETCO2, CO2 detector and breath sounds checked- equal and bilateral Tube secured with: Tape Dental Injury: Teeth and Oropharynx as per pre-operative assessment

## 2022-09-28 NOTE — Anesthesia Postprocedure Evaluation (Signed)
Anesthesia Post Note  Patient: Kim Armstrong  Procedure(s) Performed: DILATATION & CURETTAGE/HYSTEROSCOPY WITH MYOSURE (Vagina ) DILATATION & CURETTAGE/HYSTEROSCOPY WITH HYDROTHERMAL ABLATION (Vagina )     Patient location during evaluation: PACU Anesthesia Type: General Level of consciousness: awake and alert Pain management: pain level controlled Vital Signs Assessment: post-procedure vital signs reviewed and stable Respiratory status: spontaneous breathing, nonlabored ventilation, respiratory function stable and patient connected to nasal cannula oxygen Cardiovascular status: blood pressure returned to baseline and stable Postop Assessment: no apparent nausea or vomiting Anesthetic complications: no   No notable events documented.  Last Vitals:  Vitals:   09/28/22 1332 09/28/22 1400  BP: 136/88 (!) 136/98  Pulse: 70 73  Resp: 13 14  Temp: 36.6 C   SpO2: 96% 100%    Last Pain:  Vitals:   09/28/22 1400  TempSrc:   PainSc: 0-No pain                 Effie Berkshire

## 2022-09-28 NOTE — Transfer of Care (Signed)
Immediate Anesthesia Transfer of Care Note  Patient: Unknown Foley  Procedure(s) Performed: DILATATION & CURETTAGE/HYSTEROSCOPY WITH MYOSURE (Vagina ) DILATATION & CURETTAGE/HYSTEROSCOPY WITH HYDROTHERMAL ABLATION (Vagina )  Patient Location: PACU  Anesthesia Type:General  Level of Consciousness: awake, alert , oriented, and patient cooperative  Airway & Oxygen Therapy: Patient Spontanous Breathing  Post-op Assessment: Report given to RN and Post -op Vital signs reviewed and stable  Post vital signs: Reviewed and stable  Last Vitals:  Vitals Value Taken Time  BP    Temp    Pulse 83 09/28/22 1243  Resp    SpO2 97 % 09/28/22 1243  Vitals shown include unvalidated device data.  Last Pain:  Vitals:   09/28/22 0934  TempSrc: Oral  PainSc: 3       Patients Stated Pain Goal: 4 (94/76/54 [id number])  Complications: No notable events documented.

## 2022-09-28 NOTE — Op Note (Signed)
09/28/2022  1:00 PM  PATIENT:  Kim Armstrong  46 y.o. female  PRE-OPERATIVE DIAGNOSIS:  Menorrhagia with Regular Cycles Endometrial Mass  POST-OPERATIVE DIAGNOSIS:  Menorrhagia with Regular CyclesEndometrial Mass  PROCEDURE:  Procedure(s): DILATATION & CURETTAGE/HYSTEROSCOPY WITH MYOSURE (N/A) DILATATION & CURETTAGE/HYSTEROSCOPY WITH HYDROTHERMAL ABLATION (N/A)  SURGEON:  Surgeon(s) and Role:    Christophe Louis, MD - Primary  PHYSICIAN ASSISTANT:   ASSISTANTS: none   ANESTHESIA:   general  EBL:  10 cc    BLOOD ADMINISTERED:none  DRAINS: none   LOCAL MEDICATIONS USED:  MARCAINE     SPECIMEN:  Source of Specimen:  endometrial currettings and polyp   DISPOSITION OF SPECIMEN:  PATHOLOGY  COUNTS:  YES  TOURNIQUET:  * No tourniquets in log *  DICTATION: .Note written in EPIC  PLAN OF CARE: Discharge to home after PACU  PATIENT DISPOSITION:  PACU - hemodynamically stable.   Delay start of Pharmacological VTE agent (>24hrs) due to surgical blood loss or risk of bleeding: not applicable  Findings : Normal appearing external genitalia. Normal appearing cervix. Proliferative endometrium.Marland Kitchen essure seen at the left tubal ostia. Endometrial polyps seen.   Procedure: Patient was taken to the operating room #3 at [facility] where she was placed under general anesthesia. She was placed in the dorsal lithotomy position. She was prepped and draped in the usual sterile fashion. A speculum was placed into the vaginal vault. The anterior lip of the cervix was grasped with a single-tooth tenaculum. Quarter percent Marcaine was injected at the 4 and 8:00 positions of the cervix. The cervix was then sounded to 8 cm. The cervix was dilated to approximately 6 mm. Mysosure operative  hysteroscope was inserted. The findings noted above. Myosure reach  blade was introduced through the hysteroscope. The endometrial mass was removed in 5 minute.  There was no evidence of perforation.   The  hydrothermal hysteroscope was inserted. The ostia were visualized bilaterally. There was no evidence of perforation. The hydrothermal ablation was performed for 10 minutes.  The hysteroscope was removed. 10 cc of 25% marcaine was injected at the 4 and 8 oclock position.  The single tooth tenaculum was removed.  Excellent hemostasis was noted.   Sponge lap and needle counts were correct x 2.  The patient was awakened from anesthesia and taken to the recovery room in stable condition.

## 2022-09-28 NOTE — H&P (Signed)
Date of Initial H&P: 09/27/2022  History reviewed, patient examined, no change in status, stable for surgery.

## 2022-09-30 LAB — SURGICAL PATHOLOGY

## 2022-10-03 ENCOUNTER — Encounter (HOSPITAL_BASED_OUTPATIENT_CLINIC_OR_DEPARTMENT_OTHER): Payer: Self-pay | Admitting: Obstetrics and Gynecology

## 2023-01-03 ENCOUNTER — Ambulatory Visit (INDEPENDENT_AMBULATORY_CARE_PROVIDER_SITE_OTHER): Payer: 59 | Admitting: Internal Medicine

## 2023-01-03 ENCOUNTER — Encounter: Payer: Self-pay | Admitting: Internal Medicine

## 2023-01-03 VITALS — BP 110/80 | HR 95 | Temp 98.6°F | Wt 188.2 lb

## 2023-01-03 DIAGNOSIS — M545 Low back pain, unspecified: Secondary | ICD-10-CM

## 2023-01-03 MED ORDER — CYCLOBENZAPRINE HCL 5 MG PO TABS: 5.0000 mg | ORAL_TABLET | Freq: Every evening | ORAL | 1 refills | Status: DC | PRN

## 2023-01-03 MED ORDER — MELOXICAM 7.5 MG PO TABS: 7.5000 mg | ORAL_TABLET | Freq: Every day | ORAL | 0 refills | Status: DC

## 2023-01-03 NOTE — Progress Notes (Signed)
Established Patient Office Visit     CC/Reason for Visit: Right lower back pain  HPI: Kim Armstrong is a 47 y.o. female who is coming in today for the above mentioned reasons.  Over the weekend she was moving out of a storage unit.  Ever since then she has had right lower back pain.  No urinary symptoms, no saddle anesthesia, no bowel or bladder incontinence, no radicular apathy.  This is her third episode in 18 months.  She has been taking some leftover Flexeril and meloxicam that have been helpful but she needs refills.   Past Medical/Surgical History: Past Medical History:  Diagnosis Date   Endometrial mass    GERD (gastroesophageal reflux disease)    History of chemotherapy    09-19-2014  to 11/ 2016  left breast cancer   History of external beam radiation therapy    03-10-2015  to 04-27-2015  left breast cancer   Malignant neoplasm of upper-outer quadrant of left breast in female, estrogen receptor positive ([place]) 08/2014   oncologist--- dr Lindi Adie;   dx 10/ 2015;   Stage IIA,  chemo 09-19-2014  to 11/ 2016 ;   01-29-2015 left breast lumpectomy w/ sln bx;   IMRT completed 04-27-2015   Menorrhagia    Migraine without aura and without status migrainosus, not intractable    neurologist--  ladonna cook NP    Past Surgical History:  Procedure Laterality Date   BREAST LUMPECTOMY WITH AXILLARY LYMPH NODE BIOPSY Left 01/29/2015   DILATATION & CURETTAGE/HYSTEROSCOPY WITH MYOSURE N/A 09/28/2022   Procedure: DILATATION & CURETTAGE/HYSTEROSCOPY WITH MYOSURE;  Surgeon: Christophe Louis, MD;  Location: [facility];  Service: Gynecology;  Laterality: N/A;   DILITATION & CURRETTAGE/HYSTROSCOPY WITH HYDROTHERMAL ABLATION N/A 09/28/2022   Procedure: DILATATION & CURETTAGE/HYSTEROSCOPY WITH HYDROTHERMAL ABLATION;  Surgeon: Christophe Louis, MD;  Location: [facility];  Service: Gynecology;  Laterality: N/A;   ECTOPIC PREGNANCY SURGERY  05/2009   '@[facility]'$ ;     laparoscopic turned laparotomy w/ right salpingectomy    Social History:  reports that she quit smoking about 27 years ago. Her smoking use included cigarettes. She has a 2.00 pack-year smoking history. She has never used smokeless tobacco. She reports current alcohol use. She reports that she does not use drugs.  Allergies: No Known Allergies  Family History:  Family History  Problem Relation Age of Onset   Breast cancer Mother 33       unilateral   Colon cancer Father 37   Cancer Maternal Aunt 60       unknown type of cancer   Cancer Maternal Grandmother 2       cancer - possibly pancreatic cancer or GI cancer   Colon polyps Neg Hx    Esophageal cancer Neg Hx    Stomach cancer Neg Hx      Current Outpatient Medications:    acetaminophen (TYLENOL) 500 MG tablet, Take 1,000 mg by mouth every 6 (six) hours as needed for mild pain or moderate pain., Disp: , Rfl:    calcium carbonate (TUMS - DOSED IN MG ELEMENTAL CALCIUM) 500 MG chewable tablet, Chew 1 tablet by mouth as needed for indigestion or heartburn., Disp: , Rfl:    cyclobenzaprine (FLEXERIL) 5 MG tablet, Take 1 tablet (5 mg total) by mouth at bedtime as needed for muscle spasms., Disp: 30 tablet, Rfl: 1   ibuprofen (ADVIL) 800 MG tablet, Take 1 tablet (800 mg total) by mouth every 8 (eight) hours  as needed., Disp: 30 tablet, Rfl: 0   meloxicam (MOBIC) 7.5 MG tablet, Take 1 tablet (7.5 mg total) by mouth daily., Disp: 30 tablet, Rfl: 0   ondansetron (ZOFRAN ODT) 4 MG disintegrating tablet, Take 1 tablet (4 mg total) by mouth every 8 (eight) hours as needed for nausea or vomiting., Disp: 20 tablet, Rfl: 0   rizatriptan (MAXALT) 10 MG tablet, Take 10 mg by mouth as needed for migraine. May repeat in 2 hours if needed, Disp: , Rfl:    Ubrogepant (UBRELVY) 100 MG TABS, Take by mouth as needed., Disp: , Rfl:   Review of Systems:  Negative unless indicated in HPI.   Physical Exam: Vitals:   01/03/23 1000  BP: 110/80  Pulse:  95  Temp: 98.6 F (37 C)  TempSrc: Oral  SpO2: 96%  Weight: 188 lb 3.2 oz (85.4 kg)    Body mass index is 29.48 kg/m.   Physical Exam Vitals reviewed.  Constitutional:      Appearance: Normal appearance.  HENT:     Head: Normocephalic and atraumatic.  Eyes:     Conjunctiva/sclera: Conjunctivae normal.     Pupils: Pupils are equal, round, and reactive to light.  Cardiovascular:     Rate and Rhythm: Normal rate and regular rhythm.  Pulmonary:     Effort: Pulmonary effort is normal.     Breath sounds: Normal breath sounds.  Skin:    General: Skin is warm and dry.  Neurological:     General: No focal deficit present.     Mental Status: She is alert and oriented to person, place, and time.  Psychiatric:        Mood and Affect: Mood normal.        Behavior: Behavior normal.        Thought Content: Thought content normal.        Judgment: Judgment normal.      Impression and Plan:  Acute right-sided low back pain without sciatica - Plan: meloxicam (MOBIC) 7.5 MG tablet, cyclobenzaprine (FLEXERIL) 5 MG tablet, Ambulatory referral to Physical Therapy  -Iced icing, back stretches, Flexeril and meloxicam have been sent.  Because she has had at least 3 episodes I think it is prudent to refer to physical therapy.  She agrees.  Time spent:31 minutes reviewing chart, interviewing and examining patient and formulating plan of care.     Lelon Frohlich, MD [facility]

## 2023-01-04 NOTE — Therapy (Signed)
OUTPATIENT PHYSICAL THERAPY THORACOLUMBAR EVALUATION   Patient Name: Kim Armstrong MRN: [id number] DOB:Jul 25, 1976, 47 y.o., female Today's Date: 01/05/2023  END OF SESSION:   Past Medical History:  Diagnosis Date   Endometrial mass    GERD (gastroesophageal reflux disease)    History of chemotherapy    09-19-2014  to 11/ 2016  left breast cancer   History of external beam radiation therapy    03-10-2015  to 04-27-2015  left breast cancer   Malignant neoplasm of upper-outer quadrant of left breast in female, estrogen receptor positive ([place]) 08/2014   oncologist--- dr Lindi Adie;   dx 10/ 2015;   Stage IIA,  chemo 09-19-2014  to 11/ 2016 ;   01-29-2015 left breast lumpectomy w/ sln bx;   IMRT completed 04-27-2015   Menorrhagia    Migraine without aura and without status migrainosus, not intractable    neurologist--  Ashok Pall NP   Past Surgical History:  Procedure Laterality Date   BREAST LUMPECTOMY WITH AXILLARY LYMPH NODE BIOPSY Left 01/29/2015   DILATATION & CURETTAGE/HYSTEROSCOPY WITH MYOSURE N/A 09/28/2022   Procedure: DILATATION & CURETTAGE/HYSTEROSCOPY WITH MYOSURE;  Surgeon: Christophe Louis, MD;  Location: [facility];  Service: Gynecology;  Laterality: N/A;   DILITATION & CURRETTAGE/HYSTROSCOPY WITH HYDROTHERMAL ABLATION N/A 09/28/2022   Procedure: DILATATION & CURETTAGE/HYSTEROSCOPY WITH HYDROTHERMAL ABLATION;  Surgeon: Christophe Louis, MD;  Location: [facility];  Service: Gynecology;  Laterality: N/A;   ECTOPIC PREGNANCY SURGERY  05/2009   '@[facility]'$ ;    laparoscopic turned laparotomy w/ right salpingectomy   Patient Active Problem List   Diagnosis Date Noted   Migraines 07/31/2020   Genetic testing - OvaNext gene panel 10/09/2014   Breast cancer of upper-outer quadrant of left female breast ([place]) 08/22/2014    PCP: Isaac Bliss, Olam Idler, MD  REFERRING PROVIDER: Isaac Bliss, Olam Idler, MD  REFERRING DIAG: M54.50 (ICD-10-CM) - Acute  right-sided low back pain without sciatica   Rationale for Evaluation and Treatment: Rehabilitation  THERAPY DIAG:  Other low back pain  Cramp and spasm  ONSET DATE: 2 weeks ago (woke up with pain)  SUBJECTIVE:                                                                                                                                                                                           SUBJECTIVE STATEMENT: Pt reports that she "threw out her back" 2 weeks ago.  MD prescribed meloxicam and Flexeril.    Pt is a 47 y.o. female who is coming in today for the Rt sided LBP.  She states that  over the weekend she was moving out  of a storage unit.  Ever since then she has had right lower back pain.   This is her third episode in 18 months.  She has been taking some leftover Flexeril and meloxicam that have been helpful but she needs refills.   PERTINENT HISTORY:  Lt breast cancer 2015 with lymph nodes, migraines  PAIN:  Are you having pain? Yes: NPRS scale: 2-8/10 Pain location: Rt side of low back  Pain description: sore, dull, sometimes sharp Aggravating factors: standing too long, sitting at work for long periods, walking  Relieving factors: supine, heat, meds  PRECAUTIONS: Other: history of breast cancer   WEIGHT BEARING RESTRICTIONS: No  FALLS:  Has patient fallen in last 6 months? 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  PLOF: Independent, Vocation/Vocational requirements: desk work, and Leisure: pilates, walk dog  PATIENT GOALS: reduce LBP, prevent recurrance  NEXT MD VISIT: as needed   OBJECTIVE:   DIAGNOSTIC FINDINGS:  None   PATIENT SURVEYS:  FOTO 49 (goal is 65)  SCREENING FOR RED FLAGS: Bowel or bladder incontinence: No Spinal tumors: No Cauda equina syndrome: No Compression fracture: No Abdominal aneurysm: No  COGNITION: Overall cognitive status: Within functional  limits for tasks assessed     SENSATION: WFL  MUSCLE LENGTH: WFLs  POSTURE: weight shift left  PALPATION: Pt with reduced segmental mobility in the lumbar spine without pain.  Tension over Rt lumbar paraspinals and QL.   LUMBAR ROM:   WFLs in all directions with pain on the Rt with extension and Rt sidebending   LOWER EXTREMITY ROM:    WFLs without pain  LOWER EXTREMITY MMT:    Bil knees 5/5, hip abduction 4+/5, extension 4+/5.  LUMBAR SPECIAL TESTS:  Slump test: Negative  GAIT: Distance walked: 50 Assistive device utilized: None Level of assistance: Complete Independence Comments: WFLs  TODAY'S TREATMENT:                                                                                                                              DATE: 01/05/23  HEP established- see below   PATIENT EDUCATION:  Education details: Access Code: [email or web address], DN handout  Person educated: Patient Education method: Explanation, Demonstration, and Handouts Education comprehension: verbalized understanding and returned demonstration  HOME EXERCISE PROGRAM: Access Code: [email or web address] URL: [email or web address] Date: 01/05/2023 Prepared by: Claiborne Billings  Exercises - Supine Piriformis Stretch  - 2-3 x daily - 7 x weekly - 1 sets - 3 reps - 20 hold - Supine Lower Trunk Rotation  - 2-3 x daily - 7 x weekly - 1 sets - 3 reps - 20 hold - Seated Hamstring Stretch  - 2-3 x daily - 7 x weekly - 1 sets - 3 reps - 20 hold - Seated Figure 4 Piriformis Stretch  - 3 x daily - 7 x weekly - 1 sets - 3 reps - 20 hold - Child's Pose  with Sidebending  - 2 x daily - 7 x weekly - 1 sets - 3 reps - 20 hold  ASSESSMENT:  CLINICAL IMPRESSION: Patient is a 47 y.o. female who was seen today for physical therapy evaluation and treatment for Rt sided LBP that began 2 weeks ago.  Pt reports that she has had similar issue in the past and would like to prevent further recurrence.  Pt reports 2-8/10 Rt sided LBP that  is worse with prolonged standing or sitting.  Pt with tension and reduced mobility in the lumbar spine Rt>Lt.  Trigger points in Rt quadratus and lumbar paraspinals.  Pt is active in Pilates and would benefit from more daily strength for core in addition to flexibility.  Patient will benefit from skilled PT to address the below impairments and improve overall function.    OBJECTIVE IMPAIRMENTS: increased muscle spasms, postural dysfunction, and pain.   ACTIVITY LIMITATIONS: sitting, standing, and locomotion level  PARTICIPATION LIMITATIONS: meal prep, cleaning, and occupation  PERSONAL FACTORS: 1 comorbidity: recurrence of LBP  are also affecting patient's functional outcome.   REHAB POTENTIAL: Excellent  CLINICAL DECISION MAKING: Stable/uncomplicated  EVALUATION COMPLEXITY: Low   GOALS: Goals reviewed with patient? Yes  SHORT TERM GOALS: Target date: 02/02/2023    Be independent in initial HEP Baseline: Goal status: INITIAL  2.  Report > or = to 30% reduction in LBP with work tasks  Baseline: 2-8/10 Goal status: INITIAL  3.  Verbalize and demonstrate body mechanics modifications for lumbar protection with daily tasks  Baseline:  Goal status: INITIAL   LONG TERM GOALS: Target date: 03/02/2023    Be independent in advanced HEP Baseline:  Goal status: INITIAL  2.  Improve FOTO to > or = to 70 Baseline: 49 Goal status: INITIAL  3.  Report > or = to 70% reduction in LBP with daily tasks Baseline:  Goal status: INITIAL  4.  Sit for work without limitation due to LBP Baseline:  Goal status: INITIAL  5.  Report frequent change of position with work tasks to improve muscular balance  Baseline:  Goal status: INITIAL   PLAN:  PT FREQUENCY: 1-2x/week  PT DURATION: 8 weeks  PLANNED INTERVENTIONS: Therapeutic exercises, Therapeutic activity, Neuromuscular re-education, Balance training, Gait training, Patient/Family education, Self Care, Joint mobilization, Aquatic  Therapy, Dry Needling, Electrical stimulation, Spinal mobilization, Cryotherapy, Moist heat, Taping, Traction, Manual therapy, and Re-evaluation.  PLAN FOR NEXT SESSION: DN to bil lumbar multifidi, Rt glutes and QL, body mechanics education, review HEP, add core strength  Sigurd Sos, PT 01/05/23 8:41 AM

## 2023-01-05 ENCOUNTER — Other Ambulatory Visit: Payer: Self-pay

## 2023-01-05 ENCOUNTER — Ambulatory Visit: Payer: 59 | Attending: Internal Medicine

## 2023-01-05 DIAGNOSIS — R252 Cramp and spasm: Secondary | ICD-10-CM | POA: Insufficient documentation

## 2023-01-05 DIAGNOSIS — M545 Low back pain, unspecified: Secondary | ICD-10-CM | POA: Diagnosis not present

## 2023-01-05 DIAGNOSIS — M5459 Other low back pain: Secondary | ICD-10-CM | POA: Diagnosis present

## 2023-01-05 NOTE — Patient Instructions (Signed)

## 2023-01-10 ENCOUNTER — Ambulatory Visit: Payer: 59 | Attending: Internal Medicine | Admitting: Physical Therapy

## 2023-01-10 ENCOUNTER — Encounter: Payer: Self-pay | Admitting: Physical Therapy

## 2023-01-10 DIAGNOSIS — M5459 Other low back pain: Secondary | ICD-10-CM

## 2023-01-10 DIAGNOSIS — R252 Cramp and spasm: Secondary | ICD-10-CM | POA: Diagnosis present

## 2023-01-10 DIAGNOSIS — M6281 Muscle weakness (generalized): Secondary | ICD-10-CM

## 2023-01-10 NOTE — Therapy (Signed)
OUTPATIENT PHYSICAL THERAPY TREATMENT NOTE   Patient Name: Kim Armstrong MRN: [id number] DOB:Jun 06, 1976, 47 y.o., female Today's Date: 01/10/2023  PCP: Isaac Bliss, Olam Idler, MD REFERRING PROVIDER: Isaac Bliss, Olam Idler, MD  END OF SESSION:   PT End of Session - 01/10/23 0804     Visit Number 2    Date for PT Re-Evaluation 03/02/23    Authorization Type [employer]    PT Start Time 0803    PT Stop Time 0845    PT Time Calculation (min) 42 min    Activity Tolerance Patient tolerated treatment well    Behavior During Therapy [facility] for tasks assessed/performed             Past Medical History:  Diagnosis Date   Endometrial mass    GERD (gastroesophageal reflux disease)    History of chemotherapy    09-19-2014  to 11/ 2016  left breast cancer   History of external beam radiation therapy    03-10-2015  to 04-27-2015  left breast cancer   Malignant neoplasm of upper-outer quadrant of left breast in female, estrogen receptor positive ([place]) 08/2014   oncologist--- dr Lindi Adie;   dx 10/ 2015;   Stage IIA,  chemo 09-19-2014  to 11/ 2016 ;   01-29-2015 left breast lumpectomy w/ sln bx;   IMRT completed 04-27-2015   Menorrhagia    Migraine without aura and without status migrainosus, not intractable    neurologist--  Ashok Pall NP   Past Surgical History:  Procedure Laterality Date   BREAST LUMPECTOMY WITH AXILLARY LYMPH NODE BIOPSY Left 01/29/2015   DILATATION & CURETTAGE/HYSTEROSCOPY WITH MYOSURE N/A 09/28/2022   Procedure: DILATATION & CURETTAGE/HYSTEROSCOPY WITH MYOSURE;  Surgeon: Christophe Louis, MD;  Location: [facility];  Service: Gynecology;  Laterality: N/A;   DILITATION & CURRETTAGE/HYSTROSCOPY WITH HYDROTHERMAL ABLATION N/A 09/28/2022   Procedure: DILATATION & CURETTAGE/HYSTEROSCOPY WITH HYDROTHERMAL ABLATION;  Surgeon: Christophe Louis, MD;  Location: [facility];  Service: Gynecology;  Laterality: N/A;   ECTOPIC PREGNANCY SURGERY  05/2009    '@[facility]'$ ;    laparoscopic turned laparotomy w/ right salpingectomy   Patient Active Problem List   Diagnosis Date Noted   Migraines 07/31/2020   Genetic testing - OvaNext gene panel 10/09/2014   Breast cancer of upper-outer quadrant of left female breast ([place]) 08/22/2014    REFERRING DIAG: M54.50 (ICD-10-CM) - Acute right-sided low back pain without sciatica   THERAPY DIAG:  Other low back pain  Cramp and spasm  Muscle weakness (generalized)  Rationale for Evaluation and Treatment Rehabilitation  PERTINENT HISTORY: Lt breast cancer 2015 with lymph nodes, migraines  PRECAUTIONS: Other: history of breast cancer    WEIGHT BEARING RESTRICTIONS: No   FALLS:  Has patient fallen in last 6 months? 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     PAIN:  Are you having pain? Yes: NPRS scale: 2/10 Pain location: Rt side of low back  Pain description: sore, dull, sometimes sharp Aggravating factors: standing too long, sitting at work for long periods, walking  Relieving factors: supine, heat, meds   OBJECTIVE: (objective measures completed at initial evaluation unless otherwise dated)   DIAGNOSTIC FINDINGS:  None    PATIENT SURVEYS:  FOTO 49 (goal is 78)   SCREENING FOR RED FLAGS: Bowel or bladder incontinence: No Spinal tumors: No Cauda equina syndrome: No Compression fracture: No Abdominal aneurysm: No   COGNITION: Overall cognitive status: Within functional limits for tasks  assessed                          SENSATION: WFL   MUSCLE LENGTH: WFLs   POSTURE: weight shift left   PALPATION: Pt with reduced segmental mobility in the lumbar spine without pain.  Tension over Rt lumbar paraspinals and QL.    LUMBAR ROM:    WFLs in all directions with pain on the Rt with extension and Rt sidebending    LOWER EXTREMITY ROM:    WFLs without pain   LOWER EXTREMITY MMT:     Bil knees 5/5, hip abduction 4+/5, extension 4+/5.   LUMBAR SPECIAL TESTS:  Slump test: Negative   GAIT: Distance walked: 50 Assistive device utilized: None Level of assistance: Complete Independence Comments: WFLs   TODAY'S TREATMENT:                                                                                                                              DATE:   01/10/23 UBE L3 2x2 PT present to discuss plan for session Seated hamstring stretch bil 1x30" Seated fig 4 1x30" bil Lower trunk rotation using legs crossed for overpressure 1x30" bil Supine piriformis stretch 1x30" bil Supine dying bug - very challenging to keep lumbar spine imprinted into table  Bird dog with opp arm and leg extend and then crunch elbow to knee Trigger Point Dry-Needling  Treatment instructions: Expect mild to moderate muscle soreness. S/S of pneumothorax if dry needled over a lung field, and to seek immediate medical attention should they occur. Patient verbalized understanding of these instructions and education.  Patient Consent Given: Yes Education handout provided: Previously provided Muscles treated: bil lumbar multifidi Electrical stimulation performed: No Parameters: N/A Treatment response/outcome: twich/cramp and elongation Manual therapy: prone STM lumbar after DN Updated HEP to include core  01/05/23  HEP established- see below    PATIENT EDUCATION:  Education details: Access Code: [email or web address], DN handout  Person educated: Patient Education method: Explanation, Demonstration, and  Handouts Education comprehension: verbalized understanding and returned demonstration   HOME EXERCISE PROGRAM: Access Code: [email or web address] URL: [email or web address] Date: 01/10/2023 Prepared by: Venetia Night Nathaneil Feagans  Exercises - Supine Piriformis Stretch  - 2-3 x daily -  7 x weekly - 1 sets - 3 reps - 20 hold - Supine Lower Trunk Rotation  - 2-3 x daily - 7 x weekly - 1 sets - 3 reps - 20 hold - Seated Hamstring Stretch  - 2-3 x daily - 7 x weekly - 1 sets - 3 reps - 20 hold - Seated Figure 4 Piriformis Stretch  - 3 x daily - 7 x weekly - 1 sets - 3 reps - 20 hold - Child's Pose with Sidebending  - 2 x daily - 7 x weekly - 1 sets - 3 reps - 20 hold - Dead Bug  - 1 x daily - 7 x weekly - 2 sets - 10 reps - Isometric Dead Bug  - 1 x daily - 7 x weekly - 1 sets - 5 reps - 5-10 sec hold - Bird Dog with Knee Taps  - 1 x daily - 7 x weekly - 2 sets - 10 reps   ASSESSMENT:   CLINICAL IMPRESSION: Pt has been compliant with initial HEP.  Progressed HEP to include core today.  Pt is greatly challenged with abdominal indraw with overlay of LE movement especially with supine dying bug.  DN performed to bil lumbar spine multifidi with signif twitch/cramp and elongation.  Pt will benefit from considering private pilates to ensure form before returning to reformer classes given flare ups x2 with this type of exercise.   OBJECTIVE IMPAIRMENTS: increased muscle spasms, postural dysfunction, and pain.    ACTIVITY LIMITATIONS: sitting, standing, and locomotion level   PARTICIPATION LIMITATIONS: meal prep, cleaning, and occupation   PERSONAL FACTORS: 1 comorbidity: recurrence of LBP  are also affecting patient's functional outcome.    REHAB POTENTIAL: Excellent   CLINICAL DECISION MAKING: Stable/uncomplicated   EVALUATION COMPLEXITY: Low     GOALS: Goals reviewed with patient? Yes   SHORT TERM GOALS: Target date: 02/02/2023     Be independent in initial HEP Baseline: Goal status:  INITIAL   2.  Report > or = to 30% reduction in LBP with work tasks  Baseline: 2-8/10 Goal status: INITIAL   3.  Verbalize and demonstrate body mechanics modifications for lumbar protection with daily tasks  Baseline:  Goal status: INITIAL     LONG TERM GOALS: Target date: 03/02/2023     Be independent in advanced HEP Baseline:  Goal status: INITIAL   2.  Improve FOTO to > or = to 70 Baseline: 49 Goal status: INITIAL   3.  Report > or = to 70% reduction in LBP with daily tasks Baseline:  Goal status: INITIAL   4.  Sit for work without limitation due to LBP Baseline:  Goal status: INITIAL   5.  Report frequent change of position with work tasks to improve muscular balance  Baseline:  Goal status: INITIAL     PLAN:   PT FREQUENCY: 1-2x/week   PT DURATION: 8 weeks   PLANNED INTERVENTIONS: Therapeutic exercises, Therapeutic activity, Neuromuscular re-education, Balance training, Gait training, Patient/Family education, Self Care, Joint mobilization, Aquatic Therapy, Dry Needling, Electrical stimulation, Spinal mobilization, Cryotherapy, Moist heat, Taping, Traction, Manual therapy, and Re-evaluation.   PLAN FOR NEXT SESSION: f/u on DN#1 to bil lumbar multifidi, core progression, body mechanics education, review HEP, add core strength, give info for private pilates contact     [place], PT 01/10/2023, 8:47 AM

## 2023-01-11 NOTE — Therapy (Signed)
OUTPATIENT PHYSICAL THERAPY TREATMENT NOTE   Patient Name: Kim Armstrong MRN: [id number] DOB:11/28/1975, 47 y.o., female Today's Date: 01/12/2023  PCP: Isaac Bliss, Olam Idler, MD REFERRING PROVIDER: Isaac Bliss, Olam Idler, MD  END OF SESSION:   PT End of Session - 01/12/23 0755     Visit Number 3    Date for PT Re-Evaluation 03/02/23    Authorization Type Aet[employer]    PT Start Time 0756    PT Stop Time 0839    PT Time Calculation (min) 43 min    Activity Tolerance Patient tolerated treatment well    Behavior During Therapy [facility] for tasks assessed/performed              Past Medical History:  Diagnosis Date   Endometrial mass    GERD (gastroesophageal reflux disease)    History of chemotherapy    09-19-2014  to 11/ 2016  left breast cancer   History of external beam radiation therapy    03-10-2015  to 04-27-2015  left breast cancer   Malignant neoplasm of upper-outer quadrant of left breast in female, estrogen receptor positive ([place]) 08/2014   oncologist--- dr Lindi Adie;   dx 10/ 2015;   Stage IIA,  chemo 09-19-2014  to 11/ 2016 ;   01-29-2015 left breast lumpectomy w/ sln bx;   IMRT completed 04-27-2015   Menorrhagia    Migraine without aura and without status migrainosus, not intractable    neurologist--  Ashok Pall NP   Past Surgical History:  Procedure Laterality Date   BREAST LUMPECTOMY WITH AXILLARY LYMPH NODE BIOPSY Left 01/29/2015   DILATATION & CURETTAGE/HYSTEROSCOPY WITH MYOSURE N/A 09/28/2022   Procedure: DILATATION & CURETTAGE/HYSTEROSCOPY WITH MYOSURE;  Surgeon: Christophe Louis, MD;  Location: [place];  Service: Gynecology;  Laterality: N/A;   DILITATION & CURRETTAGE/HYSTROSCOPY WITH HYDROTHERMAL ABLATION N/A 09/28/2022   Procedure: DILATATION & CURETTAGE/HYSTEROSCOPY WITH HYDROTHERMAL ABLATION;  Surgeon: Christophe Louis, MD;  Location: [facility];  Service: Gynecology;  Laterality: N/A;   ECTOPIC PREGNANCY SURGERY   05/2009   '@[facility]'$ ;    laparoscopic turned laparotomy w/ right salpingectomy   Patient Active Problem List   Diagnosis Date Noted   Migraines 07/31/2020   Genetic testing - OvaNext gene panel 10/09/2014   Breast cancer of upper-outer quadrant of left female breast ([place]) 08/22/2014    REFERRING DIAG: M54.50 (ICD-10-CM) - Acute right-sided low back pain without sciatica   THERAPY DIAG:  Other low back pain  Cramp and spasm  Muscle weakness (generalized)  Rationale for Evaluation and Treatment Rehabilitation  PERTINENT HISTORY: Lt breast cancer 2015 with lymph nodes, migraines  PRECAUTIONS: Other: history of breast cancer    WEIGHT BEARING RESTRICTIONS: No   FALLS:  Has patient fallen in last 6 months? 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 I didn't notice a dramatic difference with the DN.   PAIN:  Are you having pain? Yes: NPRS scale: 0/10 Pain location: Rt side of low back  Pain description: sore, dull, sometimes sharp Aggravating factors: standing too long, sitting at work for long periods, walking  Relieving factors: supine, heat, meds   OBJECTIVE: (objective measures completed at initial evaluation unless otherwise dated)   DIAGNOSTIC FINDINGS:  None    PATIENT SURVEYS:  FOTO 49 (goal is 43)   SCREENING FOR RED FLAGS: Bowel or bladder incontinence: No Spinal tumors: No Cauda equina syndrome: No Compression fracture: No Abdominal aneurysm: No   COGNITION: Overall cognitive status:  Within functional limits for tasks assessed                          SENSATION: WFL   MUSCLE LENGTH: WFLs   POSTURE: weight shift left   PALPATION: Pt with reduced segmental mobility in the lumbar spine without pain.  Tension over Rt lumbar paraspinals and QL.    LUMBAR ROM:    WFLs in all directions with pain on the Rt with extension and Rt sidebending    LOWER EXTREMITY ROM:    WFLs without pain   LOWER EXTREMITY MMT:     Bil knees 5/5, hip abduction 4+/5, extension 4+/5.   LUMBAR SPECIAL TESTS:  Slump test: Negative   GAIT: Distance walked: 50 Assistive device utilized: None Level of assistance: Complete Independence Comments: WFLs   TODAY'S TREATMENT:                                                                                                                              DATE:   01/12/23 Nustep L6 x 5 min  PT present to discuss plan for session Seated hamstring stretch bil 1x30" Seated fig 4 1x30" bil Lower trunk rotation using legs crossed for overpressure 1x30" bil Body mechanics Lifting 10# box x 5 Functional squat x 10, then with 10# wt Mini squat with OH arms x 10 sec then arms extended x 10 Standing lunge 2x5 ea intermittent UE support Supine dying bug - 2x5 Bird dog with opp arm and leg extend and then crunch elbow to knee   01/10/23 UBE L3 2x2 PT present to discuss plan for session Seated hamstring stretch bil 1x30" Seated fig 4 1x30" bil Lower trunk rotation using legs crossed for overpressure 1x30" bil Supine piriformis stretch 1x30" bil Supine dying bug - very challenging to keep lumbar spine imprinted into table  Bird dog with opp arm and leg extend and then crunch elbow to knee Trigger Point Dry-Needling  Treatment instructions: Expect mild to moderate muscle soreness. S/S of pneumothorax if dry needled over a lung field, and to seek immediate medical attention should they occur. Patient verbalized understanding of these instructions and  education.  Patient Consent Given: Yes Education handout provided:  Previously provided Muscles treated: bil lumbar multifidi Electrical stimulation performed: No Parameters: N/A Treatment response/outcome: twich/cramp and elongation Manual therapy: prone STM lumbar after DN Updated HEP to include core  01/05/23  HEP established- see below    PATIENT EDUCATION:  Education details: Access Code: [email or web address], DN handout  Person educated: Patient Education method: Explanation, Demonstration, and Handouts Education comprehension: verbalized understanding and returned demonstration   HOME EXERCISE PROGRAM: Access Code: [email or web address] URL: [email or web address] Date: 01/12/2023 Prepared by: Almyra Free  Exercises - Supine Piriformis Stretch  - 2-3 x daily - 7 x weekly - 1 sets - 3 reps - 20 hold - Supine Lower Trunk Rotation  - 2-3 x daily - 7 x weekly - 1 sets - 3 reps - 20 hold - Seated Hamstring Stretch  - 2-3 x daily - 7 x weekly - 1 sets - 3 reps - 20 hold - Seated Figure 4 Piriformis Stretch  - 3 x daily - 7 x weekly - 1 sets - 3 reps - 20 hold - Child's Pose with Sidebending  - 2 x daily - 7 x weekly - 1 sets - 3 reps - 20 hold - Dead Bug  - 1 x daily - 7 x weekly - 2 sets - 10 reps - Isometric Dead Bug  - 1 x daily - 7 x weekly - 1 sets - 5 reps - 5-10 sec hold - Bird Dog with Knee Taps  - 1 x daily - 7 x weekly - 2 sets - 10 reps - Squat with Chair Touch  - 1 x daily - 4 x weekly - 3 sets - 10 reps - Standard Lunge  - 1 x daily - 4 x weekly - 3 sets - 10 reps  Patient Education - Posture and Body Mechanics   ASSESSMENT:   CLINICAL IMPRESSION: Body mehcanics and lifting were taught today. Audrena has weakness with squats and lunges and would benefit from ongoing strengthening here. Good response to DN. No pain reported today.   OBJECTIVE IMPAIRMENTS: increased muscle spasms, postural dysfunction, and pain.    ACTIVITY LIMITATIONS: sitting, standing, and locomotion  level   PARTICIPATION LIMITATIONS: meal prep, cleaning, and occupation   PERSONAL FACTORS: 1 comorbidity: recurrence of LBP  are also affecting patient's functional outcome.    REHAB POTENTIAL: Excellent   CLINICAL DECISION MAKING: Stable/uncomplicated   EVALUATION COMPLEXITY: Low     GOALS: Goals reviewed with patient? Yes   SHORT TERM GOALS: Target date: 02/02/2023     Be independent in initial HEP Baseline: Goal status: INITIAL   2.  Report > or = to 30% reduction in LBP with work tasks  Baseline: 2-8/10 Goal status: INITIAL   3.  Verbalize and demonstrate body mechanics modifications for lumbar protection with daily tasks  Baseline:  Goal status: INITIAL     LONG TERM GOALS: Target date: 03/02/2023     Be independent in advanced HEP Baseline:  Goal status: INITIAL   2.  Improve FOTO to > or = to 70 Baseline: 49 Goal status: INITIAL   3.  Report > or = to 70% reduction in LBP with daily tasks Baseline:  Goal status: INITIAL   4.  Sit for work without limitation due to LBP Baseline:  Goal status: INITIAL   5.  Report frequent change of position with work tasks to improve muscular balance  Baseline:  Goal status: INITIAL     PLAN:   PT FREQUENCY: 1-2x/week   PT DURATION: 8 weeks  PLANNED INTERVENTIONS: Therapeutic exercises, Therapeutic activity, Neuromuscular re-education, Balance training, Gait training, Patient/Family education, Self Care, Joint mobilization, Aquatic Therapy, Dry Needling, Electrical stimulation, Spinal mobilization, Cryotherapy, Moist heat, Taping, Traction, Manual therapy, and Re-evaluation.   PLAN FOR NEXT SESSION: continue core strength and LE functional strength;  give info for private pilates contact     [place], PT 01/12/2023, 8:42 AM

## 2023-01-12 ENCOUNTER — Encounter: Payer: Self-pay | Admitting: Physical Therapy

## 2023-01-12 ENCOUNTER — Ambulatory Visit: Payer: 59 | Admitting: Physical Therapy

## 2023-01-12 DIAGNOSIS — M5459 Other low back pain: Secondary | ICD-10-CM | POA: Diagnosis not present

## 2023-01-12 DIAGNOSIS — M6281 Muscle weakness (generalized): Secondary | ICD-10-CM

## 2023-01-12 DIAGNOSIS — R252 Cramp and spasm: Secondary | ICD-10-CM

## 2023-01-17 ENCOUNTER — Telehealth: Payer: Self-pay | Admitting: Internal Medicine

## 2023-01-17 ENCOUNTER — Ambulatory Visit: Payer: 59 | Admitting: Physical Therapy

## 2023-01-17 ENCOUNTER — Encounter: Payer: Self-pay | Admitting: Physical Therapy

## 2023-01-17 DIAGNOSIS — M5459 Other low back pain: Secondary | ICD-10-CM | POA: Diagnosis not present

## 2023-01-17 DIAGNOSIS — R252 Cramp and spasm: Secondary | ICD-10-CM

## 2023-01-17 DIAGNOSIS — M6281 Muscle weakness (generalized): Secondary | ICD-10-CM

## 2023-01-17 NOTE — Therapy (Signed)
OUTPATIENT PHYSICAL THERAPY TREATMENT NOTE   Patient Name: Kim Armstrong MRN: [id number] DOB:[birth year], 47 y.o., female Today's Date: 01/17/2023  PCP: Isaac Bliss, Olam Idler, MD REFERRING PROVIDER: Isaac Bliss, Olam Idler, MD  END OF SESSION:   PT End of Session - 01/17/23 0802     Visit Number 4    Date for PT Re-Evaluation 03/02/23    Authorization Type Aetna    PT Start Time 0802    PT Stop Time [id number]    PT Time Calculation (min) 45 min    Activity Tolerance Patient tolerated treatment well    Behavior During Therapy [facility] for tasks assessed/performed               Past Medical History:  Diagnosis Date   Endometrial mass    GERD (gastroesophageal reflux disease)    History of chemotherapy    09-19-2014  to 11/ 2016  left breast cancer   History of external beam radiation therapy    03-10-2015  to 04-27-2015  left breast cancer   Malignant neoplasm of upper-outer quadrant of left breast in female, estrogen receptor positive ([place]) 08/2014   oncologist--- dr Lindi Adie;   dx 10/ 2015;   Stage IIA,  chemo 09-19-2014  to 11/ 2016 ;   01-29-2015 left breast lumpectomy w/ sln bx;   IMRT completed 04-27-2015   Menorrhagia    Migraine without aura and without status migrainosus, not intractable    neurologist--  Ashok Pall NP   Past Surgical History:  Procedure Laterality Date   BREAST LUMPECTOMY WITH AXILLARY LYMPH NODE BIOPSY Left 01/29/2015   DILATATION & CURETTAGE/HYSTEROSCOPY WITH MYOSURE N/A 09/28/2022   Procedure: DILATATION & CURETTAGE/HYSTEROSCOPY WITH MYOSURE;  Surgeon: Christophe Louis, MD;  Location: [place];  Service: Gynecology;  Laterality: N/A;   DILITATION & CURRETTAGE/HYSTROSCOPY WITH HYDROTHERMAL ABLATION N/A 09/28/2022   Procedure: DILATATION & CURETTAGE/HYSTEROSCOPY WITH HYDROTHERMAL ABLATION;  Surgeon: Christophe Louis, MD;  Location: [facility];  Service: Gynecology;  Laterality: N/A;   ECTOPIC PREGNANCY SURGERY   05/2009   '@[facility]'$ ;    laparoscopic turned laparotomy w/ right salpingectomy   Patient Active Problem List   Diagnosis Date Noted   Migraines 07/31/2020   Genetic testing - OvaNext gene panel 10/09/2014   Breast cancer of upper-outer quadrant of left female breast ([place]) 08/22/2014    REFERRING DIAG: M54.50 (ICD-10-CM) - Acute right-sided low back pain without sciatica   THERAPY DIAG:  Other low back pain  Cramp and spasm  Muscle weakness (generalized)  Rationale for Evaluation and Treatment Rehabilitation  PERTINENT HISTORY: Lt breast cancer 2015 with lymph nodes, migraines  PRECAUTIONS: Other: history of breast cancer    WEIGHT BEARING RESTRICTIONS: No   FALLS:  Has patient fallen in last 6 months? 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  I am a little stiff this morning on Rt low back but no pain.    PAIN:  Are you having pain? Yes: NPRS scale: 0/10 Pain location: Rt side of low back  Pain description: sore, dull, sometimes sharp Aggravating factors: standing too long, sitting at work for long periods, walking  Relieving factors: supine, heat, meds   OBJECTIVE: (objective measures completed at initial evaluation unless otherwise dated)   DIAGNOSTIC FINDINGS:  None    PATIENT SURVEYS:  FOTO 49 (goal is 42)   SCREENING FOR RED FLAGS: Bowel or bladder incontinence: No Spinal tumors: No Cauda equina syndrome: No Compression fracture: No Abdominal aneurysm: No    COGNITION: Overall cognitive status: Within functional limits for tasks assessed                          SENSATION: WFL   MUSCLE LENGTH: WFLs   POSTURE: weight shift left   PALPATION: Pt with reduced segmental mobility in the lumbar spine without pain.  Tension over Rt lumbar paraspinals and QL.    LUMBAR ROM:    WFLs in all directions with pain on the Rt with extension and Rt sidebending    LOWER EXTREMITY ROM:    WFLs without pain   LOWER EXTREMITY MMT:     Bil knees 5/5, hip abduction 4+/5, extension 4+/5.   LUMBAR SPECIAL TESTS:  Slump test: Negative   GAIT: Distance walked: 50 Assistive device utilized: None Level of assistance: Complete Independence Comments: WFLs   TODAY'S TREATMENT:                                                                                                                              DATE:  01/17/23: Recumbent bike x3' L4 PT present to discuss status and plan for session Seated HS stretch bil 2x20" Reverse chair sit up 5lb 1x10 Sit to stand with bil 5lb chest press 1x10 5lb UE diagonal reverse chop with opp LE march to weight (go down in weight for UE next time) Chair plank with slow motion knee driver alt LE [id number] Variations of dying bug at 90/90 static holds to toe touch taps to arms overhead to opp arm/leg - TC and VC on knitted ribcage and pelvic tilt maintained Body mechanics for squatting in proper alignment: sternum over pubic bone using hip hinge Slider bwd lunge with single 5lb kbell row x10 each  Low pulley bil UE row with squat to stand x10, 10lb   01/12/23 Nustep L6 x 5 min  PT present to discuss plan for session Seated hamstring stretch bil 1x30" Seated fig 4 1x30" bil Lower trunk rotation using legs crossed for overpressure 1x30" bil Body mechanics Lifting 10# box x 5 Functional squat x 10, then with 10# wt Mini squat with OH arms x 10 sec then arms extended x 10 Standing lunge  2x5 ea intermittent UE support Supine  dying bug - 2x5 Bird dog with opp arm and leg extend and then crunch elbow to knee   01/10/23 UBE L3 2x2 PT present to discuss plan for session Seated hamstring stretch bil 1x30" Seated fig 4 1x30" bil Lower trunk rotation using legs crossed for overpressure 1x30" bil Supine piriformis stretch 1x30" bil Supine dying bug - very challenging to keep lumbar spine imprinted into table  Bird dog with opp arm and leg extend and then crunch elbow to knee Trigger Point Dry-Needling  Treatment instructions: Expect mild to moderate muscle soreness. S/S of pneumothorax if dry needled over a lung field, and to seek immediate medical attention should they occur. Patient verbalized understanding of these instructions and education.  Patient Consent Given: Yes Education handout provided: Previously provided Muscles treated: bil lumbar multifidi Electrical stimulation performed: No Parameters: N/A Treatment response/outcome: twich/cramp and elongation Manual therapy: prone STM lumbar after DN Updated HEP to include core   PATIENT EDUCATION:  Education details: Access Code: [email or web address], DN handout  Person educated: Patient Education method: Explanation, Demonstration, and Handouts Education comprehension: verbalized understanding and returned demonstration   HOME EXERCISE PROGRAM: Access Code: [email or web address] URL: [email or web address] Date: 01/17/2023 Prepared by: Venetia Night Happy Begeman  Exercises - Supine Piriformis Stretch  - 2-3 x daily - 7 x weekly - 1 sets - 3 reps - 20 hold - Supine Lower Trunk Rotation  - 2-3 x daily - 7 x weekly - 1 sets - 3 reps - 20 hold - Seated Hamstring Stretch  - 2-3 x daily - 7 x weekly - 1 sets - 3 reps - 20 hold - Seated Figure 4 Piriformis Stretch  - 3 x daily - 7 x weekly - 1 sets - 3 reps - 20 hold - Child's Pose with Sidebending  - 2 x daily - 7 x weekly - 1 sets - 3 reps - 20 hold - Dead Bug  - 1 x daily - 7 x weekly - 2 sets - 10 reps - Isometric Dead Bug   - 1 x daily - 7 x weekly - 1 sets - 5 reps - 5-10 sec hold - Bird Dog with Knee Taps  - 1 x daily - 7 x weekly - 2 sets - 10 reps - Squat with Chair Touch  - 1 x daily - 4 x weekly - 3 sets - 10 reps - Standard Lunge  - 1 x daily - 4 x weekly - 3 sets - 10 reps - Squat with Chair Touch  - 1 x daily - 7 x weekly - 2 sets - 10 reps - Reverse Lunge  - 1 x daily - 7 x weekly - 2 sets - 10 reps  Patient Education - Posture and Body Mechanics   ASSESSMENT:   CLINICAL IMPRESSION: Much of session spent finding ways to find and activate lower abdominals.  Pt did well with idea of sternum over pubic bone and releasing "butt gripping and back gripping" and had an "a-ha" moment of finding deep lower abdominals once aligned.  Was able to progress to hip hinge squats and slider lunges in prep for yardwork.  Pt has not noted much change with lumbar DN so will continue to focus on stabilization, functional movement patterns and strengthening of prime movers atop of stabilizers for return to gardening this spring.   OBJECTIVE IMPAIRMENTS: increased muscle spasms, postural dysfunction, and pain.    ACTIVITY LIMITATIONS: sitting, standing, and locomotion level   PARTICIPATION LIMITATIONS:  meal prep, cleaning, and occupation   PERSONAL FACTORS: 1 comorbidity: recurrence of LBP  are also affecting patient's functional outcome.    REHAB POTENTIAL: Excellent   CLINICAL DECISION MAKING: Stable/uncomplicated   EVALUATION COMPLEXITY: Low     GOALS: Goals reviewed with patient? Yes   SHORT TERM GOALS: Target date: 02/02/2023     Be independent in initial HEP Baseline: Goal status: INITIAL   2.  Report > or = to 30% reduction in LBP with work tasks  Baseline: 2-8/10 Goal status: INITIAL   3.  Verbalize and demonstrate body mechanics modifications for lumbar protection with daily tasks  Baseline:  Goal status: INITIAL     LONG TERM GOALS: Target date: 03/02/2023     Be independent in advanced  HEP Baseline:  Goal status: INITIAL   2.  Improve FOTO to > or = to 70 Baseline: 49 Goal status: INITIAL   3.  Report > or = to 70% reduction in LBP with daily tasks Baseline:  Goal status: INITIAL   4.  Sit for work without limitation due to LBP Baseline:  Goal status: INITIAL   5.  Report frequent change of position with work tasks to improve muscular balance  Baseline:  Goal status: INITIAL     PLAN:   PT FREQUENCY: 1-2x/week   PT DURATION: 8 weeks   PLANNED INTERVENTIONS: Therapeutic exercises, Therapeutic activity, Neuromuscular re-education, Balance training, Gait training, Patient/Family education, Self Care, Joint mobilization, Aquatic Therapy, Dry Needling, Electrical stimulation, Spinal mobilization, Cryotherapy, Moist heat, Taping, Traction, Manual therapy, and Re-evaluation.   PLAN FOR NEXT SESSION: continue core strength and LE functional strength;  progress body mechanics and LE functional strength     Deadrian Toya, PT 01/17/23 8:51 AM

## 2023-01-17 NOTE — Telephone Encounter (Signed)
Pt called to inform MD that she has been having a lot of problems with the [place] [facility]ist ... They keep rescheduling, etc...  Pt is asking MD to please send another referral for her to go to another location?  Please advise.

## 2023-01-18 NOTE — Therapy (Signed)
OUTPATIENT PHYSICAL THERAPY TREATMENT NOTE   Patient Name: Chris Leveque MRN: [id number] DOB:07-May-1976, 47 y.o., female Today's Date: 01/19/2023  PCP: Isaac Bliss, Olam Idler, MD REFERRING PROVIDER: Isaac Bliss, Olam Idler, MD  END OF SESSION:   PT End of Session - 01/19/23 0803     Visit Number 5    Date for PT Re-Evaluation 03/02/23    Authorization Type Aet[employer]    PT Start Time 0803    PT Stop Time 0843    PT Time Calculation (min) 40 min    Activity Tolerance Patient tolerated treatment well    Behavior During Therapy [facility] for tasks assessed/performed                Past Medical History:  Diagnosis Date   Endometrial mass    GERD (gastroesophageal reflux disease)    History of chemotherapy    09-19-2014  to 11/ 2016  left breast cancer   History of external beam radiation therapy    03-10-2015  to 04-27-2015  left breast cancer   Malignant neoplasm of upper-outer quadrant of left breast in female, estrogen receptor positive ([place]) 08/2014   oncologist--- dr Lindi Adie;   dx 10/ 2015;   Stage IIA,  chemo 09-19-2014  to 11/ 2016 ;   01-29-2015 left breast lumpectomy w/ sln bx;   IMRT completed 04-27-2015   Menorrhagia    Migraine without aura and without status migrainosus, not intractable    neurologist--  Ashok Pall NP   Past Surgical History:  Procedure Laterality Date   BREAST LUMPECTOMY WITH AXILLARY LYMPH NODE BIOPSY Left 01/29/2015   DILATATION & CURETTAGE/HYSTEROSCOPY WITH MYOSURE N/A 09/28/2022   Procedure: DILATATION & CURETTAGE/HYSTEROSCOPY WITH MYOSURE;  Surgeon: Christophe Louis, MD;  Location: [facility];  Service: Gynecology;  Laterality: N/A;   DILITATION & CURRETTAGE/HYSTROSCOPY WITH HYDROTHERMAL ABLATION N/A 09/28/2022   Procedure: DILATATION & CURETTAGE/HYSTEROSCOPY WITH HYDROTHERMAL ABLATION;  Surgeon: Christophe Louis, MD;  Location: [facility];  Service: Gynecology;  Laterality: N/A;   ECTOPIC PREGNANCY SURGERY   05/2009   '@[facility]'$ ;    laparoscopic turned laparotomy w/ right salpingectomy   Patient Active Problem List   Diagnosis Date Noted   Migraines 07/31/2020   Genetic testing - OvaNext gene panel 10/09/2014   Breast cancer of upper-outer quadrant of left female breast ([place]) 08/22/2014    REFERRING DIAG: M54.50 (ICD-10-CM) - Acute right-sided low back pain without sciatica   THERAPY DIAG:  Other low back pain  Cramp and spasm  Muscle weakness (generalized)  Rationale for Evaluation and Treatment Rehabilitation  PERTINENT HISTORY: Lt breast cancer 2015 with lymph nodes, migraines  PRECAUTIONS: Other: history of breast cancer    WEIGHT BEARING RESTRICTIONS: No   FALLS:  Has patient fallen in last 6 months? 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 I'm trying to get back into stuff.    PAIN:  Are you having pain? Yes: NPRS scale: 3/10 Pain location: Rt side of low back  Pain description: sore, dull, sometimes sharp Aggravating factors: standing too long, sitting at work for long periods, walking  Relieving factors: supine, heat, meds   OBJECTIVE: (objective measures completed at initial evaluation unless otherwise dated)   DIAGNOSTIC FINDINGS:  None    PATIENT SURVEYS:  FOTO 49 (goal is 55)   SCREENING FOR RED FLAGS: Bowel or bladder incontinence: No Spinal tumors: No Cauda equina syndrome: No Compression fracture: No Abdominal aneurysm: No   COGNITION: Overall cognitive status: Within functional limits for  tasks assessed                          SENSATION: WFL   MUSCLE LENGTH: WFLs   POSTURE: weight shift left   PALPATION: Pt with reduced segmental mobility in the lumbar spine without pain.  Tension over Rt lumbar paraspinals and QL.    LUMBAR ROM:    WFLs in all directions with pain on the Rt with extension and Rt sidebending    LOWER EXTREMITY ROM:    WFLs without pain   LOWER EXTREMITY MMT:     Bil knees 5/5, hip abduction 4+/5, extension 4+/5.   LUMBAR SPECIAL TESTS:  Slump test: Negative   GAIT: Distance walked: 50 Assistive device utilized: None Level of assistance: Complete Independence Comments: WFLs   TODAY'S TREATMENT:                                                                                                                              DATE:  01/19/23: Recumbent bike x3' L4 PT present to discuss status and plan for session Seated HS stretch bil 2x20" Seated fig 4 2x 20' Sit to stand with bil 5lb chest press 1x10, 8# 1x10 5lb squat chops x 10 bil Mountain climber x 8 ea Variations of dying bug at 90/90 static holds to toe touch taps to arms overhead to opp arm/leg - x 10 ea 90/90 with bil OH flex 8# x 10 Slider bwd lunge with single 5lb kbell row x10 each  Low pulley bil UE row with squat to stand x10, 10lb 5# lawnmower x 10 ea Supine on elbows - bil leg press x 10 Side plank with clam (feet apart) and arm reach toward ceiling x 10 ea  01/17/23: Recumbent bike x3' L4 PT present to discuss status and plan for session Seated HS stretch bil 2x20" Reverse chair sit up 5lb 1x10 Sit to stand with bil 5lb chest press 1x10 5lb UE diagonal reverse chop with opp LE march to weight (go down in weight for UE next time) Chair plank with slow motion knee driver alt LE [id number] Variations of dying bug at 90/90 static holds to toe touch taps to arms overhead to opp  arm/leg - TC and VC on knitted ribcage and pelvic tilt maintained Body mechanics for squatting in proper  alignment: sternum over pubic bone using hip hinge Slider bwd lunge with single 5lb kbell row x10 each  Low pulley bil UE row with squat to stand x10, 10lb   01/12/23 Nustep L6 x 5 min  PT present to discuss plan for session Seated hamstring stretch bil 1x30" Seated fig 4 1x30" bil Lower trunk rotation using legs crossed for overpressure 1x30" bil Body mechanics Lifting 10# box x 5 Functional squat x 10, then with 10# wt Mini squat with OH arms x 10 sec then arms extended x 10 Standing lunge 2x5 ea intermittent UE support Supine dying bug - 2x5 Bird dog with opp arm and leg extend and then crunch elbow to knee   01/10/23 UBE L3 2x2 PT present to discuss plan for session Seated hamstring stretch bil 1x30" Seated fig 4 1x30" bil Lower trunk rotation using legs crossed for overpressure 1x30" bil Supine piriformis stretch 1x30" bil Supine dying bug - very challenging to keep lumbar spine imprinted into table  Bird dog with opp arm and leg extend and then crunch elbow to knee Trigger Point Dry-Needling  Treatment instructions: Expect mild to moderate muscle soreness. S/S of pneumothorax if dry needled over a lung field, and to seek immediate medical attention should they occur. Patient verbalized understanding of these instructions and education.  Patient Consent Given: Yes Education handout provided: Previously provided Muscles treated: bil lumbar multifidi Electrical stimulation performed: No Parameters: N/A Treatment response/outcome: twich/cramp and elongation Manual therapy: prone STM lumbar after DN Updated HEP to include core   PATIENT EDUCATION:  Education details: Access Code: [email or web address], DN handout  Person educated: Patient Education method: Explanation, Demonstration, and Handouts Education comprehension: verbalized understanding and returned demonstration   HOME EXERCISE PROGRAM: Access Code: [email or web address] URL: [email or web address] Date: 01/17/2023 Prepared  by: Venetia Night Beuhring  Exercises - Supine Piriformis Stretch  - 2-3 x daily - 7 x weekly - 1 sets - 3 reps - 20 hold - Supine Lower Trunk Rotation  - 2-3 x daily - 7 x weekly - 1 sets - 3 reps - 20 hold - Seated Hamstring Stretch  - 2-3 x daily - 7 x weekly - 1 sets - 3 reps - 20 hold - Seated Figure 4 Piriformis Stretch  - 3 x daily - 7 x weekly - 1 sets - 3 reps - 20 hold - Child's Pose with Sidebending  - 2 x daily - 7 x weekly - 1 sets - 3 reps - 20 hold - Dead Bug  - 1 x daily - 7 x weekly - 2 sets - 10 reps - Isometric Dead Bug  - 1 x daily - 7 x weekly - 1 sets - 5 reps - 5-10 sec hold - Bird Dog with Knee Taps  - 1 x daily - 7 x weekly - 2 sets - 10 reps - Squat with Chair Touch  - 1 x daily - 4 x weekly - 3 sets - 10 reps - Standard Lunge  - 1 x daily - 4 x weekly - 3 sets - 10 reps - Squat with Chair Touch  - 1 x daily - 7 x weekly - 2 sets - 10 reps - Reverse Lunge  - 1 x daily - 7 x weekly - 2 sets - 10 reps  Patient Education - Posture and Body Mechanics   ASSESSMENT:   CLINICAL IMPRESSION: Shauntia did very  well with TE today requiring minimal cueing for form, mostly with upper body row activities. Able to progress moiuntain climbers to full plank position today which was very challenging. She did better stabilizing in 90/90 position today. She may benefit from Pallof exercises in standing, 1/2 kneel and SLS to further simulate yard work.   OBJECTIVE IMPAIRMENTS: increased muscle spasms, postural dysfunction, and pain.    ACTIVITY LIMITATIONS: sitting, standing, and locomotion level   PARTICIPATION LIMITATIONS: meal prep, cleaning, and occupation   PERSONAL FACTORS: 1 comorbidity: recurrence of LBP  are also affecting patient's functional outcome.    REHAB POTENTIAL: Excellent   CLINICAL DECISION MAKING: Stable/uncomplicated   EVALUATION COMPLEXITY: Low     GOALS: Goals reviewed with patient? Yes   SHORT TERM GOALS: Target date: 02/02/2023     Be independent in  initial HEP Baseline: Goal status: INITIAL   2.  Report > or = to 30% reduction in LBP with work tasks  Baseline: 2-8/10 Goal status: INITIAL   3.  Verbalize and demonstrate body mechanics modifications for lumbar protection with daily tasks  Baseline:  Goal status: INITIAL     LONG TERM GOALS: Target date: 03/02/2023     Be independent in advanced HEP Baseline:  Goal status: INITIAL   2.  Improve FOTO to > or = to 70 Baseline: 49 Goal status: INITIAL   3.  Report > or = to 70% reduction in LBP with daily tasks Baseline:  Goal status: INITIAL   4.  Sit for work without limitation due to LBP Baseline:  Goal status: INITIAL   5.  Report frequent change of position with work tasks to improve muscular balance  Baseline:  Goal status: INITIAL     PLAN:   PT FREQUENCY: 1-2x/week   PT DURATION: 8 weeks   PLANNED INTERVENTIONS: Therapeutic exercises, Therapeutic activity, Neuromuscular re-education, Balance training, Gait training, Patient/Family education, Self Care, Joint mobilization, Aquatic Therapy, Dry Needling, Electrical stimulation, Spinal mobilization, Cryotherapy, Moist heat, Taping, Traction, Manual therapy, and Re-evaluation.   PLAN FOR NEXT SESSION: continue core strength and LE functional strength;  progress body mechanics and LE functional strength     Johanna Beuhring, PT 01/19/23 8:54 AM

## 2023-01-19 ENCOUNTER — Encounter: Payer: Self-pay | Admitting: Physical Therapy

## 2023-01-19 ENCOUNTER — Ambulatory Visit: Payer: 59 | Admitting: Physical Therapy

## 2023-01-19 DIAGNOSIS — M6281 Muscle weakness (generalized): Secondary | ICD-10-CM

## 2023-01-19 DIAGNOSIS — M5459 Other low back pain: Secondary | ICD-10-CM | POA: Diagnosis not present

## 2023-01-19 DIAGNOSIS — R252 Cramp and spasm: Secondary | ICD-10-CM

## 2023-01-23 ENCOUNTER — Ambulatory Visit: Payer: 59 | Admitting: Physical Therapy

## 2023-01-23 ENCOUNTER — Encounter: Payer: Self-pay | Admitting: Physical Therapy

## 2023-01-23 DIAGNOSIS — M6281 Muscle weakness (generalized): Secondary | ICD-10-CM

## 2023-01-23 DIAGNOSIS — M5459 Other low back pain: Secondary | ICD-10-CM | POA: Diagnosis not present

## 2023-01-23 DIAGNOSIS — R252 Cramp and spasm: Secondary | ICD-10-CM

## 2023-01-23 NOTE — Telephone Encounter (Signed)
Spoke with patient and she is okay staying with  Energy manager .

## 2023-01-23 NOTE — Therapy (Signed)
OUTPATIENT PHYSICAL THERAPY TREATMENT NOTE   Patient Name: Kim Armstrong MRN: [id number] DOB:04-26-1976, 47 y.o., female Today's Date: 01/23/2023  PCP: Isaac Bliss, Olam Idler, MD REFERRING PROVIDER: Isaac Bliss, Olam Idler, MD  END OF SESSION:   PT End of Session - 01/23/23 0804     Visit Number 6    Date for PT Re-Evaluation 03/02/23    Authorization Type Aetna    PT Start Time 0803    PT Stop Time 0845    PT Time Calculation (min) 42 min    Activity Tolerance Patient tolerated treatment well    Behavior During Therapy [facility] for tasks assessed/performed                 Past Medical History:  Diagnosis Date   Endometrial mass    GERD (gastroesophageal reflux disease)    History of chemotherapy    09-19-2014  to 11/ 2016  left breast cancer   History of external beam radiation therapy    03-10-2015  to 04-27-2015  left breast cancer   Malignant neoplasm of upper-outer quadrant of left breast in female, estrogen receptor positive ([place]) 08/2014   oncologist--- dr Lindi Adie;   dx 10/ 2015;   Stage IIA,  chemo 09-19-2014  to 11/ 2016 ;   01-29-2015 left breast lumpectomy w/ sln bx;   IMRT completed 04-27-2015   Menorrhagia    Migraine without aura and without status migrainosus, not intractable    neurologist--  Ashok Pall NP   Past Surgical History:  Procedure Laterality Date   BREAST LUMPECTOMY WITH AXILLARY LYMPH NODE BIOPSY Left 01/29/2015   DILATATION & CURETTAGE/HYSTEROSCOPY WITH MYOSURE N/A 09/28/2022   Procedure: DILATATION & CURETTAGE/HYSTEROSCOPY WITH MYOSURE;  Surgeon: Christophe Louis, MD;  Location: [facility];  Service: Gynecology;  Laterality: N/A;   DILITATION & CURRETTAGE/HYSTROSCOPY WITH HYDROTHERMAL ABLATION N/A 09/28/2022   Procedure: DILATATION & CURETTAGE/HYSTEROSCOPY WITH HYDROTHERMAL ABLATION;  Surgeon: Christophe Louis, MD;  Location: [facility];  Service: Gynecology;  Laterality: N/A;   ECTOPIC PREGNANCY SURGERY   05/2009   @[facility] ;    laparoscopic turned laparotomy w/ right salpingectomy   Patient Active Problem List   Diagnosis Date Noted   Migraines 07/31/2020   Genetic testing - OvaNext gene panel 10/09/2014   Breast cancer of upper-outer quadrant of left female breast ([place]) 08/22/2014    REFERRING DIAG: M54.50 (ICD-10-CM) - Acute right-sided low back pain without sciatica   THERAPY DIAG:  Other low back pain  Cramp and spasm  Muscle weakness (generalized)  Rationale for Evaluation and Treatment Rehabilitation  PERTINENT HISTORY: Lt breast cancer 2015 with lymph nodes, migraines  PRECAUTIONS: Other: history of breast cancer    WEIGHT BEARING RESTRICTIONS: No   FALLS:  Has patient fallen in last 6 months? 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  I did a lot of spring cleaning this weekend.  I am stiff this morning but not in pain.   PAIN:  Are you having pain? Yes: NPRS scale: 1/10 Pain location: Rt side of low back  Pain description: sore, dull, sometimes sharp Aggravating factors: standing too long, sitting at work for long periods, walking  Relieving factors: supine, heat, meds   OBJECTIVE: (objective measures completed at initial evaluation unless otherwise dated)   DIAGNOSTIC FINDINGS:  None    PATIENT SURVEYS:  FOTO 49 (goal is 53)   SCREENING FOR RED FLAGS: Bowel or bladder incontinence: No Spinal tumors: No Cauda equina syndrome:  No Compression fracture: No Abdominal aneurysm: No   COGNITION: Overall cognitive status: Within functional limits for tasks assessed                          SENSATION: WFL   MUSCLE LENGTH: WFLs   POSTURE: weight shift left   PALPATION: Pt with reduced segmental mobility in the lumbar spine without pain.  Tension over Rt lumbar paraspinals and QL.    LUMBAR ROM:    WFLs in all directions with pain on the Rt with extension and Rt sidebending    LOWER EXTREMITY ROM:    WFLs without pain   LOWER EXTREMITY MMT:     Bil knees 5/5, hip abduction 4+/5, extension 4+/5.   LUMBAR SPECIAL TESTS:  Slump test: Negative   GAIT: Distance walked: 50 Assistive device utilized: None Level of assistance: Complete Independence Comments: WFLs   TODAY'S TREATMENT:                                                                                                                              DATE:  01/23/23: Recumbent bike x4' L4 PT present to discuss status and plan for session Seated fig 4 and HS stretch bil 2x20" each Variations of dying bug at 90/90 static holds to toe touch taps to arms overhead to opp arm/leg - x 10 ea Plank on hands x10" then add 8x mountain climbers Standing plank hands on mat table mountain climber to hip ext x 5 each Squat to mat table with 8lb chest press x 15 Standing T 8lb x10 each LE Slider bwd lunge with single 8lb kbell row x10 each  Seated on green physioball pallof press green tband 2x10 1/2 kneel on airex 3lb chop and reverse chop x10 each way SLS on airex pad 3-way clock taps 5 rounds each LE (more ankle stability than core and LE) SLS with mini squat 3-way clock taps x 3 rounds each LE (felt more in quad and hip)   01/19/23: Recumbent bike x3' L4 PT present to discuss status and plan for session Seated HS stretch bil 2x20" Seated fig 4 2x 20' Sit to stand with bil  5lb chest press 1x10, 8# 1x10 5lb squat chops x 10 bil Mountain climber x 8  ea Variations of dying bug at 90/90 static holds to toe touch taps to arms overhead to opp arm/leg - x 10 ea 90/90 with bil OH flex 8# x 10 Slider bwd lunge with single 5lb kbell row x10 each  Low pulley bil UE row with squat to stand x10, 10lb 5# lawnmower x 10 ea Supine on elbows - bil leg press x 10 Side plank with clam (feet apart) and arm reach toward ceiling x 10 ea  01/17/23: Recumbent bike x3' L4 PT present to discuss status and plan for session Seated HS stretch bil 2x20" Reverse chair sit up 5lb 1x10 Sit to stand with bil 5lb chest press 1x10 5lb UE diagonal reverse chop with opp LE march to weight (go down in weight for UE next time) Chair plank with slow motion knee driver alt LE [id number] Variations of dying bug at 90/90 static holds to toe touch taps to arms overhead to opp arm/leg - TC and VC on knitted ribcage and pelvic tilt maintained Body mechanics for squatting in proper alignment: sternum over pubic bone using hip hinge Slider bwd lunge with single 5lb kbell row x10 each  Low pulley bil UE row with squat to stand x10, 10lb    PATIENT EDUCATION:  Education details: Access Code: [email or web address], DN handout  Person educated: Patient Education method: Explanation, Demonstration, and Handouts Education comprehension: verbalized understanding and returned demonstration   HOME EXERCISE PROGRAM: Access Code: [email or web address] URL: [email or web address] Date: 01/23/2023 Prepared by: Venetia Night Ramaj Frangos  Exercises - Supine Piriformis Stretch  - 2-3 x daily - 7 x weekly - 1 sets - 3 reps - 20 hold - Supine Lower Trunk Rotation  - 2-3 x daily - 7 x weekly - 1 sets - 3 reps - 20 hold - Seated Hamstring Stretch  - 2-3 x daily - 7 x weekly - 1 sets - 3 reps - 20 hold - Seated Figure 4 Piriformis Stretch  - 3 x daily - 7 x weekly - 1 sets - 3 reps - 20 hold - Child's Pose with Sidebending  - 2 x daily - 7 x weekly - 1 sets - 3 reps - 20 hold - Dead Bug  - 1 x daily - 7 x weekly - 2  sets - 10 reps - Isometric Dead Bug  - 1 x daily - 7 x weekly - 1 sets - 5 reps - 5-10 sec hold - Bird Dog with Knee Taps  - 1 x daily - 7 x weekly - 2 sets - 10 reps - Squat with Chair Touch  - 1 x daily - 4 x weekly - 3 sets - 10 reps - Standard Lunge  - 1 x daily - 4 x weekly - 3 sets - 10 reps - Squat with Chair Touch  - 1 x daily - 7 x weekly - 2 sets - 10 reps - Reverse Lunge  - 1 x daily - 7 x weekly - 2 sets - 10 reps - Standing Anti-Rotation Press with Anchored Resistance  - 1 x daily - 7 x weekly - 2 sets - 10 reps - Single Leg Deadlift with Kettlebell  - 1 x daily - 7 x weekly - 1 sets - 10 reps   ASSESSMENT:   CLINICAL IMPRESSION: Pt arrived with 1/10 stiffness in Rt lumbar region.  She reports improved tolerance of housework and Spring cleaning over the weekend  with less pain.  She has met all STGs and is making progress toward LTGs.  She demonstrates much improved body alignment and core control.  She needed VC to avoid rotating around hip on standing single leg deadlift today.  She has trouble controlling knee valgus with SLS mini squat with lateral clock taps.  Tried SLS on airex pad for clocks but Pt mostly experienced ankle control challenge over LE and core so d/c'd airex pad for this.  Introduced half knee and seated on ball with chops and pallof press today to simulate various positions for yardwork with good tolerance.  OBJECTIVE IMPAIRMENTS: increased muscle spasms, postural dysfunction, and pain.    ACTIVITY LIMITATIONS: sitting, standing, and locomotion level   PARTICIPATION LIMITATIONS: meal prep, cleaning, and occupation   PERSONAL FACTORS: 1 comorbidity: recurrence of LBP  are also affecting patient's functional outcome.    REHAB POTENTIAL: Excellent   CLINICAL DECISION MAKING: Stable/uncomplicated   EVALUATION COMPLEXITY: Low     GOALS: Goals reviewed with patient? Yes   SHORT TERM GOALS: Target date: 02/02/2023     Be independent in initial  HEP Baseline: Goal status: met   2.  Report > or = to 30% reduction in LBP with work tasks  Baseline: 2-8/10 Goal status: met   3.  Verbalize and demonstrate body mechanics modifications for lumbar protection with daily tasks  Baseline:  Goal status: met     LONG TERM GOALS: Target date: 03/02/2023     Be independent in advanced HEP Baseline:  Goal status: ongoing   2.  Improve FOTO to > or = to 70 Baseline: 49 Goal status: INITIAL   3.  Report > or = to 70% reduction in LBP with daily tasks Baseline:  Goal status: INITIAL   4.  Sit for work without limitation due to LBP Baseline:  Goal status: INITIAL   5.  Report frequent change of position with work tasks to improve muscular balance  Baseline:  Goal status: INITIAL     PLAN:   PT FREQUENCY: 1-2x/week   PT DURATION: 8 weeks   PLANNED INTERVENTIONS: Therapeutic exercises, Therapeutic activity, Neuromuscular re-education, Balance training, Gait training, Patient/Family education, Self Care, Joint mobilization, Aquatic Therapy, Dry Needling, Electrical stimulation, Spinal mobilization, Cryotherapy, Moist heat, Taping, Traction, Manual therapy, and Re-evaluation.   PLAN FOR NEXT SESSION: do FOTO and check LTGs, continue core strength and LE functional strength;  progress body mechanics and LE functional strength    Jesten Cappuccio, PT 01/23/23 8:51 AM

## 2023-01-26 NOTE — Therapy (Signed)
OUTPATIENT PHYSICAL THERAPY TREATMENT NOTE   Patient Name: Kim Armstrong MRN: [id number] DOB:1976/10/13, 47 y.o., female Today's Date: 01/27/2023  PCP: Isaac Bliss, Olam Idler, MD REFERRING PROVIDER: Isaac Bliss, Olam Idler, MD  END OF SESSION:   PT End of Session - 01/27/23 0805     Visit Number 7    Date for PT Re-Evaluation 03/02/23    Authorization Type Aetna    PT Start Time 0803    PT Stop Time [id number]    PT Time Calculation (min) 44 min    Activity Tolerance Patient tolerated treatment well    Behavior During Therapy [facility] for tasks assessed/performed             Past Medical History:  Diagnosis Date   Endometrial mass    GERD (gastroesophageal reflux disease)    History of chemotherapy    09-19-2014  to 11/ 2016  left breast cancer   History of external beam radiation therapy    03-10-2015  to 04-27-2015  left breast cancer   Malignant neoplasm of upper-outer quadrant of left breast in female, estrogen receptor positive ([place]) 08/2014   oncologist--- dr Lindi Adie;   dx 10/ 2015;   Stage IIA,  chemo 09-19-2014  to 11/ 2016 ;   01-29-2015 left breast lumpectomy w/ sln bx;   IMRT completed 04-27-2015   Menorrhagia    Migraine without aura and without status migrainosus, not intractable    neurologist--  Ashok Pall NP   Past Surgical History:  Procedure Laterality Date   BREAST LUMPECTOMY WITH AXILLARY LYMPH NODE BIOPSY Left 01/29/2015   DILATATION & CURETTAGE/HYSTEROSCOPY WITH MYOSURE N/A 09/28/2022   Procedure: DILATATION & CURETTAGE/HYSTEROSCOPY WITH MYOSURE;  Surgeon: Christophe Louis, MD;  Location: [place];  Service: Gynecology;  Laterality: N/A;   DILITATION & CURRETTAGE/HYSTROSCOPY WITH HYDROTHERMAL ABLATION N/A 09/28/2022   Procedure: DILATATION & CURETTAGE/HYSTEROSCOPY WITH HYDROTHERMAL ABLATION;  Surgeon: Christophe Louis, MD;  Location: [facility];  Service: Gynecology;  Laterality: N/A;   ECTOPIC PREGNANCY SURGERY   05/2009   @[facility] ;    laparoscopic turned laparotomy w/ right salpingectomy   Patient Active Problem List   Diagnosis Date Noted   Migraines 07/31/2020   Genetic testing - OvaNext gene panel 10/09/2014   Breast cancer of upper-outer quadrant of left female breast ([place]) 08/22/2014    REFERRING DIAG: M54.50 (ICD-10-CM) - Acute right-sided low back pain without sciatica   THERAPY DIAG:  Other low back pain  Cramp and spasm  Muscle weakness (generalized)  Rationale for Evaluation and Treatment Rehabilitation  PERTINENT HISTORY: Lt breast cancer 2015 with lymph nodes, migraines  PRECAUTIONS: Other: history of breast cancer    WEIGHT BEARING RESTRICTIONS: No   FALLS:  Has patient fallen in last 6 months? 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  I did a lot of spring cleaning this weekend.  I am stiff this morning but not in pain.   PAIN:  Are you having pain? Yes: NPRS scale: 1/10 Pain location: Rt side of low back  Pain description: sore, dull, sometimes sharp Aggravating factors: standing too long, sitting at work for long periods, walking  Relieving factors: supine, heat, meds   OBJECTIVE: (objective measures completed at initial evaluation unless otherwise dated)   DIAGNOSTIC FINDINGS:  None    PATIENT SURVEYS:  FOTO 49 (goal is 53)   SCREENING FOR RED FLAGS: Bowel or bladder incontinence: No Spinal tumors: No Cauda equina syndrome:  No Compression fracture: No Abdominal aneurysm: No   COGNITION: Overall cognitive status: Within functional limits for tasks assessed                          SENSATION: WFL   MUSCLE LENGTH: WFLs   POSTURE: weight shift left   PALPATION: Pt with reduced segmental mobility in the lumbar spine without pain.  Tension over Rt lumbar paraspinals and QL.    LUMBAR ROM:    WFLs in all directions with pain on the Rt with extension and Rt sidebending    LOWER EXTREMITY ROM:    WFLs without pain   LOWER EXTREMITY MMT:     Bil knees 5/5, hip abduction 4+/5, extension 4+/5.   LUMBAR SPECIAL TESTS:  Slump test: Negative   GAIT: Distance walked: 50 Assistive device utilized: None Level of assistance: Complete Independence Comments: WFLs   TODAY'S TREATMENT:                                                                                                                              DATE:  01/27/23: Recumbent bike x4' L4 PT present to discuss status and plan for session Seated fig 4 and HS stretch bil 2x20" each Variations of dying bug at 90/90 static holds to toe touch taps to arms overhead to opp arm/leg - x 10 ea Plank on hands x10" then add 8x mountain climbers PPT x 20 due to reports of increased tension. Squat to mat table with 8lb chest press x 15 1/2 kneel on airex 3lb chop and reverse chop x10 each way Trigger Point Dry-Needling  Treatment instructions: Expect mild to moderate muscle soreness. S/S of pneumothorax if dry needled over a lung field, and to seek immediate medical attention should they occur. Patient verbalized understanding of these instructions and education.  Patient Consent Given: No Education handout provided: Previously provided Muscles treated: Bil lumbar muiltifidi and paraspinals L3-L4 Electrical stimulation performed: No Parameters: N/A Treatment response/outcome: Reduced tension and increased mobility.  STM to L multifidi due to increased tension  noted.   01/23/23: Recumbent bike x4' L4 PT present to discuss status and plan for session Seated  fig 4 and HS stretch bil 2x20" each Variations of dying bug at 90/90 static holds to toe touch taps to arms overhead to opp arm/leg - x 10 ea Plank on hands x10" then add 8x mountain climbers Standing plank hands on mat table mountain climber to hip ext x 5 each Squat to mat table with 8lb chest press x 15 Standing T 8lb x10 each LE Slider bwd lunge with single 8lb kbell row x10 each  Seated on green physioball pallof press green tband 2x10 1/2 kneel on airex 3lb chop and reverse chop x10 each way SLS on airex pad 3-way clock taps 5 rounds each LE (more ankle stability than core and LE) SLS with mini squat 3-way clock taps x 3 rounds each LE (felt more in quad and hip)   01/19/23: Recumbent bike x3' L4 PT present to discuss status and plan for session Seated HS stretch bil 2x20" Seated fig 4 2x 20' Sit to stand with bil 5lb chest press 1x10, 8# 1x10 5lb squat chops x 10 bil Mountain climber x 8 ea Variations of dying bug at 90/90 static holds to toe touch taps to arms overhead to opp arm/leg - x 10 ea 90/90 with bil OH flex 8# x 10 Slider bwd lunge with single 5lb kbell row x10 each  Low pulley bil UE row with squat to stand x10, 10lb 5# lawnmower x 10 ea Supine on elbows - bil leg press x 10 Side plank with clam (feet apart) and arm reach toward ceiling x 10 ea  01/17/23: Recumbent bike x3' L4 PT present to discuss status and plan for session Seated HS stretch bil 2x20" Reverse chair sit up 5lb 1x10 Sit to stand with bil 5lb chest press 1x10 5lb UE diagonal reverse chop with opp LE march to weight (go down in weight for UE next time) Chair plank with slow motion knee driver alt LE [id number] Variations of dying bug at 90/90 static holds to toe touch taps to arms overhead to opp arm/leg - TC and VC on knitted ribcage and pelvic tilt maintained Body mechanics for squatting in proper  alignment: sternum over pubic bone using hip hinge Slider bwd lunge with single 5lb kbell row x10 each  Low pulley bil UE row with squat to stand x10, 10lb    PATIENT EDUCATION:  Education details: Access Code: [email or web address], DN handout  Person educated: Patient Education method: Explanation, Demonstration, and Handouts Education comprehension: verbalized understanding and returned demonstration   HOME EXERCISE PROGRAM: Access Code: [email or web address] URL: [email or web address] Date: 01/23/2023 Prepared by: Venetia Night Beuhring  Exercises - Supine Piriformis Stretch  - 2-3 x daily - 7 x weekly - 1 sets - 3 reps - 20 hold - Supine Lower Trunk Rotation  - 2-3 x daily - 7 x weekly - 1 sets - 3 reps - 20 hold - Seated Hamstring Stretch  - 2-3 x daily - 7 x weekly - 1 sets - 3 reps - 20 hold - Seated Figure 4 Piriformis Stretch  - 3 x daily - 7 x weekly - 1 sets - 3 reps - 20 hold - Child's Pose with Sidebending  - 2 x daily - 7 x weekly - 1 sets - 3 reps - 20 hold - Dead Bug  - 1 x daily - 7 x weekly - 2 sets - 10 reps - Isometric Dead Bug  - 1 x daily - 7 x weekly - 1 sets - 5 reps - 5-10 sec hold - [employer]  Dog with Knee Taps  - 1 x daily - 7 x weekly - 2 sets - 10 reps - Squat with Chair Touch  - 1 x daily - 4 x weekly - 3 sets - 10 reps - Standard Lunge  - 1 x daily - 4 x weekly - 3 sets - 10 reps - Squat with Chair Touch  - 1 x daily - 7 x weekly - 2 sets - 10 reps - Reverse Lunge  - 1 x daily - 7 x weekly - 2 sets - 10 reps - Standing Anti-Rotation Press with Anchored Resistance  - 1 x daily - 7 x weekly - 2 sets - 10 reps - Single Leg Deadlift with Kettlebell  - 1 x daily - 7 x weekly - 1 sets - 10 reps   ASSESSMENT:   CLINICAL IMPRESSION: Pt arrived with a flare up this morning without known cause. Session with continued focus on core control and lumbar mobility today to tolerance. Pt demonstrates improved body alignment and core control. Per past experience with TPDN, pt requested it  today. She tolerated it well with no adverse effects but increased tension noted on L paraspinals > R. Pt will continue to benefit from skilled PT to address continued deficits.   OBJECTIVE IMPAIRMENTS: increased muscle spasms, postural dysfunction, and pain.    ACTIVITY LIMITATIONS: sitting, standing, and locomotion level   PARTICIPATION LIMITATIONS: meal prep, cleaning, and occupation   PERSONAL FACTORS: 1 comorbidity: recurrence of LBP  are also affecting patient's functional outcome.    REHAB POTENTIAL: Excellent   CLINICAL DECISION MAKING: Stable/uncomplicated   EVALUATION COMPLEXITY: Low     GOALS: Goals reviewed with patient? Yes   SHORT TERM GOALS: Target date: 02/02/2023     Be independent in initial HEP Baseline: Goal status: met   2.  Report > or = to 30% reduction in LBP with work tasks  Baseline: 2-8/10 Goal status: met   3.  Verbalize and demonstrate body mechanics modifications for lumbar protection with daily tasks  Baseline:  Goal status: met     LONG TERM GOALS: Target date: 03/02/2023     Be independent in advanced HEP Baseline:  Goal status: ongoing   2.  Improve FOTO to > or = to 70 Baseline: 49 Goal status: INITIAL   3.  Report > or = to 70% reduction in LBP with daily tasks Baseline:  Goal status: INITIAL   4.  Sit for work without limitation due to LBP Baseline:  Goal status: INITIAL   5.  Report frequent change of position with work tasks to improve muscular balance  Baseline:  Goal status: INITIAL     PLAN:   PT FREQUENCY: 1-2x/week   PT DURATION: 8 weeks   PLANNED INTERVENTIONS: Therapeutic exercises, Therapeutic activity, Neuromuscular re-education, Balance training, Gait training, Patient/Family education, Self Care, Joint mobilization, Aquatic Therapy, Dry Needling, Electrical stimulation, Spinal mobilization, Cryotherapy, Moist heat, Taping, Traction, Manual therapy, and Re-evaluation.   PLAN FOR NEXT SESSION: do FOTO  and check LTGs, continue core strength and LE functional strength;  progress body mechanics and LE functional strength  Rudi Heap PT, DPT 01/27/23  9:06 AM

## 2023-01-27 ENCOUNTER — Encounter: Payer: Self-pay | Admitting: Physical Therapy

## 2023-01-27 ENCOUNTER — Ambulatory Visit: Payer: 59 | Admitting: Physical Therapy

## 2023-01-27 DIAGNOSIS — R252 Cramp and spasm: Secondary | ICD-10-CM

## 2023-01-27 DIAGNOSIS — M5459 Other low back pain: Secondary | ICD-10-CM | POA: Diagnosis not present

## 2023-01-27 DIAGNOSIS — M6281 Muscle weakness (generalized): Secondary | ICD-10-CM

## 2023-01-30 NOTE — Therapy (Signed)
OUTPATIENT PHYSICAL THERAPY TREATMENT NOTE   Patient Name: Kim Armstrong MRN: [id number] DOB:[birth year], 47 y.o., female Today's Date: 01/31/2023  PCP: Isaac Bliss, Olam Idler, MD REFERRING PROVIDER: Isaac Bliss, Olam Idler, MD  END OF SESSION:   PT End of Session - 01/31/23 0804     Visit Number 8    Date for PT Re-Evaluation 03/02/23    Authorization Type Aetna    PT Start Time 0804    Activity Tolerance Patient tolerated treatment well    Behavior During Therapy [facility] for tasks assessed/performed             Past Medical History:  Diagnosis Date   Endometrial mass    GERD (gastroesophageal reflux disease)    History of chemotherapy    09-19-2014  to 11/ 2016  left breast cancer   History of external beam radiation therapy    03-10-2015  to 04-27-2015  left breast cancer   Malignant neoplasm of upper-outer quadrant of left breast in female, estrogen receptor positive ([place]) 08/2014   oncologist--- dr Lindi Adie;   dx 10/ 2015;   Stage IIA,  chemo 09-19-2014  to 11/ 2016 ;   01-29-2015 left breast lumpectomy w/ sln bx;   IMRT completed 04-27-2015   Menorrhagia    Migraine without aura and without status migrainosus, not intractable    neurologist--  Ashok Pall NP   Past Surgical History:  Procedure Laterality Date   BREAST LUMPECTOMY WITH AXILLARY LYMPH NODE BIOPSY Left 01/29/2015   DILATATION & CURETTAGE/HYSTEROSCOPY WITH MYOSURE N/A 09/28/2022   Procedure: DILATATION & CURETTAGE/HYSTEROSCOPY WITH MYOSURE;  Surgeon: Christophe Louis, MD;  Location: [place];  Service: Gynecology;  Laterality: N/A;   DILITATION & CURRETTAGE/HYSTROSCOPY WITH HYDROTHERMAL ABLATION N/A 09/28/2022   Procedure: DILATATION & CURETTAGE/HYSTEROSCOPY WITH HYDROTHERMAL ABLATION;  Surgeon: Christophe Louis, MD;  Location[facility];  Service: Gynecology;  Laterality: N/A;   ECTOPIC PREGNANCY SURGERY  05/2009   @[facility] ;    laparoscopic turned laparotomy w/ right  salpingectomy   Patient Active Problem List   Diagnosis Date Noted   Migraines 07/31/2020   Genetic testing - OvaNext gene panel 10/09/2014   Breast cancer of upper-outer quadrant of left female breast ([place]) 08/22/2014    REFERRING DIAG: M54.50 (ICD-10-CM) - Acute right-sided low back pain without sciatica   THERAPY DIAG:  Other low back pain  Cramp and spasm  Muscle weakness (generalized)  Rationale for Evaluation and Treatment Rehabilitation  PERTINENT HISTORY: Lt breast cancer 2015 with lymph nodes, migraines  PRECAUTIONS: Other: history of breast cancer    WEIGHT BEARING RESTRICTIONS: No   FALLS:  Has patient fallen in last 6 months? 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 Doing better but still having pain with lifting and getting up from the floor.   PAIN:  Are you having pain? Yes: NPRS scale: 1/10 Pain location: Rt side of low back  Pain description: sore, dull, sometimes sharp Aggravating factors: standing too long, sitting at work for long periods, walking  Relieving factors: supine, heat, meds   OBJECTIVE: (objective measures completed at initial evaluation unless otherwise dated)   DIAGNOSTIC FINDINGS:  None    PATIENT SURVEYS:  FOTO 21 (goal is 54) 01/31/23 68   SCREENING FOR RED FLAGS: Bowel or bladder incontinence: No Spinal tumors: No Cauda equina syndrome: No Compression fracture: No Abdominal aneurysm: No   COGNITION: Overall cognitive status: Within  functional limits for tasks assessed                          SENSATION: WFL   MUSCLE LENGTH: WFLs   POSTURE: weight shift left   PALPATION: Pt with reduced segmental mobility in the lumbar spine without pain.  Tension over Rt lumbar paraspinals and QL.    LUMBAR ROM:    WFLs in all directions with pain on the Rt with extension and Rt sidebending    LOWER EXTREMITY ROM:    WFLs without pain   LOWER EXTREMITY MMT:     Bil knees 5/5, hip abduction 4+/5, extension 4+/5.   LUMBAR SPECIAL TESTS:  Slump test: Negative   GAIT: Distance walked: 50 Assistive device utilized: None Level of assistance: Complete Independence Comments: WFLs   TODAY'S TREATMENT:                                                                                                                              DATE:  01/31/23: Recumbent bike x4' L4 PT present to discuss status and plan for session Squat to mat table with 8lb chest press 2X10 Functional squat position OH reach to bil shoulder ext 10 sec hold each position x 5 reps Deadlifts worked on form then added weight 15#KB x 10 SL DL with 10# KB x 10 ea side Standing quad stretch 30 sec x 1 B, SDLY x 1 B 1/2 kneel on airex 8lb chop and reverse chop x10 each way Step back lunge with 10#KB row x 10 ea Standing march with 15#KB and 20# KB held at one side away from body 1x5 B ea Therapeutic Activities: FOTO completed   01/27/23: Recumbent bike x4' L4 PT present to discuss status and plan for session Seated fig 4 and HS stretch bil 2x20" each Variations of dying bug at 90/90 static holds to toe touch taps to arms overhead to opp arm/leg - x 10 ea Plank on hands x10" then add 8x mountain climbers PPT x 20 due to reports of increased tension. Squat to mat table with 8lb chest press x 15 1/2 kneel on airex 3lb chop  and reverse chop x10 each way Trigger Point Dry-Needling  Treatment instructions: Expect mild to moderate muscle soreness. S/S of  pneumothorax if dry needled over a lung field, and to seek immediate medical attention should they occur. Patient verbalized understanding of these instructions and education.  Patient Consent Given: No Education handout provided: Previously provided Muscles treated: Bil lumbar muiltifidi and paraspinals L3-L4 Electrical stimulation performed: No Parameters: N/A Treatment response/outcome: Reduced tension and increased mobility.  STM to L multifidi due to increased tension noted.   01/23/23: Recumbent bike x4' L4 PT present to discuss status and plan for session Seated fig 4 and HS stretch bil 2x20" each Variations of dying bug at 90/90 static holds to toe touch taps to arms overhead to opp arm/leg - x 10 ea Plank on hands x10" then add 8x mountain climbers Standing plank hands on mat table mountain climber to hip ext x 5 each Squat to mat table with 8lb chest press x 15 Standing T 8lb x10 each LE Slider bwd lunge with single 8lb kbell row x10 each  Seated on green physioball pallof press green tband 2x10 1/2 kneel on airex 3lb chop and reverse chop x10 each way SLS on airex pad 3-way clock taps 5 rounds each LE (more ankle stability than core and LE) SLS with mini squat 3-way clock taps x 3 rounds each LE (felt more in quad and hip)   01/19/23: Recumbent bike x3' L4 PT present to discuss status and plan for session Seated HS stretch bil 2x20" Seated fig 4 2x 20' Sit to stand with bil 5lb chest press 1x10, 8# 1x10 5lb squat chops x 10 bil Mountain climber x 8 ea Variations of dying bug at 90/90 static holds to toe touch taps to arms overhead to opp arm/leg - x 10 ea 90/90 with bil OH flex 8# x 10 Slider bwd lunge with single 5lb kbell row x10 each  Low pulley bil UE row with squat to stand x10, 10lb 5# lawnmower x 10 ea Supine on elbows - bil leg press x 10 Side plank with clam (feet apart) and arm reach toward ceiling x 10 ea  01/17/23: Recumbent bike x3' L4 PT present to  discuss status and plan for session Seated HS stretch bil 2x20" Reverse chair sit up 5lb 1x10 Sit to stand with bil 5lb chest press 1x10 5lb UE diagonal reverse chop with opp LE march to weight (go down in weight for UE next time) Chair plank with slow motion knee driver alt LE [id number] Variations of dying bug at 90/90 static holds to toe touch taps to arms overhead to opp arm/leg - TC and VC on knitted ribcage and pelvic tilt maintained Body mechanics for squatting in proper alignment: sternum over pubic bone using hip hinge Slider bwd lunge with single 5lb kbell row x10 each  Low pulley bil UE row with squat to stand x10, 10lb    PATIENT EDUCATION:  Education details: Access Code: [email or web address], DN handout  Person educated: Patient Education method: Explanation, Demonstration, and Handouts Education comprehension: verbalized understanding and returned demonstration   HOME EXERCISE PROGRAM: Access Code: [email or web address] URL: [email or web address] Date: 01/23/2023 Prepared by: Venetia Night Beuhring  Exercises - Supine Piriformis Stretch  - 2-3 x daily - 7 x weekly - 1 sets - 3 reps - 20 hold - Supine Lower Trunk Rotation  - 2-3 x daily - 7 x weekly - 1 sets - 3 reps - 20 hold - Seated Hamstring Stretch  - 2-3 x  daily - 7 x weekly - 1 sets - 3 reps - 20 hold - Seated Figure 4 Piriformis Stretch  - 3 x daily - 7 x weekly - 1 sets - 3 reps - 20 hold - Child's Pose with Sidebending  - 2 x daily - 7 x weekly - 1 sets - 3 reps - 20 hold - Dead Bug  - 1 x daily - 7 x weekly - 2 sets - 10 reps - Isometric Dead Bug  - 1 x daily - 7 x weekly - 1 sets - 5 reps - 5-10 sec hold - Bird Dog with Knee Taps  - 1 x daily - 7 x weekly - 2 sets - 10 reps - Squat with Chair Touch  - 1 x daily - 4 x weekly - 3 sets - 10 reps - Standard Lunge  - 1 x daily - 4 x weekly - 3 sets - 10 reps - Squat with Chair Touch  - 1 x daily - 7 x weekly - 2 sets - 10 reps - Reverse Lunge  - 1 x daily - 7 x weekly - 2 sets - 10  reps - Standing Anti-Rotation Press with Anchored Resistance  - 1 x daily - 7 x weekly - 2 sets - 10 reps - Single Leg Deadlift with Kettlebell  - 1 x daily - 7 x weekly - 1 sets - 10 reps   ASSESSMENT:   CLINICAL IMPRESSION: Sonya is progressing well and has met most of there LTGs. She still has difficulty and some pain with lifting,especially with floor to stand transfers. Her FOTO is 68 (predicted 70). She continues to demonstrate core weakness with functional exercises, but has been able to increase resistance to challenge her core more. Two new goals were set to address her specific concerns. She continues to demonstrate potential for improvement and would benefit from continued skilled therapy to address impairments.     OBJECTIVE IMPAIRMENTS: increased muscle spasms, postural dysfunction, and pain.    ACTIVITY LIMITATIONS: sitting, standing, and locomotion level   PARTICIPATION LIMITATIONS: meal prep, cleaning, and occupation   PERSONAL FACTORS: 1 comorbidity: recurrence of LBP  are also affecting patient's functional outcome.    REHAB POTENTIAL: Excellent   CLINICAL DECISION MAKING: Stable/uncomplicated   EVALUATION COMPLEXITY: Low     GOALS: Goals reviewed with patient? Yes   SHORT TERM GOALS: Target date: 02/02/2023     Be independent in initial HEP Baseline: Goal status: met   2.  Report > or = to 30% reduction in LBP with work tasks  Baseline: 2-8/10 Goal status: met   3.  Verbalize and demonstrate body mechanics modifications for lumbar protection with daily tasks  Baseline:  Goal status: met     LONG TERM GOALS: Target date: 03/02/2023     Be independent in advanced HEP Baseline:  Goal status: ongoing   2.  Improve FOTO to > or = to 70 Baseline: 49 Goal status: IN PROGRESS 01/31/23: 68   3.  Report > or = to 70% reduction in LBP with daily tasks 01/31/23 Baseline:  Goal status: MET   4.  Sit for work without limitation due to LBP  01/31/23 Baseline:  Goal status: MET   5.  Report frequent change of position with work tasks to improve muscular balance  Baseline:  Goal status: MET  6.  Able to lift items like groceries/laundry without difficulty or increased pain Baseline:  Goal status: INITIAL 01/31/23  7.  Able  to perform floor to stand transfers independently while holding items and without increased pain Baseline:  Goal status: INITIAL 01/31/23       PLAN:   PT FREQUENCY: 1-2x/week   PT DURATION: 8 weeks   PLANNED INTERVENTIONS: Therapeutic exercises, Therapeutic activity, Neuromuscular re-education, Balance training, Gait training, Patient/Family education, Self Care, Joint mobilization, Aquatic Therapy, Dry Needling, Electrical stimulation, Spinal mobilization, Cryotherapy, Moist heat, Taping, Traction, Manual therapy, and Re-evaluation.   PLAN FOR NEXT SESSION:  continue core strength and LE functional strength, focus on floor to stand transfers for functional goal   Chidi Shirer, PT  01/31/23  8:08 AM

## 2023-01-31 ENCOUNTER — Ambulatory Visit: Payer: 59 | Admitting: Physical Therapy

## 2023-01-31 ENCOUNTER — Encounter: Payer: Self-pay | Admitting: Physical Therapy

## 2023-01-31 DIAGNOSIS — M6281 Muscle weakness (generalized): Secondary | ICD-10-CM

## 2023-01-31 DIAGNOSIS — R252 Cramp and spasm: Secondary | ICD-10-CM

## 2023-01-31 DIAGNOSIS — M5459 Other low back pain: Secondary | ICD-10-CM

## 2023-01-31 NOTE — Therapy (Signed)
OUTPATIENT PHYSICAL THERAPY TREATMENT NOTE   Patient Name: Karian Simonton MRN: [id number] DOB:1976/06/08, 47 y.o., female Today's Date: 02/02/2023  PCP: Isaac Bliss, Olam Idler, MD REFERRING PROVIDER: Isaac Bliss, Olam Idler, MD  END OF SESSION:   PT End of Session - 02/02/23 0801     Visit Number 9    Date for PT Re-Evaluation 03/02/23    Authorization Type Aet[employer]    PT Start Time 0801    PT Stop Time 0842    PT Time Calculation (min) 41 min    Activity Tolerance Patient tolerated treatment well    Behavior During Therapy [facility] for tasks assessed/performed             Past Medical History:  Diagnosis Date   Endometrial mass    GERD (gastroesophageal reflux disease)    History of chemotherapy    09-19-2014  to 11/ 2016  left breast cancer   History of external beam radiation therapy    03-10-2015  to 04-27-2015  left breast cancer   Malignant neoplasm of upper-outer quadrant of left breast in female, estrogen receptor positive ([place]) 08/2014   oncologist--- dr Lindi Adie;   dx 10/ 2015;   Stage IIA,  chemo 09-19-2014  to 11/ 2016 ;   01-29-2015 left breast lumpectomy w/ sln bx;   IMRT completed 04-27-2015   Menorrhagia    Migraine without aura and without status migrainosus, not intractable    neurologist--  Ashok Pall NP   Past Surgical History:  Procedure Laterality Date   BREAST LUMPECTOMY WITH AXILLARY LYMPH NODE BIOPSY Left 01/29/2015   DILATATION & CURETTAGE/HYSTEROSCOPY WITH MYOSURE N/A 09/28/2022   Procedure: DILATATION & CURETTAGE/HYSTEROSCOPY WITH MYOSURE;  Surgeon: Christophe Louis, MD;  Location: [place];  Service: Gynecology;  Laterality: N/A;   DILITATION & CURRETTAGE/HYSTROSCOPY WITH HYDROTHERMAL ABLATION N/A 09/28/2022   Procedure: DILATATION & CURETTAGE/HYSTEROSCOPY WITH HYDROTHERMAL ABLATION;  Surgeon: Christophe Louis, MD;  Location: [facility];  Service: Gynecology;  Laterality: N/A;   ECTOPIC PREGNANCY SURGERY   05/2009   @[facility] ;    laparoscopic turned laparotomy w/ right salpingectomy   Patient Active Problem List   Diagnosis Date Noted   Migraines 07/31/2020   Genetic testing - OvaNext gene panel 10/09/2014   Breast cancer of upper-outer quadrant of left female breast ([place]) 08/22/2014    REFERRING DIAG: M54.50 (ICD-10-CM) - Acute right-sided low back pain without sciatica   THERAPY DIAG:  Other low back pain  Cramp and spasm  Muscle weakness (generalized)  Rationale for Evaluation and Treatment Rehabilitation  PERTINENT HISTORY: Lt breast cancer 2015 with lymph nodes, migraines  PRECAUTIONS: Other: history of breast cancer    WEIGHT BEARING RESTRICTIONS: No   FALLS:  Has patient fallen in last 6 months? 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 Yes: NPRS scale: 0/10 Pain location: Rt side of low back  Pain description: sore, dull, sometimes sharp Aggravating factors: standing too long, sitting at work for long periods, walking  Relieving factors: supine, heat, meds   OBJECTIVE: (objective measures completed at initial evaluation unless otherwise dated)   DIAGNOSTIC FINDINGS:  None    PATIENT SURVEYS:  FOTO 60 (goal is 40) 01/31/23 68   SCREENING FOR RED FLAGS: Bowel or bladder incontinence: No Spinal tumors: No Cauda equina syndrome: No Compression fracture: No Abdominal aneurysm: No   COGNITION: Overall cognitive status: Within functional limits for tasks assessed                           SENSATION: WFL   MUSCLE LENGTH: WFLs   POSTURE: weight shift left   PALPATION: Pt with reduced segmental mobility in the lumbar spine without pain.  Tension over Rt lumbar paraspinals and QL.    LUMBAR ROM:    WFLs in all directions with pain on the Rt with extension and Rt sidebending    LOWER EXTREMITY ROM:    WFLs without pain   LOWER EXTREMITY MMT:     Bil knees 5/5, hip abduction 4+/5, extension 4+/5.   LUMBAR SPECIAL TESTS:  Slump test: Negative   GAIT: Distance walked: 50 Assistive device utilized: None Level of assistance: Complete Independence Comments: WFLs   TODAY'S TREATMENT:                                                                                                                              DATE:  02/02/23: Recumbent bike x4' L4 PT present to discuss status and plan for session Squat to mat table with 8lb chest press 2X10 Functional squat position OH reach to bil shoulder ext 10 sec hold each position x 5 reps Deadlifts 15#KB 2 x 10 SL DL with 10# KB x 10 ea side Tall kneel to stand with OH press 5# x 10 B 1/4 turkish get up x 10 ea 1/2 kneel on airex 8lb chop and reverse chop x10 each way Step back lunge with 10#KB row x 10 ea Standing march with 15#KB and 20# KB held at one side away from body 1x5 B ea SDLY  quad stretch 1x30sec B Open book x 5 ea Thread the needle x 3  01/31/23: Recumbent bike x4' L4 PT present to discuss status and plan for session Squat to mat table with 8lb chest press 2X10 Functional squat position OH reach to bil shoulder ext 10 sec hold each position x 5 reps Deadlifts worked on form then added weight 15#KB x 10 SL DL with 10# KB x 10 ea side Standing quad stretch 30 sec x 1 B, SDLY x 1 B 1/2 kneel on airex 8lb chop and reverse chop x10 each way Step back  lunge with 10#KB row x 10 ea Standing march with 15#KB and 20# KB held at one side away from body 1x5 B ea Therapeutic Activities: FOTO  completed   01/27/23: Recumbent bike x4' L4 PT present to discuss status and plan for session Seated fig 4 and HS stretch bil 2x20" each Variations of dying bug at 90/90 static holds to toe touch taps to arms overhead to opp arm/leg - x 10 ea Plank on hands x10" then add 8x mountain climbers PPT x 20 due to reports of increased tension. Squat to mat table with 8lb chest press x 15 1/2 kneel on airex 3lb chop and reverse chop x10 each way Trigger Point Dry-Needling  Treatment instructions: Expect mild to moderate muscle soreness. S/S of pneumothorax if dry needled over a lung field, and to seek immediate medical attention should they occur. Patient verbalized understanding of these instructions and education.  Patient Consent Given: No Education handout provided: Previously provided Muscles treated: Bil lumbar muiltifidi and paraspinals L3-L4 Electrical stimulation performed: No Parameters: N/A Treatment response/outcome: Reduced tension and increased mobility.  STM to L multifidi due to increased tension noted.   01/23/23: Recumbent bike x4' L4 PT present to discuss status and plan for session Seated fig 4 and HS stretch bil 2x20" each Variations of dying bug at 90/90 static holds to toe touch taps to arms overhead to opp arm/leg - x 10 ea Plank on hands x10" then add 8x mountain climbers Standing plank hands on mat table mountain climber to hip ext x 5 each Squat to mat table with 8lb chest press x 15 Standing T 8lb x10 each LE Slider bwd lunge with single 8lb kbell row x10 each  Seated on green physioball pallof press green tband 2x10 1/2 kneel on airex 3lb chop and reverse chop x10 each way SLS on airex pad 3-way clock taps 5 rounds each LE (more ankle stability than core and LE) SLS with mini squat 3-way clock taps x 3 rounds each LE (felt more in quad and hip)  PATIENT EDUCATION:  Education details: Access Code: [email or web address], DN handout  Person educated: Patient Education method:  Explanation, Demonstration, and Handouts Education comprehension: verbalized understanding and returned demonstration   HOME EXERCISE PROGRAM: Access Code: [email or web address] URL: [email or web address] Date: 01/23/2023 Prepared by: Venetia Night Beuhring  Exercises - Supine Piriformis Stretch  - 2-3 x daily - 7 x weekly - 1 sets - 3 reps - 20 hold - Supine Lower Trunk Rotation  - 2-3 x daily - 7 x weekly - 1 sets - 3 reps - 20 hold - Seated Hamstring Stretch  - 2-3 x daily - 7 x weekly - 1 sets - 3 reps - 20 hold - Seated Figure 4 Piriformis Stretch  - 3 x daily - 7 x weekly - 1 sets - 3 reps - 20 hold - Child's Pose with Sidebending  - 2 x daily - 7 x weekly - 1 sets - 3 reps - 20 hold - Dead Bug  - 1 x daily - 7 x weekly - 2 sets - 10 reps - Isometric Dead Bug  - 1 x daily - 7 x weekly - 1 sets - 5 reps - 5-10 sec hold - Bird Dog with Knee Taps  - 1 x daily - 7 x weekly - 2 sets - 10 reps - Squat with Chair Touch  - 1 x daily - 4 x weekly - 3 sets - 10 reps - Standard Lunge  - 1 x  daily - 4 x weekly - 3 sets - 10 reps - Squat with Chair Touch  - 1 x daily - 7 x weekly - 2 sets - 10 reps - Reverse Lunge  - 1 x daily - 7 x weekly - 2 sets - 10 reps - Standing Anti-Rotation Press with Anchored Resistance  - 1 x daily - 7 x weekly - 2 sets - 10 reps - Single Leg Deadlift with Kettlebell  - 1 x daily - 7 x weekly - 1 sets - 10 reps   ASSESSMENT:   CLINICAL IMPRESSION: Kollyns reports no pain today. She did very well with strengthening with minimal cues required. Tall kneel to stand and SL RDL's remain difficult.     OBJECTIVE IMPAIRMENTS: increased muscle spasms, postural dysfunction, and pain.    ACTIVITY LIMITATIONS: sitting, standing, and locomotion level   PARTICIPATION LIMITATIONS: meal prep, cleaning, and occupation   PERSONAL FACTORS: 1 comorbidity: recurrence of LBP  are also affecting patient's functional outcome.    REHAB POTENTIAL: Excellent   CLINICAL DECISION MAKING:  Stable/uncomplicated   EVALUATION COMPLEXITY: Low     GOALS: Goals reviewed with patient? Yes   SHORT TERM GOALS: Target date: 02/02/2023     Be independent in initial HEP Baseline: Goal status: met   2.  Report > or = to 30% reduction in LBP with work tasks  Baseline: 2-8/10 Goal status: met   3.  Verbalize and demonstrate body mechanics modifications for lumbar protection with daily tasks  Baseline:  Goal status: met     LONG TERM GOALS: Target date: 03/02/2023     Be independent in advanced HEP Baseline:  Goal status: ongoing   2.  Improve FOTO to > or = to 70 Baseline: 49 Goal status: IN PROGRESS 01/31/23: 68   3.  Report > or = to 70% reduction in LBP with daily tasks 01/31/23 Baseline:  Goal status: MET   4.  Sit for work without limitation due to LBP 01/31/23 Baseline:  Goal status: MET   5.  Report frequent change of position with work tasks to improve muscular balance  Baseline:  Goal status: MET  6.  Able to lift items like groceries/laundry without difficulty or increased pain Baseline:  Goal status: INITIAL 01/31/23  7.  Able to perform floor to stand transfers independently while holding items and without increased pain Baseline:  Goal status: INITIAL 01/31/23       PLAN:   PT FREQUENCY: 1-2x/week   PT DURATION: 8 weeks   PLANNED INTERVENTIONS: Therapeutic exercises, Therapeutic activity, Neuromuscular re-education, Balance training, Gait training, Patient/Family education, Self Care, Joint mobilization, Aquatic Therapy, Dry Needling, Electrical stimulation, Spinal mobilization, Cryotherapy, Moist heat, Taping, Traction, Manual therapy, and Re-evaluation.   PLAN FOR NEXT SESSION:  continue core strength and LE functional strength, focus on floor to stand transfers for functional goal   Kynadi Dragos, PT  02/02/23  8:45 AM

## 2023-02-02 ENCOUNTER — Ambulatory Visit: Payer: 59 | Admitting: Physical Therapy

## 2023-02-02 ENCOUNTER — Encounter: Payer: Self-pay | Admitting: Physical Therapy

## 2023-02-02 DIAGNOSIS — M6281 Muscle weakness (generalized): Secondary | ICD-10-CM

## 2023-02-02 DIAGNOSIS — M5459 Other low back pain: Secondary | ICD-10-CM | POA: Diagnosis not present

## 2023-02-02 DIAGNOSIS — R252 Cramp and spasm: Secondary | ICD-10-CM

## 2023-02-07 ENCOUNTER — Ambulatory Visit: Payer: 59 | Admitting: Physical Therapy

## 2023-02-08 NOTE — Therapy (Signed)
OUTPATIENT PHYSICAL THERAPY TREATMENT NOTE   Patient Name: Kim Armstrong MRN: [id number] DOB:[birth year], 47 y.o., female Today's Date: 02/09/2023  PCP: Isaac Bliss, Olam Idler, MD REFERRING PROVIDER: Isaac Bliss, Olam Idler, MD  END OF SESSION:   PT End of Session - 02/09/23 0803     Visit Number 10    Date for PT Re-Evaluation 03/02/23    Authorization Type Aetna    PT Start Time 0803    PT Stop Time 0842    PT Time Calculation (min) 39 min    Activity Tolerance Patient tolerated treatment well    Behavior During Therapy [facility] for tasks assessed/performed              Past Medical History:  Diagnosis Date   Endometrial mass    GERD (gastroesophageal reflux disease)    History of chemotherapy    09-19-2014  to 11/ 2016  left breast cancer   History of external beam radiation therapy    03-10-2015  to 04-27-2015  left breast cancer   Malignant neoplasm of upper-outer quadrant of left breast in female, 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   oncologist--- dr Lindi Adie;   dx 10/ 2015;   Stage IIA,  chemo 09-19-2014  to 11/ 2016 ;   01-29-2015 left breast lumpectomy w/ sln bx;   IMRT completed 04-27-2015   Menorrhagia    Migraine without aura and without status migrainosus, not intractable    neurologist--  Ashok Pall NP   Past Surgical History:  Procedure Laterality Date   BREAST LUMPECTOMY WITH AXILLARY LYMPH NODE BIOPSY Left 01/29/2015   DILATATION & CURETTAGE/HYSTEROSCOPY WITH MYOSURE N/A 09/28/2022   Procedure: DILATATION & CURETTAGE/HYSTEROSCOPY WITH MYOSURE;  Surgeon: Christophe Louis, MD;  Location: [place];  Service: Gynecology;  Laterality: N/A;   DILITATION & CURRETTAGE/HYSTROSCOPY WITH HYDROTHERMAL ABLATION N/A 09/28/2022   Procedure: DILATATION & CURETTAGE/HYSTEROSCOPY WITH HYDROTHERMAL ABLATION;  Surgeon: Christophe Louis, MD;  Location[facility];  Service: Gynecology;  Laterality: N/A;   ECTOPIC PREGNANCY SURGERY  05/2009    @[facility] ;    laparoscopic turned laparotomy w/ right salpingectomy   Patient Active Problem List   Diagnosis Date Noted   Migraines 07/31/2020   Genetic testing - OvaNext gene panel 10/09/2014   Breast cancer of upper-outer quadrant of left female breast 08/22/2014    REFERRING DIAG: M54.50 (ICD-10-CM) - Acute right-sided low back pain without sciatica   THERAPY DIAG:  Other low back pain  Cramp and spasm  Muscle weakness (generalized)  Rationale for Evaluation and Treatment Rehabilitation  PERTINENT HISTORY: Lt breast cancer 2015 with lymph nodes, migraines  PRECAUTIONS: Other: history of breast cancer    WEIGHT BEARING RESTRICTIONS: No   FALLS:  Has patient fallen in last 6 months? 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 No problems with the travelling.  PAIN:  Are you having pain? Yes: NPRS scale: 0/10 Pain location: Rt side of low back  Pain description: sore, dull, sometimes sharp Aggravating factors: standing too long, sitting at work for long periods, walking  Relieving factors: supine, heat, meds   OBJECTIVE: (objective measures completed at initial evaluation unless otherwise dated)   DIAGNOSTIC FINDINGS:  None    PATIENT SURVEYS:  FOTO 39 (goal is 72) 01/31/23 68   SCREENING FOR RED FLAGS: Bowel or bladder incontinence: No Spinal tumors: No Cauda equina syndrome: No Compression fracture: No Abdominal aneurysm: No   COGNITION: Overall cognitive  status: Within functional limits for tasks assessed                          SENSATION: WFL   MUSCLE LENGTH: WFLs   POSTURE: weight shift left   PALPATION: Pt with reduced segmental mobility in the lumbar spine without pain.  Tension over Rt lumbar paraspinals and QL.    LUMBAR ROM:    WFLs in all directions with pain on the Rt with extension and Rt sidebending    LOWER EXTREMITY ROM:    WFLs without pain   LOWER EXTREMITY MMT:     Bil knees 5/5, hip abduction 4+/5, extension 4+/5.   LUMBAR SPECIAL TESTS:  Slump test: Negative   GAIT: Distance walked: 50 Assistive device utilized: None Level of assistance: Complete Independence Comments: WFLs   TODAY'S TREATMENT:                                                                                                                              DATE:  02/09/23: Recumbent bike x 4 L4 PT present to discuss status and plan for session Deadlifts 20# KB 2 x 10 SL DL with 10# KB x 10 ea side at counter Tall kneel to stand with OH press 5# x 10 B Standing chops 5# x10 each way Step back lunge with 10# each hand 2 x 5 ea Standing march with 20# KB held at one side away from body 1x10 B ea, then on foam pad 1x10 ea Walking with 10# KB OH in one arm done Bil, 2 laps of long hallway ea Sumo squat 10# x 10 (feels in feet) Heel drop off step x 30 sec ea, soleus stretch x 30 sec ea and plantar fascia stretch (deep squat) x 30 sec Seated fig 4 2x 30 sec ea  02/02/23: Recumbent bike x4' L4 PT present to discuss status and plan for session Squat to mat table with 8lb chest press 2X10 Functional squat position OH reach to bil shoulder ext 10 sec hold each position x 5 reps Deadlifts 15#KB 2 x 10 SL DL with 10# KB x 10 ea side Tall kneel to stand with [facility] press 5# x  10 B 1/4 turkish get up x 10 ea 1/2 kneel on airex 8lb chop and reverse chop x10 each way Step back lunge with 10#KB row x 10 ea Standing march with 15#KB and 20# KB held at one  side away from body 1x5 B ea SDLY  quad stretch 1x30sec B Open book x 5 ea Thread the needle x 3  01/31/23: Recumbent bike x4' L4 PT present to discuss status and plan for session Squat to mat table with 8lb chest press 2X10 Functional squat position OH reach to bil shoulder ext 10 sec hold each position x 5 reps Deadlifts worked on form then added weight 15#KB x 10 SL DL with 10# KB x 10 ea side Standing quad stretch 30 sec x 1 B, SDLY x 1 B 1/2 kneel on airex 8lb chop and reverse chop x10 each way Step back lunge with 10#KB row x 10 ea Standing march with 15#KB and 20# KB held at one side away from body 1x5 B ea Therapeutic Activities: FOTO completed   PATIENT EDUCATION:  Education details: Access Code: [email or web address], DN handout  Person educated: Patient Education method: Explanation, Demonstration, and Handouts Education comprehension: verbalized understanding and returned demonstration   HOME EXERCISE PROGRAM: Access Code: [email or web address] URL: [email or web address] Date: 01/23/2023 Prepared by: Venetia Night Beuhring  Exercises - Supine Piriformis Stretch  - 2-3 x daily - 7 x weekly - 1 sets - 3 reps - 20 hold - Supine Lower Trunk Rotation  - 2-3 x daily - 7 x weekly - 1 sets - 3 reps - 20 hold - Seated Hamstring Stretch  - 2-3 x daily - 7 x weekly - 1 sets - 3 reps - 20 hold - Seated Figure 4 Piriformis Stretch  - 3 x daily - 7 x weekly - 1 sets - 3 reps - 20 hold - Child's Pose with Sidebending  - 2 x daily - 7 x weekly - 1 sets - 3 reps - 20 hold - Dead Bug  - 1 x daily - 7 x weekly - 2 sets - 10 reps - Isometric Dead Bug  - 1 x daily - 7 x weekly - 1 sets - 5 reps - 5-10 sec hold - Bird Dog with Knee Taps  - 1 x daily - 7 x weekly - 2 sets - 10 reps - Squat with Chair Touch  - 1 x daily - 4 x weekly - 3 sets - 10 reps - Standard Lunge  - 1 x daily - 4 x weekly - 3 sets - 10 reps - Squat with Chair Touch  - 1 x daily - 7 x weekly - 2 sets - 10 reps - Reverse Lunge  - 1 x  daily - 7 x weekly - 2 sets - 10 reps - Standing Anti-Rotation Press with Anchored Resistance  - 1 x daily - 7 x weekly - 2 sets - 10 reps - Single Leg Deadlift with Kettlebell  - 1 x daily - 7 x weekly - 1 sets - 10 reps   ASSESSMENT:   CLINICAL IMPRESSION: Obdulia continues to improve and would like to drop to one visit per week. She had some "tweaking" in R low back mid way through TE today, but overall no pain. She demonstrates glute med weakness on the right with gait, but reports greater weakness on L LE with floor to stand TE.     OBJECTIVE IMPAIRMENTS: increased muscle spasms, postural dysfunction, and pain.    ACTIVITY LIMITATIONS: sitting,  standing, and locomotion level   PARTICIPATION LIMITATIONS: meal prep, cleaning, and occupation   PERSONAL FACTORS: 1 comorbidity: recurrence of LBP  are also affecting patient's functional outcome.    REHAB POTENTIAL: Excellent   CLINICAL DECISION MAKING: Stable/uncomplicated   EVALUATION COMPLEXITY: Low     GOALS: Goals reviewed with patient? Yes   SHORT TERM GOALS: Target date: 02/02/2023     Be independent in initial HEP Baseline: Goal status: met   2.  Report > or = to 30% reduction in LBP with work tasks  Baseline: 2-8/10 Goal status: met   3.  Verbalize and demonstrate body mechanics modifications for lumbar protection with daily tasks  Baseline:  Goal status: met     LONG TERM GOALS: Target date: 03/02/2023     Be independent in advanced HEP Baseline:  Goal status: ongoing   2.  Improve FOTO to > or = to 70 Baseline: 49 Goal status: IN PROGRESS 01/31/23: 68   3.  Report > or = to 70% reduction in LBP with daily tasks 01/31/23 Baseline:  Goal status: MET   4.  Sit for work without limitation due to LBP 01/31/23 Baseline:  Goal status: MET   5.  Report frequent change of position with work tasks to improve muscular balance  Baseline:  Goal status: MET  6.  Able to lift items like groceries/laundry  without difficulty or increased pain Baseline:  Goal status: INITIAL 01/31/23  7.  Able to perform floor to stand transfers independently while holding items and without increased pain Baseline:  Goal status: INITIAL 01/31/23       PLAN:   PT FREQUENCY: 1-2x/week   PT DURATION: 8 weeks   PLANNED INTERVENTIONS: Therapeutic exercises, Therapeutic activity, Neuromuscular re-education, Balance training, Gait training, Patient/Family education, Self Care, Joint mobilization, Aquatic Therapy, Dry Needling, Electrical stimulation, Spinal mobilization, Cryotherapy, Moist heat, Taping, Traction, Manual therapy, and Re-evaluation.   PLAN FOR NEXT SESSION:  continue core strength and LE functional strength, focus on floor to stand transfers for functional goal   Ardath Lepak, PT  02/09/23  8:43 AM

## 2023-02-09 ENCOUNTER — Ambulatory Visit: Payer: 59 | Attending: Internal Medicine | Admitting: Physical Therapy

## 2023-02-09 ENCOUNTER — Encounter: Payer: Self-pay | Admitting: Physical Therapy

## 2023-02-09 DIAGNOSIS — R252 Cramp and spasm: Secondary | ICD-10-CM | POA: Diagnosis present

## 2023-02-09 DIAGNOSIS — M5459 Other low back pain: Secondary | ICD-10-CM | POA: Diagnosis present

## 2023-02-09 DIAGNOSIS — M6281 Muscle weakness (generalized): Secondary | ICD-10-CM

## 2023-02-14 ENCOUNTER — Ambulatory Visit: Payer: 59 | Admitting: Physical Therapy

## 2023-02-16 ENCOUNTER — Ambulatory Visit: Payer: 59

## 2023-02-16 DIAGNOSIS — M6281 Muscle weakness (generalized): Secondary | ICD-10-CM

## 2023-02-16 DIAGNOSIS — R252 Cramp and spasm: Secondary | ICD-10-CM

## 2023-02-16 DIAGNOSIS — M5459 Other low back pain: Secondary | ICD-10-CM

## 2023-02-16 NOTE — Therapy (Addendum)
OUTPATIENT PHYSICAL THERAPY TREATMENT NOTE   Patient Name: Kim Armstrong MRN: [id number] DOB:04/11/1976, 47 y.o., female Today's Date: 02/16/2023  PCP: Philip Aspen, Minerva Ends, MD REFERRING PROVIDER: Philip Aspen, Minerva Ends, MD  END OF SESSION:   PT End of Session - 02/16/23 0931     Visit Number 11    Date for PT Re-Evaluation 03/02/23    Authorization Type [employer]    PT Start Time 0806    PT Stop Time 0844    PT Time Calculation (min) 38 min    Activity Tolerance Patient tolerated treatment well    Behavior During Therapy [facility] for tasks assessed/performed               Past Medical History:  Diagnosis Date   Endometrial mass    GERD (gastroesophageal reflux disease)    History of chemotherapy    09-19-2014  to 11/ 2016  left breast cancer   History of external beam radiation therapy    03-10-2015  to 04-27-2015  left breast cancer   Malignant neoplasm of upper-outer quadrant of left breast in female, 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   oncologist--- dr Pamelia Hoit;   dx 10/ 2015;   Stage IIA,  chemo 09-19-2014  to 11/ 2016 ;   01-29-2015 left breast lumpectomy w/ sln bx;   IMRT completed 04-27-2015   Menorrhagia    Migraine without aura and without status migrainosus, not intractable    neurologist--  Anastasia Fiedler NP   Past Surgical History:  Procedure Laterality Date   BREAST LUMPECTOMY WITH AXILLARY LYMPH NODE BIOPSY Left 01/29/2015   DILATATION & CURETTAGE/HYSTEROSCOPY WITH MYOSURE N/A 09/28/2022   Procedure: DILATATION & CURETTAGE/HYSTEROSCOPY WITH MYOSURE;  Surgeon: Gerald Leitz, MD;  Location: [facility] [place];  Service: Gynecology;  Laterality: N/A;   DILITATION & CURRETTAGE/HYSTROSCOPY WITH HYDROTHERMAL ABLATION N/A 09/28/2022   Procedure: DILATATION & CURETTAGE/HYSTEROSCOPY WITH HYDROTHERMAL ABLATION;  Surgeon: Gerald Leitz, MD;  Location: [facility];  Service: Gynecology;  Laterality: N/A;   ECTOPIC PREGNANCY SURGERY   05/2009   @[facility] ;    laparoscopic turned laparotomy w/ right salpingectomy   Patient Active Problem List   Diagnosis Date Noted   Migraines 07/31/2020   Genetic testing - OvaNext gene panel 10/09/2014   Breast cancer of upper-outer quadrant of left female breast 08/22/2014    REFERRING DIAG: M54.50 (ICD-10-CM) - Acute right-sided low back pain without sciatica   THERAPY DIAG:  Other low back pain  Cramp and spasm  Muscle weakness (generalized)  Rationale for Evaluation and Treatment Rehabilitation  PERTINENT HISTORY: Lt breast cancer 2015 with lymph nodes, migraines  PRECAUTIONS: Other: history of breast cancer    WEIGHT BEARING RESTRICTIONS: No   FALLS:  Has patient fallen in last 6 months? 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  I feel 80% better since the start of care.   PAIN:  Are you having pain? Yes: NPRS scale: 4/10 Pain location: Rt side of low back  Pain description: sore, dull, sometimes sharp Aggravating factors: standing too long, sitting at work for long periods, walking, sometimes no pattern.  Relieving factors: supine, heat, meds   OBJECTIVE: (objective measures completed at initial evaluation unless otherwise dated)   DIAGNOSTIC FINDINGS:  None    PATIENT SURVEYS:  FOTO 49 (goal is 20) 01/31/23 68   SCREENING FOR RED FLAGS: Bowel or bladder incontinence: No Spinal tumors: No Cauda equina syndrome: No Compression fracture: No Abdominal aneurysm: No    COGNITION: Overall cognitive status: Within functional limits for tasks assessed                          SENSATION: WFL   MUSCLE LENGTH: WFLs   POSTURE: weight shift left   PALPATION: Pt with reduced segmental mobility in the lumbar spine without pain.  Tension over Rt lumbar paraspinals and QL.    LUMBAR ROM:    WFLs in all directions with pain on the Rt with extension and Rt sidebending    LOWER EXTREMITY ROM:    WFLs without pain   LOWER EXTREMITY MMT:     Bil knees 5/5, hip abduction 4+/5, extension 4+/5.   LUMBAR SPECIAL TESTS:  Slump test: Negative   GAIT: Distance walked: 50 Assistive device utilized: None Level of assistance: Complete Independence Comments: WFLs   TODAY'S TREATMENT:                                                                                                                              DATE:  02/16/23: Recumbent bike x 4 L4 PT present to discuss status and plan for session Deadlifts 20# KB 2 x 10 SL DL with 29# KB x 10 ea side at counter Tall kneel to stand with OH press 5# x 10 B Standing chops 5# x10 each way- standing on balance pad Step back lunge with 10# each hand 2 x 10 ea Standing march with 20# KB held at one side away from body 1x10 B ea, then on foam pad 1x10 ea Walking with 10# KB OH in one arm done Bil, down hallway and around cancer rehab gym Sumo squat 10# x 10  Pallof press: black 2x10 bil  Heel drop off step x 30 sec ea, soleus stretch x 30 sec ea and plantar fascia stretch (deep squat) x 30 sec Seated fig 4 2x 30 sec ea Seated hamstring stretch 3x20 seconds  02/09/23: Recumbent bike x 4 L4 PT present to discuss status and plan for session Deadlifts 20# KB 2 x 10 SL DL with 56# KB x 10 ea side at counter Tall kneel to stand with OH press 5# x 10 B  Standing chops 5# x10 each way Step back lunge with 10# each hand 2 x 5 ea Standing march with 20# KB held at one side away from body 1x10 B ea, then on foam pad 1x10  ea Walking with 10# KB OH in one arm done Bil, 2 laps of long hallway ea Sumo squat 10# x 10 (feels in feet) Heel drop off step x 30 sec ea, soleus stretch x 30 sec ea and plantar fascia stretch (deep squat) x 30 sec Seated fig 4 2x 30 sec ea  02/02/23: Recumbent bike x4' L4 PT present to discuss status and plan for session Squat to mat table with 8lb chest press 2X10 Functional squat position OH reach to bil shoulder ext 10 sec hold each position x 5 reps Deadlifts 15#KB 2 x 10 SL DL with 16# KB x 10 ea side Tall kneel to stand with OH press 5# x 10 B 1/4 turkish get up x 10 ea 1/2 kneel on airex 8lb chop and reverse chop x10 each way Step back lunge with 10#KB row x 10 ea Standing march with 15#KB and 20# KB held at one side away from body 1x5 B ea SDLY  quad stretch 1x30sec B Open book x 5 ea Thread the needle x 3  PATIENT EDUCATION:  Education details: Access Code: [email or web address], DN handout  Person educated: Patient Education method: Explanation, Demonstration, and Handouts Education comprehension: verbalized understanding and returned demonstration   HOME EXERCISE PROGRAM: Access Code: [email or web address] URL: [email or web address] Date: 01/23/2023 Prepared by: Loistine Simas Beuhring  Exercises - Supine Piriformis Stretch  - 2-3 x daily - 7 x weekly - 1 sets - 3 reps - 20 hold - Supine Lower Trunk Rotation  - 2-3 x daily - 7 x weekly - 1 sets - 3 reps - 20 hold - Seated Hamstring Stretch  - 2-3 x daily - 7 x weekly - 1 sets - 3 reps - 20 hold - Seated Figure 4 Piriformis Stretch  - 3 x daily - 7 x weekly - 1 sets - 3 reps - 20 hold - Child's Pose with Sidebending  - 2 x daily - 7 x weekly - 1 sets - 3 reps - 20 hold - Dead Bug  - 1 x daily - 7 x weekly - 2 sets - 10 reps - Isometric Dead Bug  - 1 x daily - 7 x weekly - 1 sets - 5 reps - 5-10 sec hold - Bird Dog with Knee Taps  - 1 x daily - 7 x weekly - 2 sets - 10 reps - Squat with Chair Touch  - 1 x daily - 4 x weekly - 3 sets  - 10 reps - Standard Lunge  - 1 x daily - 4 x weekly - 3 sets - 10 reps - Squat with Chair Touch  - 1 x daily - 7 x weekly - 2 sets - 10 reps - Reverse Lunge  - 1 x daily - 7 x weekly - 2 sets - 10 reps - Standing Anti-Rotation Press with Anchored Resistance  - 1 x daily - 7 x weekly - 2 sets - 10 reps - Single Leg Deadlift with Kettlebell  - 1 x daily - 7 x weekly - 1 sets - 10 reps   ASSESSMENT:   CLINICAL IMPRESSION: Pt reports some increased pain today due to a mild flare-up.  Pt reports 80% overall improvement in symptoms since the start of care.  Pt is able to correct her  pelvis with forward T motion.  Pt with some mid back pain with standing rotation with weight.  She continues to work on core strength and lifting weight from the floor.  PT provided verbal and tactile cues for alignment.  Patient will benefit from skilled PT to address the below impairments and improve overall function.    OBJECTIVE IMPAIRMENTS: increased muscle spasms, postural dysfunction, and pain.    ACTIVITY LIMITATIONS: sitting, standing, and locomotion level   PARTICIPATION LIMITATIONS: meal prep, cleaning, and occupation   PERSONAL FACTORS: 1 comorbidity: recurrence of LBP  are also affecting patient's functional outcome.    REHAB POTENTIAL: Excellent   CLINICAL DECISION MAKING: Stable/uncomplicated   EVALUATION COMPLEXITY: Low     GOALS: Goals reviewed with patient? Yes   SHORT TERM GOALS: Target date: 02/02/2023     Be independent in initial HEP Baseline: Goal status: met   2.  Report > or = to 30% reduction in LBP with work tasks  Baseline: 80% (02/16/23) Goal status: met   3.  Verbalize and demonstrate body mechanics modifications for lumbar protection with daily tasks  Baseline:  Goal status: met     LONG TERM GOALS: Target date: 03/02/2023     Be independent in advanced HEP Baseline:  Goal status: ongoing   2.  Improve FOTO to > or = to 70 Baseline: 49 Goal status: IN PROGRESS  01/31/23: 68   3.  Report > or = to 70% reduction in LBP with daily tasks 01/31/23 Baseline: 80% (02/16/23) Goal status: MET   4.  Sit for work without limitation due to LBP 01/31/23 Baseline:  Goal status: MET   5.  Report frequent change of position with work tasks to improve muscular balance  Baseline:  Goal status: MET  6.  Able to lift items like groceries/laundry without difficulty or increased pain Baseline:  Goal status: INITIAL 01/31/23  7.  Able to perform floor to stand transfers independently while holding items and without increased pain Baseline:  addressing this with exercise (02/16/23) Goal status: IN PROGRESS        PLAN:   PT FREQUENCY: 1-2x/week   PT DURATION: 8 weeks   PLANNED INTERVENTIONS: Therapeutic exercises, Therapeutic activity, Neuromuscular re-education, Balance training, Gait training, Patient/Family education, Self Care, Joint mobilization, Aquatic Therapy, Dry Needling, Electrical stimulation, Spinal mobilization, Cryotherapy, Moist heat, Taping, Traction, Manual therapy, and Re-evaluation.   PLAN FOR NEXT SESSION:  continue core strength and LE functional strength, focus on floor to stand transfers for functional goal   Lorrene Reid, PT 02/16/23 9:31 AM   PHYSICAL THERAPY DISCHARGE SUMMARY  Visits from Start of Care: 11  Current functional level related to goals / functional outcomes: See above for most current status.  Pt didn't return to PT   Remaining deficits: See above for most current status.    Education / Equipment: HEP, Estate manager/land agent    Patient agrees to discharge. Patient goals were partially met. Patient is being discharged due to not returning since the last visit.  Lorrene Reid, PT 03/26/23 2:57 PM

## 2023-02-21 ENCOUNTER — Encounter: Payer: 59 | Admitting: Physical Therapy

## 2023-02-23 ENCOUNTER — Ambulatory Visit: Payer: 59 | Admitting: Physical Therapy

## 2023-04-06 ENCOUNTER — Encounter: Payer: Self-pay | Admitting: Internal Medicine

## 2023-04-06 ENCOUNTER — Ambulatory Visit (INDEPENDENT_AMBULATORY_CARE_PROVIDER_SITE_OTHER): Payer: 59 | Admitting: Internal Medicine

## 2023-04-06 VITALS — BP 120/80 | HR 76 | Temp 98.3°F | Ht 68.0 in | Wt 179.0 lb

## 2023-04-06 DIAGNOSIS — G43909 Migraine, unspecified, not intractable, without status migrainosus: Secondary | ICD-10-CM

## 2023-04-06 DIAGNOSIS — Z1211 Encounter for screening for malignant neoplasm of colon: Secondary | ICD-10-CM | POA: Diagnosis not present

## 2023-04-06 DIAGNOSIS — E559 Vitamin D deficiency, unspecified: Secondary | ICD-10-CM | POA: Insufficient documentation

## 2023-04-06 DIAGNOSIS — Z0000 Encounter for general adult medical examination without abnormal findings: Secondary | ICD-10-CM | POA: Diagnosis not present

## 2023-04-06 DIAGNOSIS — E785 Hyperlipidemia, unspecified: Secondary | ICD-10-CM | POA: Insufficient documentation

## 2023-04-06 DIAGNOSIS — Z114 Encounter for screening for human immunodeficiency virus [HIV]: Secondary | ICD-10-CM

## 2023-04-06 DIAGNOSIS — Z1159 Encounter for screening for other viral diseases: Secondary | ICD-10-CM

## 2023-04-06 LAB — CBC WITH DIFFERENTIAL/PLATELET
Basophils Absolute: 0 10*3/uL (ref 0.0–0.1)
Basophils Relative: 1 % (ref 0.0–3.0)
Eosinophils Absolute: 0.2 10*3/uL (ref 0.0–0.7)
Eosinophils Relative: 5.4 % — ABNORMAL HIGH (ref 0.0–5.0)
HCT: 38.9 % (ref 36.0–46.0)
Hemoglobin: 13 g/dL (ref 12.0–15.0)
Lymphocytes Relative: 24.5 % (ref 12.0–46.0)
Lymphs Abs: 0.9 10*3/uL (ref 0.7–4.0)
MCHC: 33.5 g/dL (ref 30.0–36.0)
MCV: 87.6 fl (ref 78.0–100.0)
Monocytes Absolute: 0.4 10*3/uL (ref 0.1–1.0)
Monocytes Relative: 9.9 % (ref 3.0–12.0)
Neutro Abs: 2.2 10*3/uL (ref 1.4–7.7)
Neutrophils Relative %: 59.2 % (ref 43.0–77.0)
Platelets: 246 10*3/uL (ref 150.0–400.0)
RBC: 4.44 Mil/uL (ref 3.87–5.11)
RDW: 13.7 % (ref 11.5–15.5)
WBC: 3.7 10*3/uL — ABNORMAL LOW (ref 4.0–10.5)

## 2023-04-06 LAB — COMPREHENSIVE METABOLIC PANEL
ALT: 12 U/L (ref 0–35)
AST: 13 U/L (ref 0–37)
Albumin: 4.3 g/dL (ref 3.5–5.2)
Alkaline Phosphatase: 49 U/L (ref 39–117)
BUN: 16 mg/dL (ref 6–23)
CO2: 24 mEq/L (ref 19–32)
Calcium: 9.4 mg/dL (ref 8.4–10.5)
Chloride: 106 mEq/L (ref 96–112)
Creatinine, Ser: 0.82 mg/dL (ref 0.40–1.20)
GFR: 84.7 mL/min (ref >60.00)
Glucose, Bld: 101 mg/dL — ABNORMAL HIGH (ref 70–99)
Potassium: 4.3 mEq/L (ref 3.5–5.1)
Sodium: 138 mEq/L (ref 135–145)
Total Bilirubin: 0.3 mg/dL (ref 0.2–1.2)
Total Protein: 7.1 g/dL (ref 6.0–8.3)

## 2023-04-06 LAB — LIPID PANEL
Cholesterol: 210 mg/dL — ABNORMAL HIGH (ref 0–200)
HDL: 44.4 mg/dL (ref >39.00)
LDL Cholesterol: 147 mg/dL — ABNORMAL HIGH (ref 0–99)
NonHDL: 165.89
Total CHOL/HDL Ratio: 5
Triglycerides: 93 mg/dL (ref 0.0–149.0)
VLDL: 18.6 mg/dL (ref 0.0–40.0)

## 2023-04-06 LAB — VITAMIN B12: Vitamin B-12: 527 pg/mL (ref 211–911)

## 2023-04-06 LAB — VITAMIN D 25 HYDROXY (VIT D DEFICIENCY, FRACTURES): VITD: 27.98 ng/mL — ABNORMAL LOW (ref 30.00–100.00)

## 2023-04-06 LAB — TSH: TSH: 2.36 u[IU]/mL (ref 0.35–5.50)

## 2023-04-06 NOTE — Progress Notes (Signed)
Established Patient Office Visit     CC/Reason for Visit: Annual preventive exam  HPI: Kim Armstrong is a 47 y.o. female who is coming in today for the above mentioned reasons. Past Medical History is significant for: Menorrhagia status post ablation in 2023, migraine headaches, prior breast cancer.  She is doing well and has no acute concerns or complaints.  She is overdue for eye and dental care and will schedule.  She is overdue for her initial screening colonoscopy.  She had a mammogram in October 2023, she follows with GYN for cervical cancer screening.   Past Medical/Surgical History: 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   oncologist--- dr Pamelia Hoit;   dx 10/ 2015;   Stage IIA,  chemo 09-19-2014  to 11/ 2016 ;   01-29-2015 left breast lumpectomy w/ sln bx;   IMRT completed 04-27-2015   Menorrhagia    Migraine without aura and without status migrainosus, not intractable    neurologist--  ladonna cook NP    Past Surgical History:  Procedure Laterality Date   BREAST LUMPECTOMY WITH AXILLARY LYMPH NODE BIOPSY Left 01/29/2015   DILATATION & CURETTAGE/HYSTEROSCOPY WITH MYOSURE N/A 09/28/2022   Procedure: DILATATION & CURETTAGE/HYSTEROSCOPY WITH MYOSURE;  Surgeon: Gerald Leitz, MD;  Location: [facility];  Service: Gynecology;  Laterality: N/A;   DILITATION & CURRETTAGE/HYSTROSCOPY WITH HYDROTHERMAL ABLATION N/A 09/28/2022   Procedure: DILATATION & CURETTAGE/HYSTEROSCOPY WITH HYDROTHERMAL ABLATION;  Surgeon: Gerald Leitz, MD;  Location: [facility];  Service: Gynecology;  Laterality: N/A;   ECTOPIC  PREGNANCY SURGERY  05/2009   @[facility] ;    laparoscopic turned laparotomy w/ right salpingectomy    Social History:  reports that she quit smoking about 28 years ago. Her smoking use included cigarettes. She has a 2.00 pack-year smoking history. She has never used smokeless tobacco. She reports current alcohol use. She reports that she does not use drugs.  Allergies: No Known Allergies  Family History:  Family History  Problem Relation Age of Onset   Breast cancer Mother 48       unilateral   Colon cancer Father 99   Cancer Maternal Aunt 79       unknown type of cancer   Cancer Maternal Grandmother 30       cancer - possibly pancreatic cancer or GI cancer   Colon polyps Neg Hx    Esophageal cancer Neg Hx    Stomach cancer Neg Hx      Current Outpatient Medications:    acetaminophen (TYLENOL) 500 MG tablet, Take 1,000 mg by mouth every 6 (six) hours as needed for mild pain or moderate pain., Disp: , Rfl:    calcium carbonate (TUMS - DOSED IN MG ELEMENTAL CALCIUM) 500 MG chewable tablet, Chew 1 tablet by mouth as needed for indigestion or heartburn., Disp: , Rfl:    cyclobenzaprine (FLEXERIL) 5 MG tablet, Take 1 tablet (5 mg total) by mouth at bedtime as needed for muscle spasms., Disp: 30 tablet, Rfl: 1   ibuprofen (ADVIL) 800 MG tablet, Take 1 tablet (800 mg total) by mouth every 8 (eight) hours  as needed., Disp: 30 tablet, Rfl: 0   meloxicam (MOBIC) 7.5 MG tablet, Take 1 tablet (7.5 mg total) by mouth daily., Disp: 30 tablet, Rfl: 0   nortriptyline (PAMELOR) 10 MG capsule, Take 10 mg by mouth at bedtime., Disp: , Rfl:    ondansetron (ZOFRAN ODT) 4 MG disintegrating tablet, Take 1 tablet (4 mg total) by mouth every 8 (eight) hours as needed for nausea or vomiting., Disp: 20 tablet, Rfl: 0   Ubrogepant (UBRELVY) 100 MG TABS, Take by mouth as needed., Disp: , Rfl:   Review of Systems:  Negative unless indicated in HPI.   Physical Exam: Vitals:   04/06/23 0837  BP: 120/80  Pulse:  76  Temp: 98.3 F (36.8 C)  TempSrc: Oral  SpO2: 98%  Weight: 179 lb (81.2 kg)  Height: 5\' 8"  (1.727 m)    Body mass index is 27.22 kg/m.   Physical Exam Vitals reviewed.  Constitutional:      General: She is not in acute distress.    Appearance: Normal appearance. She is not ill-appearing, toxic-appearing or diaphoretic.  HENT:     Head: Normocephalic.     Right Ear: Tympanic membrane, ear canal and external ear normal. There is no impacted cerumen.     Left Ear: Tympanic membrane, ear canal and external ear normal. There is no impacted cerumen.     Nose: Nose normal.     Mouth/Throat:     Mouth: Mucous membranes are moist.     Pharynx: Oropharynx is clear. No oropharyngeal exudate or posterior oropharyngeal erythema.  Eyes:     General: No scleral icterus.       Right eye: No discharge.        Left eye: No discharge.     Conjunctiva/sclera: Conjunctivae normal.     Pupils: Pupils are equal, round, and reactive to light.  Neck:     Vascular: No carotid bruit.  Cardiovascular:     Rate and Rhythm: Normal rate and regular rhythm.     Pulses: Normal pulses.     Heart sounds: Normal heart sounds.  Pulmonary:     Effort: Pulmonary effort is normal. No respiratory distress.     Breath sounds: Normal breath sounds.  Abdominal:     General: Abdomen is flat. Bowel sounds are normal.     Palpations: Abdomen is soft.  Musculoskeletal:        General: Normal range of motion.     Cervical back: Normal range of motion.  Skin:    General: Skin is warm and dry.  Neurological:     General: No focal deficit present.     Mental Status: She is alert and oriented to person, place, and time. Mental status is at baseline.  Psychiatric:        Mood and Affect: Mood normal.        Behavior: Behavior normal.        Thought Content: Thought content normal.        Judgment: Judgment normal.     Flowsheet Row Office Visit from 04/06/2023 in [facility]   PHQ-9 Total Score 1        Impression and Plan:  Encounter for preventive health examination  Screening for malignant neoplasm of colon -     Ambulatory referral to Gastroenterology  Migraine without status migrainosus, not intractable, unspecified migraine type -     CBC with Differential/Platelet; Future -     Comprehensive metabolic panel; Future -  Lipid panel; Future -     TSH; Future -     Vitamin B12; Future -     VITAMIN D 25 Hydroxy (Vit-D Deficiency, Fractures); Future  Encounter for hepatitis C screening test for low risk patient -     Hepatitis C antibody; Future  Encounter for screening for HIV -     HIV Antibody (routine testing w rflx); Future     -Recommend routine eye and dental care. -Healthy lifestyle discussed in detail. -Labs to be updated today. -Prostate cancer screening: N/A Health Maintenance  Topic Date Due   HIV Screening  Never done   Hepatitis C Screening  Never done   COVID-19 Vaccine (4 - 2023-24 season) 07/08/2022   Colon Cancer Screening  10/07/2023*   Flu Shot  06/08/2023   Pap Smear  04/05/2024   DTaP/Tdap/Td vaccine (2 - Td or Tdap) 07/31/2030   HPV Vaccine  Aged Out  *Topic was postponed. The date shown is not the original due date.         Chaya Jan, MD [facility]

## 2023-04-07 LAB — HEPATITIS C ANTIBODY: Hepatitis C Ab: NONREACTIVE

## 2023-04-07 LAB — HIV ANTIBODY (ROUTINE TESTING W REFLEX): HIV 1&2 Ab, 4th Generation: NONREACTIVE

## 2023-08-10 LAB — HM MAMMOGRAPHY

## 2023-08-23 ENCOUNTER — Ambulatory Visit: Payer: 59 | Admitting: Plastic Surgery

## 2023-08-23 ENCOUNTER — Encounter: Payer: Self-pay | Admitting: Plastic Surgery

## 2023-08-23 VITALS — BP 128/86 | HR 86 | Ht 68.0 in | Wt 180.0 lb

## 2023-08-23 DIAGNOSIS — Z853 Personal history of malignant neoplasm of breast: Secondary | ICD-10-CM

## 2023-08-23 DIAGNOSIS — M546 Pain in thoracic spine: Secondary | ICD-10-CM

## 2023-08-23 DIAGNOSIS — M542 Cervicalgia: Secondary | ICD-10-CM

## 2023-08-23 DIAGNOSIS — N62 Hypertrophy of breast: Secondary | ICD-10-CM | POA: Diagnosis not present

## 2023-08-23 DIAGNOSIS — Z923 Personal history of irradiation: Secondary | ICD-10-CM

## 2023-08-23 NOTE — Progress Notes (Signed)
Referring Provider Philip Aspen, Limmie Patricia, MD [address] [place],  [place] [address]   CC:  Chief Complaint  Patient presents with   Consult      Kim Armstrong is an 47 y.o. female.  HPI: Kim Armstrong is a 47 year old female who presents today for discussion of possible bilateral breast reduction.  Patient states that she has upper back and neck pain for many years which she attributes to the large size of her breast.  She also has difficulty finding bras that fit appropriately.  She states that the bras that she is able to find cause pressure on her upper back and neck and she believes this also contributes to her migraine headaches.  Of note the patient was diagnosed with breast cancer underwent breast conserving therapy followed by radiation therapy and 2016.  She understands that radiation therapy may impact the ability to have a breast reduction and is here primarily for a discussion of what can and cannot be done.  Her last mammogram in October 2024 was read as BI-RADS 2 at [facility] mammography  No Known Allergies  Outpatient Encounter Medications as of 08/23/2023  Medication Sig   acetaminophen (TYLENOL) 500 MG tablet Take 1,000 mg by mouth every 6 (six) hours as needed for mild pain or moderate pain.   calcium carbonate (TUMS - DOSED IN MG ELEMENTAL CALCIUM) 500 MG chewable tablet Chew 1 tablet by mouth as needed for indigestion or heartburn.   cyclobenzaprine (FLEXERIL) 5 MG tablet Take 1 tablet (5 mg total) by mouth at bedtime as needed for muscle spasms.   ibuprofen (ADVIL) 800 MG tablet Take 1 tablet (800 mg total) by mouth every 8 (eight) hours as needed.   meloxicam (MOBIC) 7.5 MG tablet Take 1 tablet (7.5 mg total) by mouth daily.   ondansetron (ZOFRAN ODT) 4 MG disintegrating tablet Take 1 tablet (4 mg total) by mouth every 8 (eight) hours as needed for nausea or vomiting.   Ubrogepant (UBRELVY) 100 MG TABS Take by mouth as needed.   nortriptyline  (PAMELOR) 10 MG capsule Take 10 mg by mouth at bedtime.   No facility-administered encounter medications on file as of 08/23/2023.     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   oncologist--- dr Pamelia Hoit;   dx 10/ 2015;   Stage IIA,  chemo 09-19-2014  to 11/ 2016 ;   01-29-2015 left breast lumpectomy w/ sln bx;   IMRT completed 04-27-2015   Menorrhagia    Migraine without aura and without status migrainosus, not intractable    neurologist--  ladonna cook NP    Past Surgical History:  Procedure Laterality Date   BREAST LUMPECTOMY WITH AXILLARY LYMPH NODE BIOPSY Left 01/29/2015   DILATATION & CURETTAGE/HYSTEROSCOPY WITH MYOSURE N/A 09/28/2022   Procedure: DILATATION & CURETTAGE/HYSTEROSCOPY WITH MYOSURE;  Surgeon: Gerald Leitz, MD;  Location: [facility];  Service: Gynecology;  Laterality: N/A;   DILITATION & CURRETTAGE/HYSTROSCOPY WITH HYDROTHERMAL ABLATION N/A 09/28/2022   Procedure: DILATATION & CURETTAGE/HYSTEROSCOPY WITH HYDROTHERMAL ABLATION;  Surgeon: Gerald Leitz, MD;  Location: [facility];  Service: Gynecology;  Laterality:  N/A;   ECTOPIC PREGNANCY SURGERY  05/2009   @[facility] ;    laparoscopic turned laparotomy w/ right salpingectomy    Family History  Problem Relation Age of Onset   Breast cancer Mother 43       unilateral   Colon cancer Father 4   Cancer Maternal Aunt 65       unknown type of cancer   Cancer Maternal Grandmother 39       cancer - possibly pancreatic cancer or GI cancer   Colon polyps Neg Hx    Esophageal cancer Neg Hx    Stomach cancer Neg Hx     Social History   Social History Narrative    Not on file     Review of Systems General: Denies fevers, chills, weight loss CV: Denies chest pain, shortness of breath, palpitations Breast: Patient reports that the large size of her breast make it difficult to find bras that fit appropriately and contribute to her upper back pain.  She also notes that there is a difference in the size of the breast with the right breast slightly larger than the left.  Physical Exam    08/23/2023    8:50 AM 04/06/2023    8:37 AM 01/03/2023   10:00 AM  Vitals with BMI  Height 5\' 8"  5\' 8"    Weight 180 lbs 179 lbs 188 lbs 3 oz  BMI 27.38 27.22   Systolic 128 120 161  Diastolic 86 80 80  Pulse 86 76 95    General:  No acute distress,  Alert and oriented, Non-Toxic, Normal speech and affect Breast: Patient has relatively large breasts with the right breast larger than the left.  She has grade 2 ptosis on the right.  On the left she has a well-healed incision at from the 12:00 to the 2 o'clock position of the areola she has radiation marks from her previous radiation however the skin is soft without stigmata of previous radiation therapy.  Sternal notch to nipple distance on the right is 32 cm and 30 cm on the left.  Nipple to fold distance on the right is 18 cm and 17 cm on the left Mammogram: As noted 08/2022 BI-RADS 2 Assessment/Plan Macromastia: Patient has large pendulous breasts and I believe she would benefit from a bilateral breast reduction.  I believe that I can remove 600 g per breast.  We had a long discussion this morning about breast reductions and how they are performed the extent of the dissection and the incisions that are required.  There was extra discussion because of the patient's radiation therapy and the possibility that due to her previous radiation that she may have significant wound healing complications on the left.  We discussed that I am not able to predict whether this will occur are not although she has relatively normal-appearing  skin and tissue post radiation therapy.  We also discussed the usual risks of bleeding, infection, and seroma formation.  We discussed the use of drains postoperatively.  We discussed the risk of nipple loss due to nipple ischemia.  She understands that after a breast reduction it may be more difficult to interpret mammograms leading to a higher rate of breast biopsy.  We discussed the postoperative limitations of no heavy lifting, no vigorous activity, no submerging incisions in water for 6 weeks.  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  She understands that she will need  to wear a supportive garment for 6 weeks postoperatively.  All questions were answered to her satisfaction.  Photographs were obtained today with her consent.  I have asked her to consider our discussion prior to scheduling surgery.  If she is comfortable with the risks I am comfortable with performing the procedure and we will schedule her for a standard bilateral breast reduction.  Santiago Glad 08/23/2023, 1:03 PM

## 2023-11-22 ENCOUNTER — Other Ambulatory Visit: Payer: Self-pay | Admitting: Medical Genetics

## 2024-02-13 ENCOUNTER — Encounter: Payer: Self-pay | Admitting: Internal Medicine

## 2024-02-13 DIAGNOSIS — E785 Hyperlipidemia, unspecified: Secondary | ICD-10-CM

## 2024-03-29 ENCOUNTER — Other Ambulatory Visit (INDEPENDENT_AMBULATORY_CARE_PROVIDER_SITE_OTHER)

## 2024-03-29 DIAGNOSIS — E785 Hyperlipidemia, unspecified: Secondary | ICD-10-CM | POA: Diagnosis not present

## 2024-03-29 LAB — LIPID PANEL
Cholesterol: 250 mg/dL — ABNORMAL HIGH (ref 0–200)
HDL: 43.6 mg/dL (ref >39.00)
LDL Cholesterol: 177 mg/dL — ABNORMAL HIGH (ref 0–99)
NonHDL: 206.42
Total CHOL/HDL Ratio: 6
Triglycerides: 146 mg/dL (ref 0.0–149.0)
VLDL: 29.2 mg/dL (ref 0.0–40.0)

## 2024-04-02 ENCOUNTER — Other Ambulatory Visit: Payer: Self-pay | Admitting: Internal Medicine

## 2024-04-02 ENCOUNTER — Ambulatory Visit: Payer: Self-pay | Admitting: Internal Medicine

## 2024-04-02 DIAGNOSIS — E785 Hyperlipidemia, unspecified: Secondary | ICD-10-CM

## 2024-04-02 MED ORDER — ROSUVASTATIN CALCIUM 5 MG PO TABS: 5.0000 mg | ORAL_TABLET | Freq: Every day | ORAL | 1 refills | Status: DC

## 2024-08-15 LAB — HM MAMMOGRAPHY

## 2024-08-28 ENCOUNTER — Other Ambulatory Visit: Payer: Self-pay | Admitting: Medical Genetics

## 2024-08-28 DIAGNOSIS — Z006 Encounter for examination for normal comparison and control in clinical research program: Secondary | ICD-10-CM

## 2024-09-02 ENCOUNTER — Telehealth: Payer: Self-pay | Admitting: *Deleted

## 2024-09-02 NOTE — Telephone Encounter (Signed)
 Copied from CRM [id number]. Topic: Clinical - Medical Advice >> Sep 02, 2024  9:32 AM Laymon HERO wrote: Reason for CRM: [facility] calling to get status on biopsy paperwork faxed. She has an appointment tomorrow and they need to have paper signed and sent back asap , please contact as soon as possible [phone] Reena

## 2024-09-02 NOTE — Telephone Encounter (Signed)
 Form was completed, faxed, and confirmed 08/28/2024.  Form was re faxed and confirmed 09/02/2004.

## 2024-09-03 ENCOUNTER — Other Ambulatory Visit: Payer: Self-pay | Admitting: Radiology

## 2024-09-05 ENCOUNTER — Telehealth: Payer: Self-pay | Admitting: Internal Medicine

## 2024-09-05 ENCOUNTER — Telehealth: Payer: Self-pay | Admitting: Hematology and Oncology

## 2024-09-05 LAB — SURGICAL PATHOLOGY

## 2024-09-05 NOTE — Telephone Encounter (Signed)
 left vm for pt about scheduled appt date and time. Encouraged to call back if need to reschedule

## 2024-09-05 NOTE — Telephone Encounter (Signed)
 Order faxed and confirmed x 2.

## 2024-09-05 NOTE — Telephone Encounter (Signed)
 Copied from CRM [id number]. Topic: Referral - Status >> Sep 05, 2024  9:38 AM China J wrote: Reason for CRM: A lady calling from [facility] mammography wanted to let Dr. Theophilus know that a referral for a biopsy has been sent 6 times since the beginning of October for signing off but nothing has been sent back to them. The patient completed her biopsy on the 28th and is coming back for more procedures to be done which will need signing off on as well.  She is wanting this biopsy referral faxed back as soon as possible. Please call [phone] for further information/questions.

## 2024-09-12 ENCOUNTER — Other Ambulatory Visit: Payer: Self-pay

## 2024-09-12 LAB — HM MAMMOGRAPHY

## 2024-09-12 NOTE — Progress Notes (Signed)
 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  She underwent primary TCHP followed by lumpectomy and a sentinel node biopsy at [facility].SABRA  She had a complete pathologic response.  She then underwent radiotherapy completed her anti-HER2 therapy and did tamoxifen  for 7 years.  She has had no issues since.  She works here in [place].  She has a family history of breast cancer in her mother at a young age.  She has negative genetics at the last cancer.  She underwent a mammogram that shows her to have C density breast tissue.  There are some grouped calcifications in the right breast measuring 7 mm.  These are located in the anterior depth of the lower inner quadrant.  She underwent a biopsy of this.  This shows her to have ductal carcinoma in situ with 2's foci of microinvasion.  There are no prognostic panel done.  I requested a prognostic panel.  She is here today to discuss her options.  She has seen Dr. Waddell of plastic surgery last year as she was interested in undergoing a reduction and lift.  She elected not to proceed with that as it was more complicated on the left side.    Review of Systems: A complete review of systems was obtained from the patient.  I have reviewed this information and discussed as appropriate with the patient.  See HPI as well for other ROS.  Review of Systems  All other systems reviewed and are negative.    Medical History: Past Medical History:  Diagnosis Date  . GERD (gastroesophageal reflux disease)   . History of cancer   . Hyperlipidemia     There is no problem list on file for this patient.   Past Surgical History:  Procedure Laterality Date  . .endometrial ablation    .  MASTECTOMY PARTIAL / LUMPECTOMY       No Known Allergies  Current Outpatient Medications on File Prior to Visit  Medication Sig Dispense Refill  . rizatriptan (MAXALT) 5 MG tablet Take 5 mg by mouth as directed for Migraine May take a second dose after 2 hours if needed.    . UBRELVY 100 mg Tab Take 100 mg by mouth at bedtime as needed    . EMGALITY PEN 120 mg/mL PnIj INJECT 1 ML INTO THE SKIN EVERY 30 DAYS.     No current facility-administered medications on file prior to visit.    Family History  Problem Relation Age of Onset  . Hyperlipidemia (Elevated cholesterol) Mother   . Breast cancer Mother   . Colon cancer Father      Social History   Tobacco Use  Smoking Status Never  Smokeless Tobacco Never     Social History   Socioeconomic History  . Marital status: Married  Tobacco Use  . Smoking status: Never  . Smokeless tobacco: Never  Vaping Use  . Vaping status: Unknown  Substance and Sexual Activity  . Alcohol use: Yes    Alcohol/week: 0.0 - 1.0 standard drinks of alcohol  . Drug use: Never   Social Drivers of Corporate Investment Banker Strain: Low Risk  (01/18/2023)   Received from [facility]   Overall Financial Resource Strain (CARDIA)   .  Difficulty of Paying Living Expenses: Not hard at all  Food Insecurity: No Food Insecurity (01/18/2023)   Received from [facility]   Hunger Vital Sign   . Within the past 12 months, you worried that your food would run out before you got the money to buy more.: Never true   . Within the past 12 months, the food you bought just didn't last and you didn't have money to get more.: Never true  Transportation Needs: No Transportation Needs (01/18/2023)   Received from [facility] - Transportation   . Lack of Transportation (Medical): No   . Lack of Transportation (Non-Medical): No  Physical Activity: Sufficiently Active (01/18/2023)   Received from [facility]   Exercise Vital Sign   . On average, how  many days per week do you engage in moderate to strenuous exercise (like a brisk walk)?: 3 days   . On average, how many minutes do you engage in exercise at this level?: 50 min  Stress: No Stress Concern Present (01/18/2023)   Received from [facility] of Occupational Health - Occupational Stress Questionnaire   . Feeling of Stress : Only a little  Social Connections: Socially Integrated (01/18/2023)   Received from [facility]   Social Network   . How would you rate your social network (family, work, friends)?: Good participation with social networks  Housing Stability: Unknown (09/12/2024)   Housing Stability Vital Sign   . Homeless in the Last Year: No    Objective:   Vitals:   09/12/24 1014 09/12/24 1015  BP: 117/87   Pulse: 97   Resp: 16   Temp: 36.7 C (98 F)   SpO2: 97%   Weight: 82.2 kg (181 lb 3.2 oz)   Height: 170.2 cm (5' 7)   PainSc:  0-No pain    Body mass index is 28.38 kg/m.  Physical Exam Vitals reviewed.  Constitutional:      Appearance: Normal appearance.  Chest:  Breasts:    Right: No inverted nipple, mass or nipple discharge.     Left: No inverted nipple, mass or nipple discharge.    Lymphadenopathy:     Upper Body:     Right upper body: No supraclavicular or axillary adenopathy.     Left upper body: No supraclavicular or axillary adenopathy.  Neurological:     Mental Status: She is alert.        Assessment and Plan:     Diagnoses and all orders for this visit:  Ductal carcinoma in situ (DCIS) of right breast    Likely right breast Magseed guided lumpectomy, injection of mag trace for delayed sentinel lymph node identification  We discussed the staging and pathophysiology of breast cancer. We discussed all of the different options for treatment for breast cancer including surgery, chemotherapy, radiation therapy, herceptin ,  and antiestrogen therapy.  We discussed a sentinel lymph node biopsy. Certainly with  invasion it is indicated.  I am however going to wait until final pathology. Will inject with magtrace at time of surgery to do delayed node biopsy pending final pathologic confirmation of invasive disease.  She is agreeable to this approach.  We discussed the options for treatment of the breast cancer which included lumpectomy versus a mastectomy. We discussed the performance of the lumpectomy with radioactive seed placement. We discussed a 5-10% chance of a positive margin requiring reexcision in the operating room. We also discussed that she will likely need radiation therapy if  she undergoes lumpectomy.  We discussed mastectomy and the postoperative care for that as well. Mastectomy can be followed by reconstruction. The decision for lumpectomy vs mastectomy has no impact on decision for chemotherapy. Most mastectomy patients will not need radiation therapy. We discussed that there is no difference in her survival whether she undergoes lumpectomy with radiation therapy or antiestrogen therapy versus a mastectomy. There is also no real difference between her recurrence in the breast.  We discussed the risks of operation including bleeding, infection, possible reoperation.  She understands her further therapy will be based on what her stages at the time of  her operation.  We discussed could be done in combination with Dr Waddell for reducing left side after lumpectomy for symmetry.  She is going to consider these options and let me know     MATTHEW CARLIN BURY, MD

## 2024-09-13 ENCOUNTER — Encounter: Payer: Self-pay | Admitting: Internal Medicine

## 2024-09-13 LAB — SURGICAL PATHOLOGY

## 2024-09-16 ENCOUNTER — Inpatient Hospital Stay: Attending: Hematology and Oncology | Admitting: Hematology and Oncology

## 2024-09-16 ENCOUNTER — Encounter: Payer: Self-pay | Admitting: *Deleted

## 2024-09-16 ENCOUNTER — Inpatient Hospital Stay

## 2024-09-16 VITALS — BP 124/86 | HR 67 | Temp 97.3°F | Resp 18 | Ht 67.0 in | Wt 180.4 lb

## 2024-09-16 DIAGNOSIS — D0511 Intraductal carcinoma in situ of right breast: Secondary | ICD-10-CM | POA: Insufficient documentation

## 2024-09-16 DIAGNOSIS — C50412 Malignant neoplasm of upper-outer quadrant of left female breast: Secondary | ICD-10-CM

## 2024-09-16 DIAGNOSIS — Z170 Estrogen receptor positive status [ER+]: Secondary | ICD-10-CM

## 2024-09-16 LAB — CBC WITH DIFFERENTIAL (CANCER CENTER ONLY)
Abs Immature Granulocytes: 0.01 K/uL (ref 0.00–0.07)
Basophils Absolute: 0.1 K/uL (ref 0.0–0.1)
Basophils Relative: 1 %
Eosinophils Absolute: 0.1 K/uL (ref 0.0–0.5)
Eosinophils Relative: 3 %
HCT: 39.4 % (ref 36.0–46.0)
Hemoglobin: 13.9 g/dL (ref 12.0–15.0)
Immature Granulocytes: 0 %
Lymphocytes Relative: 29 %
Lymphs Abs: 1.4 K/uL (ref 0.7–4.0)
MCH: 30.5 pg (ref 26.0–34.0)
MCHC: 35.3 g/dL (ref 30.0–36.0)
MCV: 86.6 fL (ref 80.0–100.0)
Monocytes Absolute: 0.5 K/uL (ref 0.1–1.0)
Monocytes Relative: 10 %
Neutro Abs: 2.7 K/uL (ref 1.7–7.7)
Neutrophils Relative %: 57 %
Platelet Count: 262 K/uL (ref 150–400)
RBC: 4.55 MIL/uL (ref 3.87–5.11)
RDW: 12.3 % (ref 11.5–15.5)
WBC Count: 4.7 K/uL (ref 4.0–10.5)
nRBC: 0 % (ref 0.0–0.2)

## 2024-09-16 LAB — CMP (CANCER CENTER ONLY)
ALT: 14 U/L (ref 0–44)
AST: 17 U/L (ref 15–41)
Albumin: 4.7 g/dL (ref 3.5–5.0)
Alkaline Phosphatase: 54 U/L (ref 38–126)
Anion gap: 6 (ref 5–15)
BUN: 11 mg/dL (ref 6–20)
CO2: 26 mmol/L (ref 22–32)
Calcium: 10 mg/dL (ref 8.9–10.3)
Chloride: 104 mmol/L (ref 98–111)
Creatinine: 0.87 mg/dL (ref 0.44–1.00)
GFR, Estimated: >60 mL/min (ref >60)
Glucose, Bld: 96 mg/dL (ref 70–99)
Potassium: 4 mmol/L (ref 3.5–5.1)
Sodium: 136 mmol/L (ref 135–145)
Total Bilirubin: 0.4 mg/dL (ref 0.0–1.2)
Total Protein: 7.6 g/dL (ref 6.5–8.1)

## 2024-09-16 NOTE — Progress Notes (Signed)
 Patient Care Team: Theophilus Andrews, Tully GRADE, MD as PCP - General (Internal Medicine) Timmie Norris, MD (Obstetrics and Gynecology) Vanderbilt Ned, MD as Consulting Physician (General Surgery) Odean Potts, MD as Consulting Physician (Hematology and Oncology) Keenan Hastings, MD as Consulting Physician (Radiation Oncology) Moses Powell Hummer, NP as Nurse Practitioner (Hematology and Oncology) Gallagher, Kristalyn Kay, DO as Referring Physician (Surgical Oncology)  DIAGNOSIS:  Encounter Diagnosis  Name Primary?   Malignant neoplasm of upper-outer quadrant of left breast in female, estrogen receptor positive (HCC) Yes    SUMMARY OF ONCOLOGIC HISTORY: Oncology History  Breast cancer of upper-outer quadrant of left female breast (HCC)  08/20/2014 Initial Biopsy   Left breast bx: Invasive ductal carcinoma grade 3, ER+ (100%), PR+ (89%) HER-2 positive ratio 3.5, Ki-67 25%; biopsy of satellite lesion fibroadenoma   08/26/2014 Breast MRI   Left breast: Upper-outer quadrant 1.4 cm from nipple 1.9 x 2.5 x 2.6 cm enhancing mass with the 8 mm nodule 1 cm posterior Right breast: 1.4 x 1.6 cm mass upper outer quadrant biopsy proven fibroadenoma   08/26/2014 Clinical Stage   Stage IIA: T2 N0   09/19/2014 -  Neo-Adjuvant Chemotherapy   Neoadjuvant chemotherapy with Taxotere , carboplatin , Herceptin  and Perjeta  x6 cycles followed by Herceptin  maintenance to be complete November 2016   09/26/2014 Procedure   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 Otherwise negative at ATM, BARD1, BRCA1, BRCA2, BRIP1, CDH1, CHEK2, EPCAM, MLH1, MRE11A, MSH2, MSH6, MUTYH, NBN, NF1, PALB2, PMS2, PTEN, RAD50, RAD51C, RAD51D, SMARCA4, STK11, and TP53.   01/09/2015 Breast MRI   Interval marked response to chemotherapy with significant decrease in the size of the tumor, satellite nodule is now vaguely visible, 2.6 cm mass is now 0.9 cm   01/29/2015 Definitive Surgery   Left breast  lumpectomy/SLNB by [facility]; 3.2 cm area of microscopic IDC cells, 2 sentinel lymph nodes negative (0/2). 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   Left breast boost/ 16 Gy at 2 Gy per fraction x 8 fractions   06/05/2015 -  Anti-estrogen oral therapy   Tamoxifen  20 mg daily. Planned duration of treatment 5-10 years.   11/03/2015 Survivorship   Survivorship care visit completed and copy of care plan provided to patient.   09/12/2024 Relapse/Recurrence   Screening mammogram detected right breast calcifications:biopsy: DCIS with 2 foci of microinvasion less than 1 mm, intermediate grade, ER 100%, PR 10% Retroareolar left breast: 1 cm mass, biopsy: Grade 3 IDC, no LVI, ER 70%, PR 15%, Ki67 10%, HER2 2+ by IHC, FISH Pending     CHIEF COMPLIANT: Recurrence of breast cancer  HISTORY OF PRESENT ILLNESS: Kim Armstrong is 48 year old with prior history of left breast cancer who presented with a screening mammogram that detected calcification of the right breast which on biopsy came back as DCIS with 2 foci of microinvasion that was ER/PR positive.  The left breast on further assessment was found to have a 1 cm nodule which on biopsy suggested grade 3 IDC ER/PR positive HER2 2+ by IHC.  She was presented recently to the multidisciplinary tumor board and she is here today to discuss her treatment plan.     ALLERGIES:  has no known allergies.  MEDICATIONS:  Current Outpatient Medications  Medication Sig Dispense Refill  calcium  carbonate (TUMS - DOSED IN MG ELEMENTAL CALCIUM ) 500 MG chewable tablet Chew 1 tablet by mouth as needed for indigestion or heartburn.     ondansetron  (ZOFRAN  ODT) 4 MG disintegrating tablet Take 1 tablet (4 mg total) by mouth every 8 (eight) hours as needed for nausea or vomiting. 20 tablet 0    rizatriptan (MAXALT) 10 MG tablet Take 10 mg by mouth.     Ubrogepant (UBRELVY) 100 MG TABS Take by mouth as needed.     EMGALITY 120 MG/ML SOAJ INJECT 1 ML INTO THE SKIN EVERY 30 DAYS.     No current facility-administered medications for this visit.    PHYSICAL EXAMINATION: ECOG PERFORMANCE STATUS: 1 - Symptomatic but completely ambulatory  Vitals:   09/16/24 1106  BP: 124/86  Pulse: 67  Resp: 18  Temp: (!) 97.3 F (36.3 C)  SpO2: 100%   Filed Weights   09/16/24 1106  Weight: 180 lb 6.4 oz (81.8 kg)     LABORATORY DATA:  I have reviewed the data as listed    Latest Ref Rng & Units 09/16/2024   11:46 AM 04/06/2023    8:55 AM 09/27/2022   10:25 AM  CMP  Glucose 70 - 99 mg/dL 96  898    BUN 6 - 20 mg/dL 11  16    Creatinine 9.55 - 1.00 mg/dL 9.12  9.17    Sodium 864 - 145 mmol/L 136  138    Potassium 3.5 - 5.1 mmol/L 4.0  4.3    Chloride 98 - 111 mmol/L 104  106    CO2 22 - 32 mmol/L 26  24    Calcium  8.9 - 10.3 mg/dL 89.9  9.4    Total Protein 6.5 - 8.1 g/dL 7.6  7.1  7.3   Total Bilirubin 0.0 - 1.2 mg/dL 0.4  0.3  0.5   Alkaline Phos 38 - 126 U/L 54  49  52   AST 15 - 41 U/L 17  13  14    ALT 0 - 44 U/L 14  12  12      Lab Results  Component Value Date   WBC 4.7 09/16/2024   HGB 13.9 09/16/2024   HCT 39.4 09/16/2024   MCV 86.6 09/16/2024   PLT 262 09/16/2024   NEUTROABS 2.7 09/16/2024    ASSESSMENT & PLAN:  Breast cancer of upper-outer quadrant of left female breast (HCC) Left breast invasive ductal carcinoma: ER/PR positive HER-2 positive Ki-67 of 25 percent: T2, N0, M0 clinical stage II A; 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   Pathology: Lumpectomy at [facility] on 01/29/2015: residual microscopic invasive ductal carcinoma spanning 3.2 cm T2 N0 M0 Pathological stage II a   Treatment summary: Neoadjuvant chemotherapy with Taxotere , carboplatin , Herceptin  and Perjeta  given once every 3 weeks from 09/19/2014 to 01/02/2015 .Adjuvant  radiation therapy completed June 2016; Herceptin  every 3 weeks completed 12/17/2015 started tamoxifen  06/05/2015. 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 If ER/PR positive we will consider oophorectomy/ovarian function  suppression with aromatase inhibitor therapy If her2 positive, then we will have to consider adjuvant chemotherapy like Kadcyla. Recommended CT CAP and bone scan for staging    Patient will be discussed with Dr. Ebbie regarding her best surgical option.  She is undecided at this point. I will see her back after surgery.   Orders Placed This Encounter  Procedures   CBC with Differential (Cancer Center Only)    Standing Status:   Future    Number of Occurrences:   1    Expiration Date:   09/16/2025   CMP (Cancer Center only)    Standing Status:   Future    Number of Occurrences:   1    Expiration Date:   09/16/2025   The patient has a good understanding of the overall plan. she agrees with it. she will call with any problems that may develop before the next visit here.  I personally spent a total of 45 minutes in the care of the patient today including preparing to see the patient, getting/reviewing separately obtained history, performing a medically appropriate exam/evaluation, counseling and educating, placing orders, referring and communicating with other health care professionals, documenting clinical information in the EHR, independently interpreting results, communicating results,  and coordinating care.   Viinay K Kaylianna Detert, MD 09/16/24

## 2024-09-16 NOTE — Progress Notes (Unsigned)
 Nurse navigator attended initial med onc appt with patient and Dr. Gudena. Informed patient of role of navigation and she has my contact information. Patient stated she is interested in talking to social work about counseling. Social worker made aware and referral placed. Genetics referral placed. Will follow up with patent tomorrow regarding her HER2 results via FISH per MD request. Patient verbalizes understanding of above.

## 2024-09-16 NOTE — Assessment & Plan Note (Addendum)
 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   Pathology: Lumpectomy at [facility] on 01/29/2015: residual microscopic invasive ductal carcinoma spanning 3.2 cm T2 N0 M0 Pathological stage II a   Treatment summary: Neoadjuvant chemotherapy with Taxotere , carboplatin , Herceptin  and Perjeta  given once every 3 weeks from 09/19/2014 to 01/02/2015 .Adjuvant radiation therapy completed June 2016; Herceptin  every 3 weeks completed 12/17/2015 started tamoxifen  06/05/2015. 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 If ER/PR positive we will consider oophorectomy/ovarian function suppression with aromatase inhibitor therapy If her2 positive, then we will have to consider adjuvant chemotherapy like Kadcyla. Recommended CT CAP and bone scan for staging

## 2024-09-17 ENCOUNTER — Encounter: Payer: Self-pay | Admitting: Licensed Clinical Social Worker

## 2024-09-17 NOTE — Progress Notes (Signed)
 [facility] Clinical Social Work  Clinical Social Work was referred by statistician for emotional support.  Clinical Social Worker contacted patient by phone to offer support and assess for needs.    Patient recently diagnosed with L breast cancer recurrence and a new R breast cancer. She is interested in counseling to help process as she makes decisions.    Interventions: Informed pt of counseling availability as well as peer mentor program and support group      Follow Up Plan:  CSW will see patient on 09/23/2024 for counseling    Taylour Lietzke E Symantha Steeber, LCSW  Clinical Social Worker [facility]

## 2024-09-18 ENCOUNTER — Encounter: Payer: Self-pay | Admitting: *Deleted

## 2024-09-18 NOTE — Progress Notes (Signed)
 Results back from Her2 FISH and it is positive. Reviewed this result with Dr. Odean and Dr. Ebbie. I was asked to call patient with this information and to schedule a f/u appt a few days after scans ordered for Dr.Gudena to review treatment plan with her. Patient called and made aware of results. Was discussed in office visit with Dr. Odean two days ago that if was positive she likely would need chemo. Patient will have a port put in during her surgery. Dr. Ebbie to see patient again next week. Her scans are ordered for Monday. Patient relayed to this navigator at recent appt that she is interested in counseling. Social work has already set up first counseling appt with patient on Monday. Anzal is aware of navigators number for any questions or concerns.

## 2024-09-19 ENCOUNTER — Telehealth: Payer: Self-pay | Admitting: Hematology and Oncology

## 2024-09-19 NOTE — Telephone Encounter (Signed)
 left vm for pt about scheduled appt date and time. Encouraged to callback with and questions or concerns

## 2024-09-20 ENCOUNTER — Ambulatory Visit: Admitting: Plastic Surgery

## 2024-09-20 VITALS — BP 129/92 | HR 76 | Ht 67.0 in | Wt 178.0 lb

## 2024-09-20 DIAGNOSIS — C50412 Malignant neoplasm of upper-outer quadrant of left female breast: Secondary | ICD-10-CM | POA: Diagnosis not present

## 2024-09-20 DIAGNOSIS — Z170 Estrogen receptor positive status [ER+]: Secondary | ICD-10-CM | POA: Diagnosis not present

## 2024-09-20 NOTE — Progress Notes (Signed)
 Referring Provider Kim Armstrong, Kim GRADE, Armstrong [address] [place],  [place] [address]   CC:  Chief Complaint  Patient presents with   Advice Only      Kim Armstrong is an 48 y.o. female.  HPI: Kim Armstrong is a 48 year old female who is referred from Kim Armstrong for discussion of breast reconstruction after mastectomy.  Patient was diagnosed with a left breast cancer in 2015.  She underwent lumpectomy chemotherapy and radiation.  On screening mammograms this year she was found to have an abnormality in the right breast which was biopsied and found to be DCIS with microinvasion.  She also was found to have a 1 cm nodule it was positive for invasive ductal carcinoma on the left.  She is currently undergoing evaluation and discussing treatment options but she is leaning towards bilateral mastectomies.  She is interested in what her reconstructive options are.  No Known Allergies  Outpatient Encounter Medications as of 09/20/2024  Medication Sig   calcium  carbonate (TUMS - DOSED IN MG ELEMENTAL CALCIUM ) 500 MG chewable tablet Chew 1 tablet by mouth as needed for indigestion or heartburn.   EMGALITY 120 MG/ML SOAJ INJECT 1 ML INTO THE SKIN EVERY 30 DAYS.   ondansetron  (ZOFRAN  ODT) 4 MG disintegrating tablet Take 1 tablet (4 mg total) by mouth every 8 (eight) hours as needed for nausea or vomiting.   rizatriptan (MAXALT) 10 MG tablet Take 10 mg by mouth.   Ubrogepant (UBRELVY) 100 MG TABS Take by mouth as needed.   No facility-administered encounter medications on file as of 09/20/2024.     Past Medical History:  Diagnosis Date   Endometrial mass    GERD (gastroesophageal reflux disease)    History of chemotherapy    09-19-2014  to 11/ 2016  left breast cancer   History of external beam radiation therapy    03-10-2015  to 04-27-2015  left breast cancer   Malignant neoplasm of upper-outer quadrant of left breast in female, estrogen receptor positive (HCC)  08/2014   oncologist--- dr Kim Armstrong;   dx 10/ 2015;   Stage IIA,  chemo 09-19-2014  to 11/ 2016 ;   01-29-2015 left breast lumpectomy w/ sln bx;   IMRT completed 04-27-2015   Menorrhagia    Migraine without aura and without status migrainosus, not intractable    neurologist--  Kim cook NP    Past Surgical History:  Procedure Laterality Date   BREAST LUMPECTOMY WITH AXILLARY LYMPH NODE BIOPSY Left 01/29/2015   DILATATION & CURETTAGE/HYSTEROSCOPY WITH MYOSURE N/A 09/28/2022   Procedure: DILATATION & CURETTAGE/HYSTEROSCOPY WITH MYOSURE;  Surgeon: Kim Armstrong;  Location: [facility];  Service: Gynecology;  Laterality: N/A;   DILITATION & CURRETTAGE/HYSTROSCOPY WITH HYDROTHERMAL ABLATION N/A 09/28/2022   Procedure: DILATATION & CURETTAGE/HYSTEROSCOPY WITH HYDROTHERMAL ABLATION;  Surgeon: Kim Armstrong;  Location: [facility];  Service: Gynecology;  Laterality: N/A;   ECTOPIC PREGNANCY SURGERY  05/2009   @Kim Armstrong ;    laparoscopic turned laparotomy w/ right salpingectomy    Family History  Problem Relation Age of Onset   Breast cancer Mother 67       unilateral   Colon cancer Father 21   Cancer Maternal Aunt 42       unknown type of cancer   Cancer Maternal Grandmother 61       cancer - possibly pancreatic cancer or GI cancer   Colon polyps Neg Hx    Esophageal cancer Neg Hx  Stomach cancer Neg Hx     Social History   Social History Narrative   Not on file     Review of Systems General: Denies fevers, chills, weight loss CV: Denies chest pain, shortness of breath, palpitations Breast: History of left breast cancer, radiation therapy, newly diagnosed right DCIS with microinvasion.  Physical Exam    09/20/2024    2:22 PM 09/16/2024   11:06 AM 08/23/2023    8:50 AM  Vitals with BMI  Height 5' 7 5' 7 5' 8  Weight 178 lbs 180 lbs 6 oz 180 lbs  BMI 27.87 28.25 27.38  Systolic 129 124 871  Diastolic 92 86 86  Pulse 76 67 86    General:   No acute distress,  Alert and oriented, Non-Toxic, Normal speech and affect Breast: As noted above patient has a well-healed incision and radiation tattooing on the left breast.  The right breast is large with Armstrong 2 ptosis.  The base width of the breast is 13 cm and the fold to nipple distance is 18 cm. Mammogram: Abnormality on last breast exam resulting in biopsies and diagnosis of DCIS on the right and invasive breast cancer on the left Assessment/Plan Breast cancer: Patient is currently undergoing evaluation and considering treatment options but is leaning towards bilateral mastectomies.  I spent 45 minutes discussing the reconstructive options including autologous reconstruction using the tissue below the umbilicus on the anterior abdominal wall and implant-based reconstruction.  We discussed the fact that autologous tissue is her own tissue and rarely has a significant concern of infection.  It is not done at the time of mastectomy though we would place tissue expanders in anticipation of flap reconstruction.  We discussed the longer operative time and a prolonged hospitalization.  We then discussed implant-based reconstruction.  I showed her a tissue expander and an implant.  We discussed the timing of the tissue expander placement and the subsequent expansion and tissue expander to implant exchange.  We discussed placement above and below the pectoralis muscle and how I would determine this position.  She understands that I will use an acellular dermal matrix regardless of which position the tissue expanders placed.  We discussed the risks of infection.  We discussed the fact that if she underwent implant reconstruction she still could change to tissue-based reconstruction in the future if she desired.  We discussed the concerns of BIA-ALCL.  She was given the [employer] website as a resource.  I will go ahead and submit her for placement of tissue expanders.  All questions  were answered to her satisfaction.  Photographs were obtained when I saw her last year for a possible breast reduction.  Kim Armstrong 09/20/2024, 3:42 PM

## 2024-09-23 ENCOUNTER — Encounter: Payer: Self-pay | Admitting: Internal Medicine

## 2024-09-23 ENCOUNTER — Ambulatory Visit (HOSPITAL_COMMUNITY)
Admission: RE | Admit: 2024-09-23 | Discharge: 2024-09-23 | Disposition: A | Discharge status: Home / Self Care | Source: Ambulatory Visit | Attending: Hematology and Oncology | Admitting: Hematology and Oncology

## 2024-09-23 ENCOUNTER — Encounter (HOSPITAL_COMMUNITY)
Admission: RE | Admit: 2024-09-23 | Discharge: 2024-09-23 | Disposition: A | Discharge status: Home / Self Care | Source: Ambulatory Visit | Attending: Hematology and Oncology | Admitting: Hematology and Oncology

## 2024-09-23 ENCOUNTER — Inpatient Hospital Stay: Admitting: Licensed Clinical Social Worker

## 2024-09-23 DIAGNOSIS — C50412 Malignant neoplasm of upper-outer quadrant of left female breast: Secondary | ICD-10-CM | POA: Diagnosis present

## 2024-09-23 DIAGNOSIS — Z170 Estrogen receptor positive status [ER+]: Secondary | ICD-10-CM | POA: Insufficient documentation

## 2024-09-23 MED ORDER — IOHEXOL 300 MG/ML  SOLN
100.0000 mL | Freq: Once | INTRAMUSCULAR | Status: AC | PRN
  Administered 2024-09-23: 100 mL via INTRAVENOUS

## 2024-09-23 MED ORDER — TECHNETIUM TC 99M MEDRONATE IV KIT
18.8000 | PACK | Freq: Once | INTRAVENOUS | Status: AC
  Administered 2024-09-23: 18.8 via INTRAVENOUS

## 2024-09-23 NOTE — Progress Notes (Signed)
 [facility] CSW Counseling Note  Patient was referred by nurse navigator. Treatment type: Individual  Presenting Concerns: Patient and/or family reports the following symptoms/concerns: anxiety and stress Duration of problem: 1 months; Severity of problem: severe   Orientation:oriented to person, place, time/date, and situation.   Affect: Appropriate and Congruent Risk of harm to self or others: No plan to harm self or others  Patient and/or Family's Strengths/Protective Factors: Social and Emotional competence and Concrete supports in place (healthy food, safe environments, etc.)Ability for insight  Capable of independent living  Communication skills  General fund of knowledge  Motivation for treatment/growth  Special hobby/interest  Supportive family/friends      Goals Addressed: Patient will:  Reduce symptoms of: anxiety and stress Increase knowledge and/or ability of: coping skills  Increase healthy adjustment to current life circumstances   Progress towards Goals: Initial   Interventions: Interventions utilized:  CBT Cognitive Behavioral Therapy, Psychoeducation and/or Health Education, and Supportive Reflection  GAD 7 PHQ 9     09/23/2024    9:49 AM 04/06/2023    8:49 AM 01/03/2023   10:16 AM  PHQ9 SCORE ONLY  PHQ-9 Total Score 10 1  1       Data saved with a previous flowsheet row definition       09/23/2024    9:48 AM 04/06/2023    8:49 AM 01/03/2023   10:17 AM  GAD 7 : Generalized Anxiety Score  Nervous, Anxious, on Edge 3 1 0  Control/stop worrying 3 0 0  Worry too much - different things 2 0 1  Trouble relaxing 2 0 0  Restless 0 0 1  Easily annoyed or irritable 3 0 0  Afraid - awful might happen 3 0 0  Total GAD 7 Score 16 1 2   Anxiety Difficulty Very difficult Somewhat difficult Not difficult at all       Assessment: Patient currently experiencing high levels of stress impacting focus, especially at work, since re-diagnosis of breast cancer. Pt  initially diagnosed 10 years ago and feels she handled that well with great support from friends and family. She was more mentally prepared as her mom also had breast cancer young. Now, it is more difficult since she is facing bilateral mastectomies and needing to make decisions about going flat vs reconstruction & which type.  Pt is also debating on whether to share or not at work.  CSW provided supportive counsel and discussion on what people think about when deciding on closure post-surgery.     Socially: Pt is married. She has a 22yo son from her 1st marriage (husband is deceased) and a photographer who lives with them 50% of the time. 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 Activities: Pt enjoys cross stitch, reading, political & civic engagement    Plan: Follow up with CSW: 2 weeks Behavioral recommendations: continue to think about your options for post-mastectomy (flat vs different reconstruction). Think about what is better long-term vs short-term.  Continue using your strategies for managing attention concerns with work.  Add in short active breaks between meetings. Restart box breathing/meditation exercises. Referral(s):        Karsyn Jamie E Randy Whitener, LCSW

## 2024-09-24 ENCOUNTER — Other Ambulatory Visit: Payer: Self-pay | Admitting: *Deleted

## 2024-09-24 DIAGNOSIS — C50412 Malignant neoplasm of upper-outer quadrant of left female breast: Secondary | ICD-10-CM

## 2024-09-24 LAB — GENECONNECT MOLECULAR SCREEN: Genetic Analysis Overall Interpretation: NEGATIVE

## 2024-09-24 NOTE — Therapy (Signed)
 OUTPATIENT PHYSICAL THERAPY BREAST CANCER BASELINE EVALUATION   Patient Name: Kim Armstrong MRN: [id number] DOB:02-03-1976, 48 y.o., female Today's Date: 09/25/2024  END OF SESSION:  PT End of Session - 09/25/24 0837     Visit Number 1    Number of Visits 2    Date for Recertification  12/26/24    PT Start Time 0803    PT Stop Time 0835    PT Time Calculation (min) 32 min    Activity Tolerance Patient tolerated treatment well    Behavior During Therapy [facility] for tasks assessed/performed          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   oncologist--- dr odean;   dx 10/ 2015;   Stage IIA,  chemo 09-19-2014  to 11/ 2016 ;   01-29-2015 left breast lumpectomy w/ sln bx;   IMRT completed 04-27-2015   Menorrhagia    Migraine without aura and without status migrainosus, not intractable    neurologist--  jhonny ahle NP   Past Surgical History:  Procedure Laterality Date   BREAST LUMPECTOMY WITH AXILLARY LYMPH NODE BIOPSY Left 01/29/2015   DILATATION & CURETTAGE/HYSTEROSCOPY WITH MYOSURE N/A 09/28/2022   Procedure: DILATATION & CURETTAGE/HYSTEROSCOPY WITH MYOSURE;  Surgeon: Rosalva Sawyer, MD;  Location: [facility];  Service: Gynecology;  Laterality: N/A;   DILITATION & CURRETTAGE/HYSTROSCOPY WITH HYDROTHERMAL ABLATION N/A 09/28/2022   Procedure: DILATATION & CURETTAGE/HYSTEROSCOPY WITH HYDROTHERMAL ABLATION;  Surgeon: Rosalva Sawyer, MD;  Location: [facility];  Service: Gynecology;  Laterality: N/A;   ECTOPIC PREGNANCY SURGERY  05/2009   @[facility] ;    laparoscopic turned laparotomy w/ right salpingectomy    Patient Active Problem List   Diagnosis Date Noted   Vitamin D  deficiency 04/06/2023   Hyperlipidemia 04/06/2023   Migraines 07/31/2020   Genetic testing - OvaNext gene panel 10/09/2014   Breast cancer of upper-outer quadrant of left female breast (HCC) 08/22/2014    PCP: Tully Theophilus Andrews, MD  REFERRING PROVIDER: Donnice Bury, MD  REFERRING DIAG: [phone] (ICD-10-CM) - Malignant neoplasm of upper-outer quadrant of left breast in female, estrogen receptor positive (HCC)  THERAPY DIAG:  Abnormal posture  Malignant neoplasm of upper-outer quadrant of left breast in female, estrogen receptor positive (HCC)  Rationale for Evaluation and Treatment: Rehabilitation  ONSET DATE: 09/12/24  SUBJECTIVE:  SUBJECTIVE STATEMENT: Patient reports she is here today to be seen by her medical team for her newly diagnosed bilateral breast cancer.   PERTINENT HISTORY:  Patient was diagnosed on 09/12/24 with left grade 3 IDC It measures 1 cm and is located in the upper outer quadrant. It is triple positive with a Ki67 of 10%. 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  Hx of triple positive IDC 10 years ago. Underwent lumpectomy and SLNB 01/29/15 at [facility] Completed chemo, radiation and anti-HER2 therapy as well as tamoxifen .   PATIENT GOALS:   reduce lymphedema risk and learn post op HEP.   PAIN:  Are you having pain? No  PRECAUTIONS: Active CA None  RED FLAGS: None   HAND DOMINANCE: right  WEIGHT BEARING RESTRICTIONS: No  FALLS:  Has patient fallen in last 6 months? 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  COGNITION: Overall cognitive status: Within functional limits for tasks assessed    POSTURE:  Forward head and rounded shoulders posture  UPPER EXTREMITY AROM/PROM:  A/PROM RIGHT   eval   Shoulder extension 84  Shoulder flexion 167  Shoulder abduction 179  Shoulder internal rotation 73  Shoulder external rotation 83    (Blank rows = not tested)  A/PROM LEFT   eval  Shoulder extension 71  Shoulder flexion 161  Shoulder abduction 178  Shoulder internal rotation 66  Shoulder external rotation 86    (Blank rows = not tested)  CERVICAL AROM: All within normal limits:    Percent limited  Flexion WFL  Extension WFL  Right lateral flexion WFL  Left lateral flexion WFL  Right rotation WFL  Left rotation WFL    UPPER EXTREMITY STRENGTH: 5/5  LYMPHEDEMA ASSESSMENTS (in cm):   LANDMARK RIGHT   eval  10 cm proximal to olecranon process from proximal aspect of olecranon 26.9  Olecranon process 24.3  10 cm proximal to ulnar styloid process from proximal aspect of styloid process 20.9  Just distal to ulnar styloid process 16.1  Across hand at thumb web space 19  At base of 2nd digit 6  (Blank rows = not tested)  LANDMARK LEFT   eval  10 cm proximal to olecranon process from proximal aspect of olecranon 26.7   Olecranon process 24  10 cm proximal to ulnar styloid process from proximal aspect of styloid process 20  Just distal to ulnar styloid process 15.5  Across hand at thumb web space 19.1  At base of 2nd digit 6  (Blank rows = not tested)  L-DEX LYMPHEDEMA SCREENING:  The patient was assessed using the L-Dex machine today to produce a lymphedema index  baseline score. The patient will be reassessed on a regular basis (typically every 3 months) to obtain new L-Dex scores. If the score is > 6.5 points away from his/her baseline score indicating onset of subclinical lymphedema, it will be recommended to wear a compression garment for 4 weeks, 12 hours per day and then be reassessed. If the score continues to be > 6.5 points from  baseline at reassessment, we will initiate lymphedema treatment. Assessing in this manner has a 95% rate of preventing clinically significant lymphedema.    QUICK DASH SURVEY:  Junie Palin - 09/25/24 0001     Open a tight or new jar Mild difficulty    Do heavy household chores (wash walls, wash floors) No difficulty    Carry a shopping bag or briefcase No difficulty    Wash your back No difficulty    Use a knife to cut food No difficulty    Recreational activities in which you take some force or impact through your arm, shoulder, or hand (golf, hammering, tennis) No difficulty    During the past week, to what extent has your arm, shoulder or hand problem interfered with your normal social activities with family, friends, neighbors, or groups? Not at all    During the past week, to what extent has your arm, shoulder or hand problem limited your work or other regular daily activities Not at all    Arm, shoulder, or hand pain. None    Tingling (pins and needles) in your arm, shoulder, or hand None    Difficulty Sleeping No difficulty    DASH Score 2.27 %           PATIENT EDUCATION:  Education details: Time spent educating patient on aspects of self-care to maximize post op recovery. Patient was educated on where and how to get a post op compression bra to use to reduce post op edema. Patient was also educated on the use of SOZO screenings and surveillance principles for early identification of lymphedema onset. She was instructed to use the post op pillow in the axilla for pressure and pain relief. Patient educated on  lymphedema risk reduction and post op shoulder/posture HEP. Person educated: Patient Education method: Explanation, Demonstration, Handout Education comprehension: Patient verbalized understanding and returned demonstration  HOME EXERCISE PROGRAM: Patient was instructed today in a home exercise program today for post op shoulder range of motion. These included active assist shoulder flexion in sitting, scapular retraction, wall walking with shoulder abduction, and hands behind head external rotation.  She was encouraged to do these twice a day, holding 3 seconds and repeating 5 times when permitted by her physician.   ASSESSMENT:  CLINICAL IMPRESSION: Pt reports to PT with recently diagnosed L and R breast cancer. Her L side is a recurrence from 2016. In 2016 she underwent L breast lumpectomy and SLNB followed by radiation and completed tamoxifen  for 7 years. She plans to have bilateral mastectomies with a L SLNB and possibly nodes from R depending. Baseline ROM measurements were taken today. Will reassess post op.  She will benefit from a post op PT reassessment to determine needs and from L-Dex screens every 3 months for 2 years to detect subclinical lymphedema.  Pt will benefit from skilled therapeutic intervention to improve on the following deficits: Decreased knowledge of precautions, impaired UE functional use, pain, decreased ROM, postural dysfunction.   PT treatment/interventions: ADL/self-care home management, pt/family education, therapeutic exercise  REHAB POTENTIAL: Good  CLINICAL DECISION MAKING: Evolving/moderate complexity  EVALUATION COMPLEXITY: Moderate   GOALS: Goals reviewed with patient? YES  LONG TERM GOALS: (STG=LTG)    Name Target Date Goal status  1 Pt will be able to verbalize understanding of pertinent lymphedema risk reduction practices relevant to her dx specifically related to skin care.  Baseline:  No knowledge 09/25/2024 Achieved at eval  2 Pt will be  able to return demo and/or  verbalize understanding of the post op HEP related to regaining shoulder ROM. Baseline:  No knowledge 09/25/2024 Achieved at eval  3 Pt will be able to verbalize understanding of the importance of viewing the post op After Breast CA Class video for further lymphedema risk reduction education and therapeutic exercise.  Baseline:  No knowledge 09/25/2024 Achieved at eval  4 Pt will demo she has regained full shoulder ROM and function post operatively compared to baselines.  Baseline: See objective measurements taken today. 12/26/24 NEW    PLAN:  PT FREQUENCY/DURATION: EVAL and 1 follow up appointment.   PLAN FOR NEXT SESSION: will reassess 3-4 weeks post op to determine needs.   Patient will follow up at outpatient cancer rehab 3-4 weeks following surgery.  If the patient requires physical therapy at that time, a specific plan will be dictated and sent to the referring physician for approval. The patient was educated today on appropriate basic range of motion exercises to begin post operatively and the importance of viewing the After Breast Cancer class video following surgery.  Patient was educated today on lymphedema risk reduction practices as it pertains to recommendations that will benefit the patient immediately following surgery.  She verbalized good understanding.    Physical Therapy Information for After Breast Cancer Surgery/Treatment:  Lymphedema is a swelling condition that you may be at risk for in your arm if you have lymph nodes removed from the armpit area.  After a sentinel node biopsy, the risk is approximately 5-9% and is higher after an axillary node dissection.  There is treatment available for this condition and it is not life-threatening.  Contact your physician or physical therapist with concerns. You may begin the 4 shoulder/posture exercises (see additional sheet) when permitted by your physician (typically a week after surgery).  If you have  drains, you may need to wait until those are removed before beginning range of motion exercises.  A general recommendation is to not lift your arms above shoulder height until drains are removed.  These exercises should be done to your tolerance and gently.  This is not a no pain/no gain type of recovery so listen to your body and stretch into the range of motion that you can tolerate, stopping if you have pain.  If you are having immediate reconstruction, ask your plastic surgeon about doing exercises as he or she may want you to wait. We encourage you to view the After Breast Cancer class video following surgery.  You will learn information related to lymphedema risk, prevention and treatment and additional exercises to regain mobility following surgery.   While undergoing any medical procedure or treatment, try to avoid blood pressure being taken or needle sticks from occurring on the arm on the side of cancer.   This recommendation begins after surgery and continues for the rest of your life.  This may help reduce your risk of getting lymphedema (swelling in your arm). An excellent resource for those seeking information on lymphedema is the National Lymphedema Network's web site. It can be accessed at www.lymphnet.org If you notice swelling in your hand, arm or breast at any time following surgery (even if it is many years from now), please contact your doctor or physical therapist to discuss this.  Lymphedema can be treated at any time but it is easier for you if it is treated early on.  If you feel like your shoulder motion is not returning to normal in a reasonable amount of time, please contact your  surgeon or physical therapist.  [facility] [phone]. [address], [place] [address]  [facility] CLASS After Breast Cancer Class  After Breast Cancer Class is a specially designed exercise class video to assist you in a safe recover after having breast  cancer surgery.  In this video you will learn how to get back to full function whether your drains were just removed or if you had surgery a month ago. The video can be viewed on this page: [email or web address].boyd-meyer.org/ or on YouTube here: https://[email or web address].[email or web address].  Class Goals  Understand specific stretches to improve the flexibility of you chest and shoulder. Learn ways to safely strengthen your upper body and improve your posture. Understand the warning signs of infection and why you may be at risk for an arm infection. Learn about Lymphedema and prevention.  ** You do not need to view this video until after surgery.  Drains should be removed to participate in the recommended exercises on the video.  Patient was instructed today in a home exercise program today for post op shoulder range of motion. These included active assist shoulder flexion in sitting, scapular retraction, wall walking with shoulder abduction, and hands behind head external rotation.  She was encouraged to do these twice a day, holding 3 seconds and repeating 5 times when permitted by her physician.    Florina Sever [place], PT 09/25/2024, 8:38 AM

## 2024-09-25 ENCOUNTER — Ambulatory Visit: Attending: General Surgery | Admitting: Physical Therapy

## 2024-09-25 ENCOUNTER — Encounter: Payer: Self-pay | Admitting: *Deleted

## 2024-09-25 ENCOUNTER — Encounter: Payer: Self-pay | Admitting: Physical Therapy

## 2024-09-25 ENCOUNTER — Other Ambulatory Visit: Payer: Self-pay

## 2024-09-25 DIAGNOSIS — Z170 Estrogen receptor positive status [ER+]: Secondary | ICD-10-CM | POA: Diagnosis present

## 2024-09-25 DIAGNOSIS — C50412 Malignant neoplasm of upper-outer quadrant of left female breast: Secondary | ICD-10-CM | POA: Insufficient documentation

## 2024-09-25 DIAGNOSIS — R293 Abnormal posture: Secondary | ICD-10-CM | POA: Diagnosis present

## 2024-09-26 ENCOUNTER — Inpatient Hospital Stay: Admitting: Hematology and Oncology

## 2024-09-26 ENCOUNTER — Ambulatory Visit

## 2024-09-26 VITALS — BP 117/79 | HR 77 | Ht 67.0 in | Wt 177.8 lb

## 2024-09-26 VITALS — BP 130/82 | HR 72 | Temp 98.7°F | Ht 67.0 in | Wt 178.4 lb

## 2024-09-26 DIAGNOSIS — D0511 Intraductal carcinoma in situ of right breast: Secondary | ICD-10-CM | POA: Diagnosis not present

## 2024-09-26 DIAGNOSIS — N62 Hypertrophy of breast: Secondary | ICD-10-CM

## 2024-09-26 DIAGNOSIS — C50412 Malignant neoplasm of upper-outer quadrant of left female breast: Secondary | ICD-10-CM | POA: Diagnosis not present

## 2024-09-26 DIAGNOSIS — Z170 Estrogen receptor positive status [ER+]: Secondary | ICD-10-CM

## 2024-09-26 DIAGNOSIS — N2889 Other specified disorders of kidney and ureter: Secondary | ICD-10-CM

## 2024-09-26 NOTE — Progress Notes (Signed)
 Patient Care Team: Theophilus Andrews, Tully GRADE, MD as PCP - General (Internal Medicine) Timmie Norris, MD (Obstetrics and Gynecology) Vanderbilt Ned, MD as Consulting Physician (General Surgery) Odean Potts, MD as Consulting Physician (Hematology and Oncology) Keenan Hastings, MD as Consulting Physician (Radiation Oncology) Moses Powell Hummer, NP as Nurse Practitioner (Hematology and Oncology) Gallagher, Kristalyn Kay, DO as Referring Physician (Surgical Oncology) Gerome Devere HERO, RN as Oncology Nurse Navigator  DIAGNOSIS:  Encounter Diagnosis  Name Primary?   Malignant neoplasm of upper-outer quadrant of left breast in female, estrogen receptor positive (HCC) Yes    SUMMARY OF ONCOLOGIC HISTORY: Oncology History  Breast cancer of upper-outer quadrant of left female breast (HCC)  08/20/2014 Initial Biopsy   Left breast bx: Invasive ductal carcinoma grade 3, ER+ (100%), PR+ (89%) HER-2 positive ratio 3.5, Ki-67 25%; biopsy of satellite lesion fibroadenoma   08/26/2014 Breast MRI   Left breast: Upper-outer quadrant 1.4 cm from nipple 1.9 x 2.5 x 2.6 cm enhancing mass with the 8 mm nodule 1 cm posterior Right breast: 1.4 x 1.6 cm mass upper outer quadrant biopsy proven fibroadenoma   08/26/2014 Clinical Stage   Stage IIA: T2 N0   09/19/2014 -  Neo-Adjuvant Chemotherapy   Neoadjuvant chemotherapy with Taxotere , carboplatin , Herceptin  and Perjeta  x6 cycles followed by Herceptin  maintenance to be complete November 2016   09/26/2014 Procedure   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 Otherwise negative at ATM, BARD1, BRCA1, BRCA2, BRIP1, CDH1, CHEK2, EPCAM, MLH1, MRE11A, MSH2, MSH6, MUTYH, NBN, NF1, PALB2, PMS2, PTEN, RAD50, RAD51C, RAD51D, SMARCA4, STK11, and TP53.   01/09/2015 Breast MRI   Interval marked response to chemotherapy with significant decrease in the size of the tumor, satellite nodule is now vaguely visible, 2.6 cm mass is now 0.9 cm    01/29/2015 Definitive Surgery   Left breast lumpectomy/SLNB by Dr. Iva at [facility]; 3.2 cm area of microscopic IDC cells, 2 sentinel lymph nodes negative (0/2). 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   Left breast boost/ 16 Gy at 2 Gy per fraction x 8 fractions   06/05/2015 -  Anti-estrogen oral therapy   Tamoxifen  20 mg daily. Planned duration of treatment 5-10 years.   11/03/2015 Survivorship   Survivorship care visit completed and copy of care plan provided to patient.   09/12/2024 Relapse/Recurrence   Screening mammogram detected right breast calcifications:biopsy: DCIS with 2 foci of microinvasion less than 1 mm, intermediate grade, ER 100%, PR 10% Retroareolar left breast: 1 cm mass, biopsy: Grade 3 IDC, no LVI, ER 70%, PR 15%, Ki67 10%, HER2 2+ by IHC, FISH Pending     CHIEF COMPLIANT: F/U to discuss treatment plan  HISTORY OF PRESENT ILLNESS:  History of Present Illness Kim Armstrong is a 48 year old female with breast cancer who presents for follow-up regarding recent imaging findings and treatment planning.  She has HER2 positive breast cancer and is awaiting final pathology to confirm HER2 status and guide adjuvant treatment decisions. Recent imaging, including a bone scan and CT scan, showed no concerns related to her breast cancer, but an incidental nodule on her right kidney requires further evaluation by a urologist.  She has been off tamoxifen  for several years and is considering resuming it or exploring other hormonal therapies. She is  contemplating a bilateral mastectomy and has consulted with plastic surgeons regarding reconstruction, planning to schedule the surgery for late December or early January.     ALLERGIES:  has no known allergies.  MEDICATIONS:  Current Outpatient  Medications  Medication Sig Dispense Refill   calcium  carbonate (TUMS - DOSED IN MG ELEMENTAL CALCIUM ) 500 MG chewable tablet Chew 1 tablet by mouth as needed for indigestion or heartburn.     EMGALITY 120 MG/ML SOAJ INJECT 1 ML INTO THE SKIN EVERY 30 DAYS.     ondansetron  (ZOFRAN  ODT) 4 MG disintegrating tablet Take 1 tablet (4 mg total) by mouth every 8 (eight) hours as needed for nausea or vomiting. 20 tablet 0   Ubrogepant (UBRELVY) 100 MG TABS Take by mouth as needed.     rizatriptan (MAXALT) 10 MG tablet Take 10 mg by mouth.     No current facility-administered medications for this visit.    PHYSICAL EXAMINATION: ECOG PERFORMANCE STATUS: 1 - Symptomatic but completely ambulatory  Vitals:   09/26/24 1318  BP: 130/82  Pulse: 72  Temp: 98.7 F (37.1 C)  SpO2: 100%   Filed Weights   09/26/24 1318  Weight: 178 lb 6.4 oz (80.9 kg)      LABORATORY DATA:  I have reviewed the data as listed    Latest Ref Rng & Units 09/16/2024   11:46 AM 04/06/2023    8:55 AM 09/27/2022   10:25 AM  CMP  Glucose 70 - 99 mg/dL 96  898    BUN 6 - 20 mg/dL 11  16    Creatinine 9.55 - 1.00 mg/dL 9.12  9.17    Sodium 864 - 145 mmol/L 136  138    Potassium 3.5 - 5.1 mmol/L 4.0  4.3    Chloride 98 - 111 mmol/L 104  106    CO2 22 - 32 mmol/L 26  24    Calcium  8.9 - 10.3 mg/dL 89.9  9.4    Total Protein 6.5 - 8.1 g/dL 7.6  7.1  7.3   Total Bilirubin 0.0 - 1.2 mg/dL 0.4  0.3  0.5   Alkaline Phos 38 - 126 U/L 54  49  52   AST 15 - 41 U/L 17  13  14    ALT 0 - 44 U/L 14  12  12      Lab Results  Component Value Date   WBC 4.7 09/16/2024   HGB 13.9 09/16/2024   HCT 39.4 09/16/2024   MCV 86.6 09/16/2024   PLT 262 09/16/2024   NEUTROABS 2.7 09/16/2024    ASSESSMENT & PLAN:  Breast cancer of upper-outer quadrant of left female breast (HCC) Left breast invasive ductal carcinoma: ER/PR positive HER-2 positive Ki-67 of 25 percent: T2, N0, M0 clinical stage II A; Biopsy is a satellite lesion  showed fibroadenoma.   Pathology: Lumpectomy at [facility] on 01/29/2015: residual microscopic invasive ductal carcinoma spanning 3.2 cm T2 N0 M0 Pathological stage II a   Treatment summary: Neoadjuvant chemotherapy with Taxotere , carboplatin , Herceptin  and Perjeta  given once every 3 weeks from 09/19/2014 to 01/02/2015 .Adjuvant radiation therapy completed June 2016; Herceptin  every 3 weeks completed 12/17/2015 started tamoxifen  06/05/2015. Tamoxifen  started 06/05/2015 stopped 2023     Recurrence: Mammogram 09/12/2024:  Right breast calcifications biopsy: DCIS with 2 foci of microinvasion less than 1 mm, intermediate grade, ER 100%, PR 10% Retroareolar left breast: 1 cm mass, biopsy: Grade 3 IDC, no LVI, ER 70%, PR 15%, Ki67 10%, HER2 2+ by IHC,  FISH positive  CT CAP 09/23/2024: No evidence of metastatic disease, right lower pole renal mass 3.4 x 3 cm Bone scan 09/23/2024: Benign  Recommendation: Bilateral mastectomies Since the final pathology is ER/PR and HER2 positive, I discussed with her about Taxol Herceptin  versus Kadcyla.  However I would like to repeat HER2 testing and the final pathology to make sure there is no heterogeneity of the HER2 receptor.  We will consider anti-HER2 therapy only if she has a repeat testing positive for the HER2. Urology consultation regarding the kidney lesion.   Return to clinic after surgery to discuss final pathology report and to look at the HER2 testing.     No orders of the defined types were placed in this encounter.  The patient has a good understanding of the overall plan. she agrees with it. she will call with any problems that may develop before the next visit here.  I personally spent a total of 30 minutes in the care of the patient today including preparing to see the patient, getting/reviewing separately obtained history, performing a medically appropriate exam/evaluation, counseling and educating, placing orders, referring and  communicating with other health care professionals, documenting clinical information in the EHR, independently interpreting results, communicating results, and coordinating care.   Viinay K Maddon Horton, MD 09/26/24

## 2024-09-26 NOTE — Progress Notes (Signed)
 Plastic & Reconstructive Surgery New Patient Visit  Patient: Kim Armstrong MRN: [id number] Date: 09/26/2024 Surgical Oncologist: Medical Oncologist:  Reason for Consult: Breast Reconstruction   History of Present Illness:  This is a 47 y.o. woman who presents in consultation for breast reconstruction.   Her breast history is as follows:  Oncology History  Breast cancer of upper-outer quadrant of left female breast (HCC)  08/20/2014 Initial Biopsy   Left breast bx: Invasive ductal carcinoma grade 3, ER+ (100%), PR+ (89%) HER-2 positive ratio 3.5, Ki-67 25%; biopsy of satellite lesion fibroadenoma   08/26/2014 Breast MRI   Left breast: Upper-outer quadrant 1.4 cm from nipple 1.9 x 2.5 x 2.6 cm enhancing mass with the 8 mm nodule 1 cm posterior Right breast: 1.4 x 1.6 cm mass upper outer quadrant biopsy proven fibroadenoma   08/26/2014 Clinical Stage   Stage IIA: T2 N0   09/19/2014 -  Neo-Adjuvant Chemotherapy   Neoadjuvant chemotherapy with Taxotere , carboplatin , Herceptin  and Perjeta  x6 cycles followed by Herceptin  maintenance to be complete November 2016   09/26/2014 Procedure   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 Otherwise negative at ATM, BARD1, BRCA1, BRCA2, BRIP1, CDH1, CHEK2, EPCAM, MLH1, MRE11A, MSH2, MSH6, MUTYH, NBN, NF1, PALB2, PMS2, PTEN, RAD50, RAD51C, RAD51D, SMARCA4, STK11, and TP53.   01/09/2015 Breast MRI   Interval marked response to chemotherapy with significant decrease in the size of the tumor, satellite nodule is now vaguely visible, 2.6 cm mass is now 0.9 cm   01/29/2015 Definitive Surgery   Left breast lumpectomy/SLNB by Dr. Iva at [facility]; 3.2 cm area of microscopic IDC cells, 2 sentinel lymph nodes negative (0/2). 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   Left breast boost/ 16 Gy at 2 Gy per fraction x 8 fractions   06/05/2015 -  Anti-estrogen oral therapy   Tamoxifen  20 mg daily. Planned duration of treatment 5-10 years.   11/03/2015 Survivorship   Survivorship care visit completed and copy of care plan provided to patient.   09/12/2024 Relapse/Recurrence   Screening mammogram detected right breast calcifications:biopsy: DCIS with 2 foci of microinvasion less than 1 mm, intermediate grade, ER 100%, PR 10% Retroareolar left breast: 1 cm mass, biopsy: Grade 3 IDC, no LVI, ER 70%, PR 15%, Ki67 10%, HER2 2+ by IHC, FISH Pending     She recently met with Dr. Ebbie where they discussed lumpectomy with oncoplastic reduction versus bilateral mastectomies. Dr. Waddell saw her for initial reconstruction and she was referred to me to discuss autologous tissue reconstruction in the future. She is hoping for bilateral DIEP flaps.   She would ideally like to be a B-C cup.   Body mass index is 27.85 kg/m.  Past Medical History: 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   oncologist--- dr odean;   dx 10/ 2015;   Stage IIA,  chemo 09-19-2014  to 11/ 2016 ;   01-29-2015  left breast lumpectomy w/ sln bx;   IMRT completed 04-27-2015   Menorrhagia    Migraine without aura and without status migrainosus, not intractable    neurologist--  ladonna cook NP    Past Surgical History: Past Surgical History:  Procedure Laterality Date   BREAST LUMPECTOMY WITH AXILLARY LYMPH NODE BIOPSY Left 01/29/2015   DILATATION & CURETTAGE/HYSTEROSCOPY WITH MYOSURE N/A 09/28/2022   Procedure: DILATATION &  CURETTAGE/HYSTEROSCOPY WITH MYOSURE;  Surgeon: Rosalva Sawyer, MD;  Location[facility] [place];  Service: Gynecology;  Laterality: N/A;   DILITATION & CURRETTAGE/HYSTROSCOPY WITH HYDROTHERMAL ABLATION N/A 09/28/2022   Procedure: DILATATION & CURETTAGE/HYSTEROSCOPY WITH HYDROTHERMAL ABLATION;  Surgeon: Rosalva Sawyer, MD;  Location: [facility];  Service: Gynecology;  Laterality: N/A;   ECTOPIC PREGNANCY SURGERY  05/2009   @[facility] ;    laparoscopic turned laparotomy w/ right salpingectomy    Current Medications: Current Outpatient Medications on File Prior to Visit  Medication Sig Dispense Refill   calcium  carbonate (TUMS - DOSED IN MG ELEMENTAL CALCIUM ) 500 MG chewable tablet Chew 1 tablet by mouth as needed for indigestion or heartburn.     EMGALITY 120 MG/ML SOAJ INJECT 1 ML INTO THE SKIN EVERY 30 DAYS.     ondansetron  (ZOFRAN  ODT) 4 MG disintegrating tablet Take 1 tablet (4 mg total) by mouth every 8 (eight) hours as needed for nausea or vomiting. 20 tablet 0   rizatriptan (MAXALT) 10 MG tablet Take 10 mg by mouth.     Ubrogepant (UBRELVY) 100 MG TABS Take by mouth as needed.     No current facility-administered medications on file prior to visit.    Allergies: No Known Allergies  Family History:  No family history is negative for bleeding/clotting disorders, problems with anesthesia, connective tissue disorders.   Social History:  Social History   Socioeconomic History   Marital status: Married    Spouse name: Not on file   Number of children: 1   Years of education: Not on file   Highest education level: Not on file  Occupational History   Not on file  Tobacco Use   Smoking status: Former    Current packs/day: 0.00    Average packs/day: 1 pack/day for 2.0 years (2.0 ttl pk-yrs)    Types: Cigarettes    Start date: 02/18/1993    Quit date: 02/19/1995    Years since quitting: 29.6   Smokeless tobacco: Never  Vaping Use   Vaping status: Never Used   Substance and Sexual Activity   Alcohol use: Yes    Comment: occasional   Drug use: Never   Sexual activity: Not on file  Other Topics Concern   Not on file  Social History Narrative   Not on file   Social Drivers of Health   Financial Resource Strain: Low Risk  (01/18/2023)   Received from [facility]   Overall Financial Resource Strain (CARDIA)    Difficulty of Paying Living Expenses: Not hard at all  Food Insecurity: No Food Insecurity (01/18/2023)   Received from [facility]   Hunger Vital Sign    Within the past 12 months, you worried that your food would run out before you got the money to buy more.: Never true    Within the past 12 months, the food you bought just didn't last and you didn't have money to get more.: Never true  Transportation Needs: No Transportation Needs (01/18/2023)   Received from [facility] - Transportation  Lack of Transportation (Medical): No    Lack of Transportation (Non-Medical): No  Physical Activity: Sufficiently Active (01/18/2023)   Received from [facility]   Exercise Vital Sign    On average, how many days per week do you engage in moderate to strenuous exercise (like a brisk walk)?: 3 days    On average, how many minutes do you engage in exercise at this level?: 50 min  Stress: No Stress Concern Present (01/18/2023)   Received from [facility] - Occupational Stress Questionnaire    Feeling of Stress : Only a little  Social Connections: Socially Integrated (01/18/2023)   Received from [facility]   Social Network    How would you rate your social network (family, work, friends)?: Good participation with social networks   She is a non-smoker. Denies recreational drug use.   Review of systems: 10 point review of systems performed and negative except as noted in the HPI.  Physical Exam: LMP 09/13/2024 (Approximate)  Body mass index is 27.85 kg/m. MA as  chaperone General: Well appearing, no apparent distress. Pulm: Breathing comfortably on room air without sounds/wheezing. CV: Regular rate. Good perfusion of extremities. Chest: Chest wall without abnormality or obvious deformity or asymmetry.  Breast: Bilateral grade 2 ptosis. Breasts are overall symmetric with regards to shape and contour. Bilateral NAC viable and sensate. Papules everted without discharge. NACs positioned along breast meridian bilaterally. Skin quality is good. There are no skin lesions, striae, or dimpling. There are prior incisions.  Abdomen: Soft, nondistended, nontender to palpation. No palpable umbilical hernia.  4 cm of diastasis appreciated. There are prior incisions from tubal ruputure in the lower abdomen. There is sufficient skin and subcutaneous tissue for breast reconstruction to about B-C cup.  Neuro: Moving all four extremities spontaneously.  Psych: Appropriate mood and affect.   Labs: Recent Results (from the past 2160 hours)  HM MAMMOGRAPHY     Status: None   Collection Time: 08/15/24  7:42 AM  Result Value Ref Range   HM Mammogram 0-4 Bi-Rad 0-4 Bi-Rad, Self Reported Normal    Comment: Abstracted by HIM  Surgical pathology     Status: None   Collection Time: 09/03/24 12:00 AM  Result Value Ref Range   SURGICAL PATHOLOGY      SURGICAL PATHOLOGY [facility] [address], [address] [place], [place] [address] Telephone [phone] or [phone] Fax [phone]  REPORT OF SURGICAL PATHOLOGY   Accession #: [id number] Patient Name: MAIKAYLA, BEGGS Visit # :   MRN: [id number] Physician: Vonzell Knee DOB/Age [birth year] (Age: 1) Gender: F Collected Date: 09/03/2024 Received Date: 09/03/2024  FINAL DIAGNOSIS       1. 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      - NECROSIS: PRESENT.      - CALCIFICATIONS: PRESENT.      - DCIS LENGTH: 0.7 CM.      - SEE NOTE.       Diagnosis Note : Immunohistochemical stains are performed.  P63 and SMM      demonstrate focal loss of myoepithelial cell layer surrounding atypical glands,      consistent with microinvasion.  A preliminary result was communicated to [facility] at [facility] on 09/04/24.      Biomarkers will be deferred to the excisional specimen.      This case underwent intradepartmental consultation and Dr. Janel concurs with      the interpretation.      DATE SIGNED OUT: 09/05/2024 ELECTRONIC SIGNATURE : Coronel Md, Misti, Sports Administrator, International Aid/development Worker  MICROSCOPIC DESCRIPTION  CASE COMMENTS STAINS USED IN DIAGNOSIS: H&E-2 H&E-3 H&E-4 H&E H&E-2 H&E-3 H&E-4 H&E Universal Negative Control-DAB P63 Smooth Muscle Myosin - 1 Heavy Chain P63 Smooth Muscle Myosin - 1 Heavy Chain *RECUT 1 SLIDE Stains used in diagnosis 1 ER-ACIS, 1 PR-ACIS Estrogen receptor (6F11), immunohistochemical stains are performed on formalin fixed, paraffin embedded tissue using a 3,3-diaminobenzidine (DAB) chromogen and Leica Bond Autostainer System.  The staining intensity of the nucleus is scored manually and is reported as the percentage of tumor cell nuclei demonstrating specific nuclear staining .Specimens are fixed in 10% Neutral Buffered Formalin for at least 6 hours and up to 72 hours.  These tests have not be validated on decalcified tissue.  Results should be interpreted with caution given the possibility of false negative results on decalcified specimens. PR progesterone receptor (16), immunohistochemical stains are performed on formalin fixed, paraffin embedded tissue using a 3,3-diaminobenzidine (DAB) chromogen and Leica Bond Autostainer System.  The staining intensity of the nucleus is scored manually and is reported as the percentage of tumor  cell nuclei demonstrating specific nuclear staining.Specimens are fixed in 10% Neutral Buffered Formalin for at least 6 hours and up to 72 hours. These tests have not be validated on decalcified tissue.  Results should be interpreted with caution given the possibility of false negative results on decalcified specimens.  ADDENDUM Breast, right, needle core biopsy, 5-6 o'clock, 7cmfn PROGNOSTIC INDICATORS  Resul ts: IMMUNOHISTOCHEMICAL AND MORPHOMETRIC ANALYSIS PERFORMED MANUALLY Estrogen Receptor:  100%, POSITIVE, STRONG STAINING INTENSITY Progesterone Receptor:  10%, POSITIVE, WEAK-MODERATE STAINING INTENSITY REFERENCE RANGE ESTROGEN RECEPTOR NEGATIVE     0% POSITIVE       =>1% REFERENCE RANGE PROGESTERONE RECEPTOR NEGATIVE     0% POSITIVE        =>1% All controls stained appropriately Picklesimer Md, Fred , Sports Administrator, International Aid/development Worker ( Signed 11 06 2025)   CLINICAL HISTORY  SPECIMEN(S) OBTAINED 1. Breast, right, needle core biopsy, 5 - 6 O'clock, 7cmfn  SPECIMEN COMMENTS: 1. Time in formalin: 9:64 pm; 48 year old female with 0.7cm group of calcifications right breast SPECIMEN CLINICAL INFORMATION: 1. ADH, FAtoid change    Gross Description 1. Received in formalin labeled O'Connell,Cieanna and right (TIF 1314, CIT 5 mins) are multiple cores and irregular pieces of pink white to yellow soft tissue, 1.8 x 1.3 x 0.4 cm in aggregate.  Tissues with calcific ations are submitted in block A with remaining specimen in block B.  (SW:gt, 09/04/24)        Report signed out from the following location(s) [place] [address] [place], [place] [address] CLIA #: [id number]  [facility] [address] [place], [place] [address] CLIA #: [id number]   Surgical pathology     Status: None   Collection Time: 09/12/24 12:00 AM  Result Value Ref Range   SURGICAL PATHOLOGY      SURGICAL PATHOLOGY [facility] [address] [place], [place] [address] Telephone [phone] or [phone] Fax [phone]  REPORT OF SURGICAL PATHOLOGY   Accession #: [id number] Patient Name: STEPHENI, CAMERON Visit # :  MRN: [id number] Physician: Jama Cones DOB/Age [birth year] (Age: 56) Gender: F Collected Date: 09/12/2024 Received Date: 09/12/2024  FINAL DIAGNOSIS       1. Breast, left, needle core biopsy, retroareolar :       - INVASIVE DUCTAL CARCINOMA, SEE NOTE      - TUBULE FORMATION: SCORE 3 OF 3      - NUCLEAR PLEOMORPHISM: SCORE 3 OF 3      - MITOTIC COUNT: SCORE 2 OF 3      - TOTAL SCORE: 8 OF 9      - OVERALL GRADE: III/III      - LYMPHOVASCULAR INVASION: NOT IDENTIFIED      - CANCER LENGTH: 9 MM IN GREATEST LINEAR DIMENSION      - CALCIFICATIONS: NOT IDENTIFIED      - OTHER FINDINGS: N/A      NOTE:      DR. [facility] REVIEWED THE CASE AND CONCURS WITH THE INTERPRETATION.  A BREAST      PROGNOSTIC PROFILE  (ER, PR, KI-67 AND HER2) IS PENDING AND WILL BE REPORTED IN      AN ADDENDUM.  [facility] WAS NOTIFIED ON 09/13/2024.       DATE SIGNED OUT: 09/13/2024 ELECTRONIC SIGNATURE : Legolvan Do, Mark, Pathologist, Electronic Signature  MICROSCOPIC DESCRIPTION  CASE COMMENTS STAINS USED IN DIAGNOSIS: H&E-2 H&E-3 H&E-4 H&E *RECUT 1 SLIDE Stains used in diagnosis 1 Her2 by IHC, 1 ER-ACIS, 1 KI-67-ACIS, 1 PR-ACIS, 1 Her2 FISH IHC scores are reported using ASCO/CAP scoring criteria.  An IHC Score of 0 or 1+  is NEGATIVE for HER2, 3+ is POSITIVE for HER2, and 2+ is EQUIVOCAL. Equivocal results are reflexed to either FISH or IHC testing. Specimens are fixed in 10% Neutral Buffered Formalin for at least 6 hours and up to 72 hours. These tests have not be validated on decalcified tissue.  Results should be interpreted with caution given the possibility of false negative results on decalcified specimens. Antibody Clone for HER2 is 4B5 (PATHWAY). Some of  these immu nohistochemical stains may have been developed and the performance characteristics determined by [facility].  Some may not have been cleared or approved by the U.S. Food and Drug Administration.  The FDA has determined that such clearance or approval is not necessary.  This test is used for clinical purposes.  It should not be regarded as investigational or for research.  This laboratory is certified under the Clinical Laboratory Improvement Amendments of 1988 (CLIA-88) as qualified to perform high complexity clinical laboratory testing. Estrogen receptor (6F11), immunohistochemical stains are performed on formalin fixed, paraffin embedded tissue using a 3,3-diaminobenzidine (DAB) chromogen and Leica Bond Autostainer System.  The staining intensity of the nucleus is scored manually and is reported as the percentage of tumor cell nuclei demonstrating specific nuclear staining.Specimens are fixed in 10% Neutral Buffered Formalin for at least 6 hours and up to  72 hours.  These tests have not be validated on decalcified tissue.  Results should be interpreted with caution given the possibility of false negative results on decalcified specimens. Ki-67 (MM1), immunohistochemical stains are performed on formalin fixed, paraffin embedded tissue using a 3,3-diaminobenzidine (DAB) chromogen and Leica Bond Autostainer System.  The staining intensity of the nucleus is scored manually and is reported as the percentage of tumor cell nuclei demonstrating specific nuclear staining.Specimens are fixed in 10% Neutral Buffered Formalin for at least 6 hours and up to 72 hours. These tests have not be validated on  decalcified tissue.  Results should be interpreted with caution given the possibility of false negative results on decalcified specimens. PR progesterone receptor (16), immunohistochemical stains are performed on formalin fixed, paraffin embedded tissue using a  3,3-diaminobenzidine (DAB) chromogen and Leica Bond Autostainer System.  Th e staining intensity of the nucleus is scored manually and is reported as the percentage of tumor cell nuclei demonstrating specific nuclear staining.Specimens are fixed in 10% Neutral Buffered Formalin for at least 6 hours and up to 72 hours. These tests have not be validated on decalcified tissue.  Results should be interpreted with caution given the possibility of false negative results on decalcified specimens. HER2 IQFISH pharmDX (code [id number]) is a direct fluorescence in-situ hybridization assay designed to quantitatively determine HER2 gene amplification in formalin-fixed, paraffin-embedded tissue specimens. Results are reported using ASCO/CAP and manufacturer's scoring criteria.  GROUP 1: Ratio of HER2/CEN 17 >=2.0 and average HER2 >=4.0 signals/cell = POSITIVE.  GROUP 2:  Ratio of HER2 /CEN 17 >=2.0 and average HER2 < 4.0 signals/cell with an IHC of 2+ = Review of 20 additional cells.  If score is still >=2.0 and average HER2 < 4.0 = NEGATIVE. GROUP 3:  Ratio of He r2/CEN 17 < 2.0 and average HER2 >=6.0 signals/cell with an IHC of 2+ = Review of 20 additional cells.  If score is still < 2.0 and average HER2 >=6.0 signals/cell= POSITIVE.  GROUP 4:  Ratio of Her2 /CEN 17 < 2.0 and average HER2 >=4.0 and < 6.0 signals/cell with an IHC score of 2+ =Review of 20 additional cells.  If score is still < 2.0 and average HER2 >=4.0 and < 6.0 signals/cell= NEGATIVE.  GROUP 5:  Ratio of HER2/CEN 17 < 2.0 and average HER2 < 4.0 signals/cell = NEGATIVE  Specimens are fixed in 10% Neutral Buffered Formalin for at least 6 hours and up to 72 hours.  These tests have not been validated on decalcified tissue.  Results should be interpreted with caution given the possibility of false negative results on decalcified specimens.  ADDENDUM Breast, left, needle core biopsy PROGNOSTIC INDICATORS Results: IMMUNOHISTOCHEMICAL AND  MORPHOMETRIC ANALYSIS PERFORMED MANUALLY The tumor cells are equivocal for Her2 (2+). Estrogen Receptor:  70%, positive, moderate  to strong staining intensity Progesterone Receptor:  15%, positive, moderate staining intensity Proliferation Marker Ki67: 10% COMMENT:  The negative hormone receptor study(ies) in this case has an internal positive control.  REFERENCE RANGE ESTROGEN RECEPTOR NEGATIVE     0% POSITIVE       =>1% REFERENCE RANGE PROGESTERONE RECEPTOR NEGATIVE     0% POSITIVE        =>1% All controls stained appropriately Pepper Dutton Md, Pathologist, Electronic Signature ( Signed 11 10 2025) 1A- FLUORESCENCE IN-SITU HYBRIDIZATION  Results: GROUP 1:  HER2 **POSITIVE**  On the tissue sample received from this individual HER2 FISH was performed by a technologist and cell imaging and analysis on the BioView. RATIO OF HER2/CEN 17 SIGNALS                          2.86 AVERAGE HER2 COPY NUMBER PER CELL      5.15 The ratio of HER2/CEN 17 result exceeds the cutoff value of >=2.0 and a copy number of HER2 signals exceeding the cutoff range of  >=4.0 signals per cell. Arch Pathol Lab Med 1:1, 2018 Belvie Come, John, Sports Administrator, International Aid/development Worker ( Signed [phone])   CLINICAL HISTORY  SPECIMEN(S) OBTAINED 1. Breast, left, needle  core biopsy, Retroareolar  SPECIMEN COMMENTS: 1. TIF: 2:10 pm, CIT: 2:09 pm SPECIMEN CLINICAL INFORMATION: 1. 48 y/o WF w/ history of treated L breast cancer    Gross Description 1. Received in formalin labeled O'Connell,Inesha and LTBR (TIF 1410, CIT 1 min) are four cores of gray white to yellow soft to firm tissue which range from 0.4 x 0.1 x 0.1 cm to 1.5 x 0.1 x 0.1 cm.  One block.  (SW:gt, 09/13/24)        Report signed out from the following location(s) [place] [address] [place], [place] [address] CLIA #: [id number]  [facility] [address] [place], [place] [address] CLIA #:  [id number]   HM MAMMOGRAPHY     Status: None   Collection Time: 09/12/24  9:57 AM  Result Value Ref Range   HM Mammogram 0-4 Bi-Rad 0-4 Bi-Rad, Self Reported Normal    Comment: abst by him  Transport Planner Screen     Status: None   Collection Time: 09/14/24  5:55 PM  Result Value Ref Range   Genetic Analysis Overall Interpretation Negative    Genetic Disease Assessed      This is a screening test and does not detect all pathogenic or likely pathogenic variant(s) in the tested genes; diagnostic testing is recommended for individuals with a personal or family history of heart disease or hereditary cancer. [facility]  Population Screen is a screening test that analyzes 11 genes related to hereditary breast and ovarian cancer (HBOC) syndrome, Lynch syndrome, and familial hypercholesterolemia. This test only reports clinically significant pathogenic and likely  pathogenic variants but does not report variants of uncertain significance (VUS). In addition, analysis of the PMS2 gene excludes exons 11-15, which overlap with a known pseudogene (PMS2CL).    Genetic Analysis Report      No pathogenic or likely pathogenic variants were detected in the genes analyzed by this test.Genetic test results should be interpreted in the context of an individual's personal medical and family history. Alteration to medical management is NOT  recommended based solely on this result. Clinical correlation is advised.Additional Considerations- This is a screening test; individuals may still carry pathogenic or likely pathogenic variant(s) in the tested genes that are not detected by this test.-  For individuals at risk for these or other related conditions based on factors including personal or family history, diagnostic testing is recommended.- The absence of pathogenic or likely pathogenic variant(s) in the analyzed genes, while reassuring,  does not eliminate the possibility of a hereditary condition; there are  other variants and genes associated with heart disease and hereditary cancer that are not included in this test.    Genes Tested See Notes     Comment: APOB, BRCA1, BRCA2, EPCAM, LDLR, LDLRAP1, PCSK9, PMS2, MLH1, MSH2, MSH6   Disclaimer See Notes     Comment: This test was developed and validated by [facility] This test has not been cleared or approved by the [place]  Food and Drug Administration (FDA). The [facility] laboratory is accredited by the College of American Pathologists (CAP) and certified under  the Clinical Laboratory Improvement Amendments (CLIA #: [id number]) to perform high-complexity clinical tests. This test is used for clinical purposes. It should not be regarded as investigational use only or for research use only.    Sequencing Location See Notes     Comment: Sequencing done at [facility] [address], [address], [place] [address] (CLIA# [id number])  Interpretation Methods and Limitations See Notes     Comment: Extracted DNA is enriched for targeted regions and then sequenced using the Helix Exome+ (R) assay on an Illumina DNA sequencing system. Data is then aligned to a modified version of GRCh38 and all genes are analyzed using the MANE transcript and MANE  Plus Clinical transcript, when available. Small variant calling is completed using a customized version of [employer]'s DNAseq software, augmented by a proprietary small variant caller for difficult variants. Copy number variants (CNVs) are then called  using a proprietary bioinformatics pipeline based on depth analysis with a comparison to similarly sequenced samples. Analysis of the PMS2 gene is limited to exons 1-10. Both the MSH2 Boland inversion (exons 1-7) and the BRCA2 Alu insertion are detected  by identifying discordant read-pairs spanning the breakpoints. The interpretation and reporting of variants in APOB, PCSK9, and LDLR is specific to familial hypercholesterolemia; variants associated with  hypobetalipoproteinemia are not i ncluded.  Interpretation is based upon guidelines published by the [employer]), the Association for Molecular Pathology (AMP) or their modification by [employer] when available and/or review  of previous clinical assertions available in the [employer]. Interpretation is limited to the transcripts indicated on the report and +/- 10 bp into intronic regions, except as noted below. Helix variant classifications include pathogenic, likely  pathogenic, variant of uncertain significance (VUS), likely benign, and benign. Only variants classified as pathogenic and likely pathogenic are included in the report. All reported variants are confirmed through secondary manual inspection of DNA  sequence data or orthogonal testing. Risk estimations and management guidelines included in this report are based on analysis of primary literature and recommendations of applicable professional societies, and should be regarded  as approximations.Based  on validation studies, this assay delivers > 99% sensitivity and specificity for single nucleotide variants and insertions and deletions (indels) up to 20 bp. Larger indels and complex variants are also reported but sensitivity may be reduced. Based on  validation studies, this assay delivers > 99% sensitivity to multi-exon CNVs and > 90% sensitivity to single-exon CNVs. This test may not detect variants in challenging regions (such as short tandem repeats, homopolymer runs, and segment duplications),  sub-exonic CNVs, chromosomal aneuploidy, or variants in the presence of mosaicism. Phasing will be attempted and reported, when possible. Structural rearrangements such as inversions, translocations, complex rearrangements, and gene conversions are not  tested in this assay unless explicitly indicated. Additionally, deep intronic, promoter, and enhancer regions may not be  covered. It is important to note that this is a screening test and cannot detect all di sease-causing variants. A negative result does  not guarantee the absence of a rare, undetectable variant in the genes analyzed; consider using a diagnostic test if there is significant personal and/or family history of one of the conditions analyzed by this test. Any potential incidental findings  outside of these genes and conditions will not be identified, nor reported. The results of a genetic test may be influenced by various factors, including bone marrow transplantation, blood transfusions, or in rare cases, hematolymphoid neoplasms.Gene  Specific Notes:APOB: analysis is limited to c.10580G>A and c.10579C>T; BRCA1: sequencing analysis extends to CDS +/-20 bp; BRCA2: analysis includes detection of [id number] and sequencing analysis extends to CDS +/-20 bp. EPCAM: analysis is limited  to CNV of exons 8-9; LDLR: analysis includes CNV of the promoter; MLH1: analysis includes CNV of the promoter; MSH2: analysis includes detection of the [facility]  inversion (inversion of exons 1 -7) and detection of c.942+3A>T, PMS2: analysis is limited to  exons 1-10.Donnice JINNY Kemp, PhD, [email or web address]   CMP (Cancer Center only)     Status: None   Collection Time: 09/16/24 11:46 AM  Result Value Ref Range   Sodium 136 135 - 145 mmol/L   Potassium 4.0 3.5 - 5.1 mmol/L   Chloride 104 98 - 111 mmol/L   CO2 26 22 - 32 mmol/L    Comment: (NOTE) Elevated LDH levels may cause falsely increased CO2 results. If LDH is >2000 U/L, a positive bias of 12% is possible.     Glucose, Bld 96 70 - 99 mg/dL    Comment: Glucose reference range applies only to samples taken after fasting for at least 8 hours.   BUN 11 6 - 20 mg/dL   Creatinine 9.12 9.55 - 1.00 mg/dL   Calcium  10.0 8.9 - 10.3 mg/dL   Total Protein 7.6 6.5 - 8.1 g/dL   Albumin 4.7 3.5 - 5.0 g/dL   AST 17 15 - 41 U/L   ALT 14 0 - 44 U/L   Alkaline  Phosphatase 54 38 - 126 U/L   Total Bilirubin 0.4 0.0 - 1.2 mg/dL   GFR, Estimated >39 >39 mL/min    Comment: (NOTE) Calculated using the CKD-EPI Creatinine Equation (2021)    Anion gap 6 5 - 15    Comment: Performed at [facility], [address] [place], [place] [address]  CBC with Differential (Cancer Center Only)     Status: None   Collection Time: 09/16/24 11:46 AM  Result Value Ref Range   WBC Count 4.7 4.0 - 10.5 K/uL   RBC 4.55 3.87 - 5.11 MIL/uL   Hemoglobin 13.9 12.0 - 15.0 g/dL   HCT 60.5 63.9 - 53.9 %   MCV 86.6 80.0 - 100.0 fL   MCH 30.5 26.0 - 34.0 pg   MCHC 35.3 30.0 - 36.0 g/dL   RDW 87.6 88.4 - 84.4 %   Platelet Count 262 150 - 400 K/uL   nRBC 0.0 0.0 - 0.2 %   Neutrophils Relative % 57 %   Neutro Abs 2.7 1.7 - 7.7 K/uL   Lymphocytes Relative 29 %   Lymphs Abs 1.4 0.7 - 4.0 K/uL   Monocytes Relative 10 %   Monocytes Absolute 0.5 0.1 - 1.0 K/uL   Eosinophils Relative 3 %   Eosinophils Absolute 0.1 0.0 - 0.5 K/uL   Basophils Relative 1 %   Basophils Absolute 0.1 0.0 - 0.1 K/uL   Immature Granulocytes 0 %   Abs Immature Granulocytes 0.01 0.00 - 0.07 K/uL    Comment: Performed at [facility], [address] [place], [place] [address]     Imaging/Pathology:        Assessment: In summary, this is a pleasant 48 y.o. 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 Ebbie and Dr. Waddell.  I had a long discussion with the patient regarding her options for breast reconstruction. All this information discussed is included in a patient education document given to the patient.   The patient was advised that timing of reconstruction can be either immediate (at the time of mastectomy), or  delayed (at a separate operation after the mastectomy), and there are  advantages and disadvantages to both, depending upon the need for additional cancer treatment. We discussed that breast reconstruction has limitations. We discussed that a reconstruction can have shape issues, be painful and is largely insensate. It is never the same as a natural breast. The general surgical risks discussed included wound infections, bleeding, scarring, chronic pain, fluid build up (seroma), and wound breakdown needing wound care, sometimes even a wound vac. Death can occur with any surgery. Sometimes a blood clot (DVT) can develop that may travel to the lungs (PE) and can be fatal. The mastectomy can have skin loss not infrequently and this can have negative implications for our reconstruction results both aesthetically and complications wise, especially increasing the risk of implant removal and the need for skin removal leading to poor aesthetics. Sometimes chronic pain syndromes can ensue that are difficult to manage. Aesthetic outcomes were discussed in detail and the patient understands that asymmetries are common, aesthetic disappointment is possible and that revision rates are very high after all types of reconstruction to try get the result as best as possible. The patient was told that there are limitations of reconstruction, limited sometimes by the patient's characteristics and how they heal and that outcomes sometimes are unpredictable.  1. Implant based reconstruction:  The usual process of placing an expander and postoperative care was discussed. We also discussed the option of DTI (direct to implant) reconstruction. We discussed the potential use of "graft" material and the potential of a slight increase in infection. We contrasted this to the benefits of its use also. We compared and contrasted the different implants and the FDA concerns and recommendations. Specifically the patient understands that silicone  implants are not implicated in autoimmune diseases but that there is a potential risk of a condition called ALCL, a lymphoma type condition that occurs particularly with the use of textures implants. The FDA and ASPS has not changed their stance on implants. Surgical risks besides the risks above, that were discussed, included the following, infection leading to possible loss of the expander/implant, chronic pain, aesthetic complications such as rippling, asymmetry and animation (unwanted jumping or squashing of the implant when the chest muscle contracts). We also discussed the common complication of capsular contracture that often will need revisional surgery and can be painful. We quoted national data from our society of 40% revision rates at 7 years and that revisions in other two stage or DTI reconstruction was common. We discussed pre-pectoral reconstruction, placing the devices in front of the muscle, and that this is fairly new. We and others think it has many benefits including preventing the animation deformity and postoperative pain is much improved. The deleterious effects of radiation were discussed and that in this scenario we would only consider expanders, not direct to implant and not a flap initially. It is possible to do reconstruction but the patient understands that this has a high rate of complications.  After radiation in delayed reconstruction scenarios, a flap such as a DIEP or a back (LDMF) flap are most often needed. The risks associated with implant based breast reconstruction were discussed including: infection, seroma, hematoma, skin flap/nipple necrosis, change/loss of breast or nipple sensation, implant failure, silicone concerns, capsular contracture, Anaplastic large cell lymphoma, asymmetry, the need for secondary and tertiary procedures, donor site morbidity, scars, need for possible drains and postoperative antibiotics, fat necrosis, pain, reconstruction failure, DVT/PE, stroke  and heart attack.   2. DIEP Autologous tissue reconstruction with a DIEP (tummy fat and skin) flap was described  and discussed in detail. For patients with adequate fatty tissue of the lower abdomen this is a good option. The usual postoperative recovery of 6-8 weeks and sometimes longer was discussed and the usual postoperative course was discussed.  Skin loss from the mastectomy, which is not uncommon, can affect our reconstruction efforts negatively by forcing us  to use a much bigger skin island, having higher risks of infection and skin breakdown for example and even compromising the flap survival. Risks associated with autologous based breast reconstruction were discussed including: bleeding, infection, seroma, scarring, vessel thrombosis, partial or total flap loss, asymmetry, need for secondary and tertiary procedures, fat necrosis, DVT/PE, stroke and heart attack. During surgery, the mastectomy flaps are interrogated to determine their vascularity and perfusion status. Should the mastectomy flaps show sign of compromise, an intraoperative decision may be made to delay reconstruction. Risks discussed over and above the usual risks discussed included but were not limited to complete flap failure and the need for another type of reconstruction, chronic pain, abdominal bulge or hernia, scarring, skin breakdown, seroma (fluid build up); all sometimes needing further intervention including surgery. Hard areas (fat necrosis) can develop which are only dealt with if they are noticeable or painful. Wound complications are common and very common in ladies that have a higher BMI. I discussed this risk as being almost 100% but that the flap is still a good option given that implants have high risks also in these patients. Bleeding, infection and blood clots that could travel to the lungs are possible and walking after surgery is very important to help prevent these clots. Asymmetries, irregularities, size issues,  projection issues and other shape concerns can arise and often do, that sometimes are amenable to revisions but sometimes are just a limitation of the outcome. We discussed that symmetry procedures are often necessary and are part of the reconstructive process.   3. The Latissimus Dorsi flap (Back Muscle Flap) with an expander was discussed. The patient understands the procedure, postop course and use of the different types of implants. The risk, discussed are similar to the other types of implant, expander based reconstruction except that seroma (fluid build up) risk of 18% was highlighted. This sometimes can require further surgery. Sometimes there is a risk of decreased shoulder mobility and chronic pain is possible but this is usually well tolerated form of reconstruction.   4. We also discussed the [facility] Health Act of 1998. All the questions were answered. The patient has all the information to make an informed decision and has a good understanding of risks, benefits and limitations.  She has all the prerequisite information to make an informed decision.   5. Opioids: With regard to opioid prescriptions, the laws have been discussed, the patient has been thoroughly evaluated. We often use a multi modal approach to pain management. If further unanticipated prescribing of opioids is needed, the patient will need to visit her primary doctor or a pain specialist.      In summary, this is a very pleasant 48 y.o. F with newly diagnosed breast who presents to discuss breast reconstruction in anticipation ofbilateral mastectomies with tissue expander placement by Dr. Ebbie and Dr. Waddell. She will require adjuvant therapy.    We have agreed upon staged reconstruction with the first stage being immediate bilateral tissue expander placement with ADM by Dr. Waddell. Patient will follow up with me after chemotherapy to plan for bilateral DIEP flap reconstruction.   The time documented represents the total  time spent  on the day of the encounter in preparing for and completing the visit. It does not include time spent by ancillary staff, a resident, a fellow, another trainee, or, for shared visits, time spent jointly with the patient or discussing the case or the performance of other separately performed services.   Time spent: 45 minutes.    Jathniel Smeltzer, MD [facility]   09/26/2024 8:24 AM

## 2024-09-26 NOTE — Assessment & Plan Note (Signed)
 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   Pathology: Lumpectomy at [facility] on 01/29/2015: residual microscopic invasive ductal carcinoma spanning 3.2 cm T2 N0 M0 Pathological stage II a   Treatment summary: Neoadjuvant chemotherapy with Taxotere , carboplatin , Herceptin  and Perjeta  given once every 3 weeks from 09/19/2014 to 01/02/2015 .Adjuvant radiation therapy completed June 2016; Herceptin  every 3 weeks completed 12/17/2015 started tamoxifen  06/05/2015. Tamoxifen  started 06/05/2015   Current treatment: Tamoxifen  10 mg daily     Recurrence: Mammogram 09/12/2024:  Right breast calcifications biopsy: DCIS with 2 foci of microinvasion less than 1 mm, intermediate grade, ER 100%, PR 10% Retroareolar left breast: 1 cm mass, biopsy: Grade 3 IDC, no LVI, ER 70%, PR 15%, Ki67 10%, HER2 2+ by IHC, FISH positive  CT CAP 09/23/2024: No evidence of metastatic disease, right lower pole renal mass 3.4 x 3 cm Bone scan 09/23/2024: Benign  Recommendation: Bilateral mastectomies Since the final pathology is ER/PR and HER2 positive, I recommend adjuvant [facility] consultation regarding the kidney lesion

## 2024-09-27 ENCOUNTER — Encounter: Payer: Self-pay | Admitting: *Deleted

## 2024-09-27 NOTE — Progress Notes (Signed)
 Per MD request RN successfully faxed referral to Dr. Renda with [facility] [phone]).

## 2024-10-10 ENCOUNTER — Inpatient Hospital Stay: Attending: Hematology and Oncology | Admitting: Licensed Clinical Social Worker

## 2024-10-10 ENCOUNTER — Encounter: Payer: Self-pay | Admitting: *Deleted

## 2024-10-10 NOTE — Progress Notes (Signed)
 [facility] CSW Counseling Note  Patient was referred by nurse navigator. Treatment type: Individual  Presenting Concerns: Patient and/or family reports the following symptoms/concerns: anxiety and stress Duration of problem: since dx October 2025; Severity of problem: severe   Orientation:oriented to person, place, time/date, and situation.   Affect: Appropriate, Congruent, and Tearful Risk of harm to self or others: No plan to harm self or others  Patient and/or Family's Strengths/Protective Factors: Social and Emotional competence and Concrete supports in place (healthy food, safe environments, etc.)Ability for insight  Capable of independent living  Communication skills  General fund of knowledge  Motivation for treatment/growth  Special hobby/interest  Supportive family/friends      Goals Addressed: Patient will:  Reduce symptoms of: anxiety and stress Increase knowledge and/or ability of: coping skills  Increase healthy adjustment to current life circumstances   Progress towards Goals: Progressing   Interventions: Interventions utilized:  Mindfulness or Management Consultant, CBT Cognitive Behavioral Therapy, and Psychoeducation and/or Health Education  No updated PHQ9/GAD7 today     09/23/2024    9:49 AM 04/06/2023    8:49 AM 01/03/2023   10:16 AM  PHQ9 SCORE ONLY  PHQ-9 Total Score 10 1  1       Data saved with a previous flowsheet row definition       09/23/2024    9:48 AM 04/06/2023    8:49 AM 01/03/2023   10:17 AM  GAD 7 : Generalized Anxiety Score  Nervous, Anxious, on Edge 3 1 0  Control/stop worrying 3 0 0  Worry too much - different things 2 0 1  Trouble relaxing 2 0 0  Restless 0 0 1  Easily annoyed or irritable 3 0 0  Afraid - awful might happen 3 0 0  Total GAD 7 Score 16 1 2   Anxiety Difficulty Very difficult Somewhat difficult Not difficult at all       Assessment: Patient continues to have high stress and tearfulness related to cancer and  uncertainty with next steps. She has met with plastic surgeons about options and is leaning toward immediate reconstruction with flap but the issue is finding someone who can do that, which may require travel.  There is also the ongoing uncertainty with what treatment she will need post-mastectomy & reconstruction.  Pt is having more difficulty focusing at work and is more tearful while sitting in the unknown. She is maintaining personal relationships and is trying to engage in enjoyable activities, like reading. CSW provided psychoeducation on the body and stress. Discussed different paths for managing this, including behavioral activation, cognitive restructuring, gratitude practice. Pt is feeling more than noticing specific thoughts at this time.  Made a plan for pt to re-engage in box breathing or yoga breath. Practiced grounding with senses and used color naming today.  Pt found mantras more difficult today as they did not feel genuine.   Plan: Follow up with CSW: 10/29/24 Behavioral recommendations: Allow yourself time to think and plan for next step for surgery. When it is not that time (like when working), practice box breath- add reminders on your phone/calendar as you restart this practice. Utilize color naming (ex: 3 blue, 3 non-red) to help ground yourself.  Referral(s):        Kim Samano E Mariabelen Pressly, Kim Armstrong

## 2024-10-10 NOTE — Progress Notes (Signed)
 Navigator called to check in with patient. She states she is still in the process of deciding the type/where to have her surgery done. She is leaning towards traveling out of town and having it done in early/mid January.   She is aware that she will have a f/u appt with Dr. Odean about 2 weeks after surgery to discuss pathology. I told her once surgery date is set I will have that set up. She has urology consult with Dr. Napoleon on 1/6. She has no questions for navigator at this time but has my contact info if anything arises.

## 2024-10-28 ENCOUNTER — Other Ambulatory Visit: Payer: Self-pay | Admitting: General Surgery

## 2024-10-28 NOTE — Progress Notes (Signed)
 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 She underwent primary TCHP followed by lumpectomy and a sentinel node biopsy at [facility].Kim Armstrong She had a complete pathologic response. She then underwent radiotherapy completed her anti-HER2 therapy and did tamoxifen  for 7 years. She has had no issues since. She works here in [place]. She has a family history of breast cancer in her mother at a young age. She has negative genetics at the last cancer. She underwent a mammogram that shows her to have C density breast tissue. There are some grouped calcifications in the right breast measuring 7 mm. These are located in the anterior depth of the lower inner quadrant. She underwent a biopsy of this. This shows her to have ductal carcinoma in situ with 2's foci of microinvasion. This is 100% er pos, 10% pr pos Since then she has undergone cem that shows a 1x1x0.8 cm subareolar mass and this measures 1x0.6x1 cm on US . US  of both axillae are negative biopsy of the left breast mass is III IDC that is 70% er pos, 15% pr pos, her 2 pos and Ki is 10%. She has had plastic surgery consults at this point and returns for final plan  Review of Systems: A complete review of systems was obtained from the patient.  I have reviewed this information and discussed as appropriate with the patient.  See HPI as well for other ROS.  Review of Systems  All other systems reviewed and are negative.     Medical History: Past Medical History:  Diagnosis Date   GERD (gastroesophageal reflux disease)    History of cancer     Hyperlipidemia     Patient Active Problem List  Diagnosis   Bilateral malignant neoplasm of breast in female (CMS/HHS-HCC)    Past Surgical History:  Procedure Laterality Date   .endometrial ablation     MASTECTOMY PARTIAL / LUMPECTOMY       No Known Allergies  Current Outpatient Medications on File Prior to Visit  Medication Sig Dispense Refill   EMGALITY PEN 120 mg/mL PnIj INJECT 1 ML INTO THE SKIN EVERY 30 DAYS.     rizatriptan (MAXALT) 5 MG tablet Take 5 mg by mouth as directed for Migraine May take a second dose after 2 hours if needed.     UBRELVY 100 mg Tab Take 100 mg by mouth at bedtime as needed     No current facility-administered medications on file prior to visit.    Family History  Problem Relation Age of Onset   Hyperlipidemia (Elevated cholesterol) Mother    Breast cancer Mother    Colon cancer Father      Social History   Tobacco Use  Smoking Status Never  Smokeless Tobacco Never     Social History   Socioeconomic History   Marital status: Married  Tobacco Use   Smoking status: Never   Smokeless tobacco: Never  Vaping Use   Vaping status: Unknown  Substance and Sexual Activity   Alcohol use: Yes    Alcohol/week: 0.0 - 1.0 standard drinks of alcohol   Drug use:  Never   Sexual activity: Yes    Partners: Male   Social Drivers of Health   Financial Resource Strain: Low Risk (01/18/2023)   Received from [facility]   Overall Financial Resource Strain (CARDIA)    Difficulty of Paying Living Expenses: Not hard at all  Food Insecurity: No Food Insecurity (01/18/2023)   Received from [facility]   Hunger Vital Sign    Within the past 12 months, you worried that your food would run out before you got the money to buy more.: Never true    Within the past 12 months, the food you bought just didn't last and you didn't have money to get more.: Never true  Transportation Needs: No Transportation Needs (01/18/2023)   [facility]    Lack of Transportation (Medical): No    Lack of Transportation (Non-Medical): No  Physical Activity: Sufficiently Active (01/18/2023)   Received from [facility]   Exercise Vital Sign    On average, how many days per week do you engage in moderate to strenuous exercise (like a brisk walk)?: 3 days    On average, how many minutes do you engage in exercise at this level?: 50 min  Stress: No Stress Concern Present (01/18/2023)   Received from [facility] - Occupational Stress Questionnaire    Feeling of Stress : Only a little  Social Connections: Socially Integrated (01/18/2023)   Received from [facility]   Social Network    How would you rate your social network (family, work, friends)?: Good participation with social networks  Housing Stability: Unknown (09/12/2024)   Housing Stability Vital Sign    Homeless in the Last Year: No    Objective:    Vitals:   10/28/24 1043  PainSc: 0-No pain    There is no height or weight on file to calculate BMI.  Physical Exam   Deferred today   Assessment and Plan:     Diagnoses and all orders for this visit:  Bilateral malignant neoplasm of breast in female, unspecified estrogen receptor status, unspecified site of breast (CMS/HHS-HCC)  bilateral mastectomies and bilateral ax sn biopsies   We discussed today that for her 2 pos tumor on the left after prior bct/radiotherapy she really needs mastectomy could try something different to salvage breast if different prognostic panel but she needs mastectomy and attempt to repeat left ax sn.  Discussed that- don't think she is nsm candidate due to tumor and breast.  Will  plan for bilateral ssm and sn biopy on  left combined with TE here. Oncology would like to hold on port until we get final pathology      MATTHEW CARLIN BURY, MD     "

## 2024-10-29 ENCOUNTER — Inpatient Hospital Stay: Admitting: Licensed Clinical Social Worker

## 2024-10-29 NOTE — Progress Notes (Signed)
 [facility] CSW Counseling Note  Patient was referred by nurse navigator. Treatment type: Individual  Presenting Concerns: Patient and/or family reports the following symptoms/concerns: anxiety and stress Duration of problem: since dx October 2025; Severity of problem: moderate   Orientation:oriented to person, place, time/date, and situation.   Affect: Appropriate and Congruent Risk of harm to self or others: No plan to harm self or others  Patient and/or Family's Strengths/Protective Factors: Social and Emotional competence and Concrete supports in place (healthy food, safe environments, etc.)Ability for insight  Capable of independent living  Communication skills  General fund of knowledge  Motivation for treatment/growth  Special hobby/interest  Supportive family/friends      Goals Addressed: Patient will:  Reduce symptoms of: anxiety and stress Increase knowledge and/or ability of: coping skills  Increase healthy adjustment to current life circumstances   Progress towards Goals: Progressing   Interventions: Interventions utilized:  CBT Cognitive Behavioral Therapy  No updated PHQ9/GAD7 today     09/23/2024    9:49 AM 04/06/2023    8:49 AM 01/03/2023   10:16 AM  PHQ9 SCORE ONLY  PHQ-9 Total Score 10 1  1       Data saved with a previous flowsheet row definition       09/23/2024    9:48 AM 04/06/2023    8:49 AM 01/03/2023   10:17 AM  GAD 7 : Generalized Anxiety Score  Nervous, Anxious, on Edge 3 1 0  Control/stop worrying 3 0 0  Worry too much - different things 2 0 1  Trouble relaxing 2 0 0  Restless 0 0 1  Easily annoyed or irritable 3 0 0  Afraid - awful might happen 3 0 0  Total GAD 7 Score 16 1 2   Anxiety Difficulty Very difficult Somewhat difficult Not difficult at all       Assessment: Patient reports improved anxiety since last visit with decision made about surgery and after returning from family trip to [place]. She spoke with Dr. Ebbie  and will move forward with mastectomies and expander with plan for delayed flap reconstruction.  Pt feels this will be best for her under the current circumstances. Processed more today about fears about impact on relationship that is driven by relationship dynamics during her last bout with cancer when she was married to her first husband.  Discussed grounding skill of stating the date, where she is and who she is with now if she finds herself stuck in the past.  Discussed ways to continue communicating with her current spouse.   Work focus is somewhat improved with using box breath. Pt does have concern that there may be underlying ADHD and plans to look more into this at a later date.   Plan: Follow up with CSW: 11/20/2023 - will complete PHQ & GAD again Behavioral recommendations: Continue box breath & color naming. Add in grounding statements of where you are when feeling pulled into the past. Continue open communication with your partner  Referral(s):        Kim Coltrane E Zarya Lasseigne, Kim Armstrong

## 2024-10-31 NOTE — Addendum Note (Signed)
 Addended by: EUSTACIO POUR on: 10/31/2024 12:19 PM   Modules accepted: Orders

## 2024-11-11 ENCOUNTER — Encounter: Payer: Self-pay | Admitting: Genetic Counselor

## 2024-11-11 ENCOUNTER — Inpatient Hospital Stay: Attending: Hematology and Oncology | Admitting: Genetic Counselor

## 2024-11-11 ENCOUNTER — Inpatient Hospital Stay

## 2024-11-11 DIAGNOSIS — C50411 Malignant neoplasm of upper-outer quadrant of right female breast: Secondary | ICD-10-CM

## 2024-11-11 DIAGNOSIS — Z803 Family history of malignant neoplasm of breast: Secondary | ICD-10-CM

## 2024-11-11 DIAGNOSIS — Z170 Estrogen receptor positive status [ER+]: Secondary | ICD-10-CM

## 2024-11-11 DIAGNOSIS — Z853 Personal history of malignant neoplasm of breast: Secondary | ICD-10-CM

## 2024-11-11 LAB — GENETIC SCREENING ORDER

## 2024-11-11 NOTE — Progress Notes (Signed)
 REFERRING PROVIDER: Odean Potts, MD  PRIMARY PROVIDER:  Theophilus Andrews, Tully GRADE, MD  PRIMARY REASON FOR VISIT:  1. Malignant neoplasm of upper-outer quadrant of left breast in female, estrogen receptor positive (HCC)   2. Family history of malignant neoplasm of breast      HISTORY OF PRESENT ILLNESS:   Kim Armstrong, a 49 y.o. female, was seen for a  cancer genetics consultation at the request of Odean Potts, MD due to a personal and family history of cancer.  Kim Armstrong presents to clinic today to discuss the possibility of a hereditary predisposition to cancer, to discuss genetic testing, and to further clarify her future cancer risks, as well as potential cancer risks for family members.   In 2015, at the age of 89, Kim Armstrong was diagnosed with left breast cancer, IDC ER+, PR+, Her2+. She was treated with neoadjuvant chemotherapy and lumpectomy/SLNB, adjuvant RT, tamoxifen . In 2025, she was diagnosed with right DCIS, ER+, PR+, Her2+. She is currently planning for a bilateral mastectomy with Dr. Ebbie, scheduling pending. Plan for repeat analysis of Her2 on final pathology specimen to determine next steps.   Urology consultation tomorrow for incidentally identified kidney nodule.   CANCER HISTORY:  Oncology History  Breast cancer of upper-outer quadrant of left female breast (HCC)  08/20/2014 Initial Biopsy   Left breast bx: Invasive ductal carcinoma grade 3, ER+ (100%), PR+ (89%) HER-2 positive ratio 3.5, Ki-67 25%; biopsy of satellite lesion fibroadenoma   08/26/2014 Breast MRI   Left breast: Upper-outer quadrant 1.4 cm from nipple 1.9 x 2.5 x 2.6 cm enhancing mass with the 8 mm nodule 1 cm posterior Right breast: 1.4 x 1.6 cm mass upper outer quadrant biopsy proven fibroadenoma   08/26/2014 Clinical Stage   Stage IIA: T2 N0   09/19/2014 -  Neo-Adjuvant Chemotherapy   Neoadjuvant chemotherapy with Taxotere , carboplatin , Herceptin  and Perjeta  x6 cycles  followed by Herceptin  maintenance to be complete November 2016   09/26/2014 Procedure   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 Otherwise negative at ATM, BARD1, BRCA1, BRCA2, BRIP1, CDH1, CHEK2, EPCAM, MLH1, MRE11A, MSH2, MSH6, MUTYH, NBN, NF1, PALB2, PMS2, PTEN, RAD50, RAD51C, RAD51D, SMARCA4, STK11, and TP53.   01/09/2015 Breast MRI   Interval marked response to chemotherapy with significant decrease in the size of the tumor, satellite nodule is now vaguely visible, 2.6 cm mass is now 0.9 cm   01/29/2015 Definitive Surgery   Left breast lumpectomy/SLNB by Dr. Iva at [facility]; 3.2 cm area of microscopic IDC cells, 2 sentinel lymph nodes negative (0/2). 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   Left breast boost/ 16 Gy at 2 Gy per fraction x 8 fractions   06/05/2015 -  Anti-estrogen oral therapy   Tamoxifen  20 mg daily. Planned duration of treatment 5-10 years.   11/03/2015 Survivorship   Survivorship care visit completed and copy of care plan provided to patient.   09/12/2024 Relapse/Recurrence   Screening mammogram detected right breast calcifications:biopsy: DCIS with 2 foci of microinvasion less than 1 mm, intermediate grade, ER 100%, PR 10% Retroareolar left breast: 1 cm mass, biopsy: Grade 3 IDC, no LVI, ER 70%, PR 15%, Ki67 10%, HER2 2+ by IHC, FISH Pending      RELEVANT MEDICAL HISTORY:  Ovaries intact: yes.  Uterus intact: yes.  Negative OvaNext 24 gene panel in 2015. 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 Otherwise negative at ATM, BARD1, BRCA1, BRCA2, BRIP1, CDH1, CHEK2, EPCAM, MLH1, MRE11A, MSH2, MSH6, MUTYH, NBN, NF1, PALB2, PMS2, PTEN, RAD50, RAD51C, RAD51D, SMARCA4, STK11, and TP53. DNA analysis only.   Past Medical History:  Diagnosis Date   Endometrial mass    GERD (gastroesophageal  reflux disease)    History of chemotherapy    09-19-2014  to 11/ 2016  left breast cancer   History of external beam radiation therapy    03-10-2015  to 04-27-2015  left breast cancer   Malignant neoplasm of upper-outer quadrant of left breast in female, estrogen receptor positive (HCC) 08/2014   oncologist--- dr odean;   dx 10/ 2015;   Stage IIA,  chemo 09-19-2014  to 11/ 2016 ;   01-29-2015 left breast lumpectomy w/ sln bx;   IMRT completed 04-27-2015   Menorrhagia    Migraine without aura and without status migrainosus, not intractable    neurologist--  ladonna cook NP    Past Surgical History:  Procedure Laterality Date   BREAST LUMPECTOMY WITH AXILLARY LYMPH NODE BIOPSY Left 01/29/2015   DILATATION & CURETTAGE/HYSTEROSCOPY WITH MYOSURE N/A 09/28/2022   Procedure: DILATATION & CURETTAGE/HYSTEROSCOPY WITH MYOSURE;  Surgeon: Rosalva Sawyer, MD;  Location: [facility];  Service: Gynecology;  Laterality: N/A;   DILITATION & CURRETTAGE/HYSTROSCOPY WITH HYDROTHERMAL ABLATION N/A 09/28/2022   Procedure: DILATATION & CURETTAGE/HYSTEROSCOPY WITH HYDROTHERMAL ABLATION;  Surgeon: Rosalva Sawyer, MD;  Location: [facility];  Service: Gynecology;  Laterality: N/A;   ECTOPIC PREGNANCY SURGERY  05/2009   @[facility] ;    laparoscopic turned laparotomy w/ right salpingectomy    Social History   Socioeconomic History   Marital status: Married    Spouse name: Not on file   Number of children: 1   Years of education: Not on file   Highest education level: Not on file  Occupational History   Not on file  Tobacco Use   Smoking status: Former    Current packs/day: 0.00    Average packs/day: 1 pack/day for 2.0 years (2.0 ttl pk-yrs)    Types: Cigarettes    Start date: 02/18/1993    Quit date: 02/19/1995    Years since quitting: 29.7   Smokeless tobacco: Never  Vaping Use   Vaping status: Never Used  Substance and Sexual Activity   Alcohol use: Yes    Comment: occasional    Drug use: Never   Sexual activity: Not on file  Other Topics Concern   Not on file  Social History Narrative   Not on file   Social Drivers of Health   Tobacco Use: Low Risk  (10/28/2024)   Received from [facility]   Patient History    Smoking Tobacco Use: Never    Smokeless Tobacco Use: Never    Passive Exposure: Not on file  Recent Concern: Tobacco Use - Medium Risk (09/26/2024)   Patient History    Smoking Tobacco Use: Former    Smokeless Tobacco Use: Never    Passive Exposure: Not on file  Financial Resource Strain: Low Risk (01/18/2023)   Received from [facility]   Overall Financial Resource Strain (CARDIA)    Difficulty of Paying Living Expenses: Not hard at all  Food Insecurity: No Food Insecurity (01/18/2023)   Received from [facility]   Epic    Within the past 12 months, you worried that your food would run out  before you got the money to buy more.: Never true    Within the past 12 months, the food you bought just didn't last and you didn't have money to get more.: Never true  Transportation Needs: No Transportation Needs (01/18/2023)   Received from [facility]   PRAPARE - Transportation    Lack of Transportation (Medical): No    Lack of Transportation (Non-Medical): No  Physical Activity: Sufficiently Active (01/18/2023)   Received from [facility]   Exercise Vital Sign    On average, how many days per week do you engage in moderate to strenuous exercise (like a brisk walk)?: 3 days    On average, how many minutes do you engage in exercise at this level?: 50 min  Stress: No Stress Concern Present (01/18/2023)   Received from [facility] - Occupational Stress Questionnaire    Feeling of Stress : Only a little  Social Connections: Socially Integrated (01/18/2023)   Received from [facility]   Social Network    How would you rate your social network (family, work, friends)?: Good participation  with social networks  Depression (PHQ2-9): Medium Risk (09/23/2024)   Depression (PHQ2-9)    PHQ-2 Score: 10  Alcohol Screen: Not on file  Housing: Unknown (09/12/2024)   Received from [facility]   Epic    Unable to Pay for Housing in the Last Year: Not on file    Number of Times Moved in the Last Year: Not on file    At any time in the past 12 months, were you homeless or living in a shelter (including now)?: No  Utilities: Not At Risk (01/18/2023)   Received from [facility]    Threatened with loss of utilities: No  Health Literacy: Not on file     FAMILY HISTORY:  We obtained a detailed, 4-generation family history.  Significant diagnoses are listed below: Family History  Problem Relation Age of Onset   Breast cancer Mother 52       unilateral   Colon cancer Father 8   Colon polyps Neg Hx    Esophageal cancer Neg Hx    Stomach cancer Neg Hx     Ms. Fitzwater is unaware of previous family history of genetic testing for hereditary cancer risks. There is no reported Ashkenazi Jewish ancestry.    GENETIC COUNSELING ASSESSMENT: Ms. Stroope is a 49 y.o. female with a personal and family history of cancer which is somewhat suggestive of a hereditary predisposition to cancer given Ms. Sawa's personal history of bilateral breast cancer and family history of a first degree relative with breast cancer under the age of 72. We, therefore, discussed and recommended the following at today's visit.   DISCUSSION: We discussed that 5 - 10% of cancer is hereditary, with most cases of hereditary breast cancer associated with pathogenic variants in BRCA1/2.  There are other genes that can be associated with hereditary breast cancer syndromes.  We discussed that testing is beneficial for several reasons including knowing how to follow individuals after completing their treatment, identifying whether potential treatment options would be beneficial, and  understanding if other family members could be at risk for cancer and allowing them to undergo genetic testing. Given normal testing in 2015 that covered high penetrance breast cancer genes (BRCA1, BRCA2, CDH1, PALB2, PTEN, STK11, TP53), yield of testing is expected to be low. However, updated testing will include RNA analysis, which increases detection  rate by approximately 1%.   We reviewed the characteristics, features and inheritance patterns of hereditary cancer syndromes. We also discussed genetic testing, including the appropriate family members to test, the process of testing, insurance coverage and turn-around-time for results. We discussed the implications of a negative, positive, carrier and/or variant of uncertain significant result.   Ms. Docter  was offered a common hereditary cancer panel (40+ genes) and an expanded pan-cancer panel (70+ genes). Ms. Chiasson was informed of the benefits and limitations of each panel, including that expanded pan-cancer panels contain genes that do not have clear management guidelines at this point in time.  We also discussed that as the number of genes included on a panel increases, the chances of variants of uncertain significance increases. After considering the benefits and limitations of each gene panel, Ms. Wilton elected to have [facility] CancerNext +RNAinsight panel. The [facility] CancerNext+RNAinsight Panel includes sequencing, rearrangement analysis, and RNA analysis for the following 40 genes: APC, ATM, BAP1, BARD1, BMPR1A, BRCA1, BRCA2, BRIP1, CDH1, CDKN2A, CHEK2, FH, FLCN, MET, MLH1, MSH2, MSH6, MUTYH, NF1, NTHL1, PALB2, PMS2, PTEN, RAD51C, RAD51D, RPS20, SMAD4, STK11, TP53, TSC1, TSC2 and VHL (sequencing and deletion/duplication); AXIN2, HOXB13, MBD4, MSH3, POLD1 and POLE (sequencing only); EPCAM and GREM1 (deletion/duplication only).   Based on Ms. Dockham's personal and family history of cancer, she meets medical criteria for genetic testing.  Despite that she meets criteria, she may still have an out of pocket cost. We discussed that if her out of pocket cost for testing is over $100, the laboratory should contact them to discuss self-pay prices, patient pay assistance programs, if applicable, and other billing options.  We discussed that some people do not want to undergo genetic testing due to fear of genetic discrimination.  A federal law called the Genetic Information Non-Discrimination Act (GINA) of 2008 helps protect individuals against genetic discrimination based on their genetic test results.  It impacts both health insurance and employment.  With health insurance, it protects against increased premiums, being kicked off insurance or being forced to take a test in order to be insured.  For employment it protects against hiring, firing and promoting decisions based on genetic test results.  GINA does not apply to those in the [employer], those who work for companies with less than 15 employees, and new life insurance or long-term disability insurance policies.  Health status due to a cancer diagnosis is not protected under GINA.  PLAN: After considering the risks, benefits, and limitations, Ms. Colaizzi provided informed consent to pursue genetic testing and the blood sample was sent to [facility] for analysis of the CancerNext +RNA panel. Results should be available within approximately 2-3 weeks' time, at which point they will be disclosed by telephone to Ms. Hunkele, as will any additional recommendations warranted by these results. Ms. Hedgecock will receive a summary of her genetic counseling visit and a copy of her results once available. This information will also be available in Epic.   Lastly, we encouraged Ms. Alkins to remain in contact with cancer genetics annually so that we can continuously update the family history and inform her of any changes in cancer genetics and testing that may be of benefit for this  family.   Ms. Andy questions were answered to her satisfaction today. Our contact information was provided should additional questions or concerns arise. Thank you for the referral and allowing us  to share in the care of your patient.   Burnard Ogren, MS, [facility] Licensed, Certified Genetic  Counselor [email or web address] phone: [phone]   40 minutes were spent on the date of the encounter in service to the patient including preparation, face-to-face consultation, documentation and care coordination.  The patient was seen alone.  Drs. Gudena and/or Lanny were available to discuss this case as needed.   _______________________________________________________________________ For Office Staff:  Number of people involved in session: 1 Was an Intern/ student involved with case: no

## 2024-11-13 ENCOUNTER — Other Ambulatory Visit (HOSPITAL_COMMUNITY): Payer: Self-pay | Admitting: Urology

## 2024-11-13 DIAGNOSIS — N2889 Other specified disorders of kidney and ureter: Secondary | ICD-10-CM

## 2024-11-14 ENCOUNTER — Other Ambulatory Visit: Payer: Self-pay

## 2024-11-14 ENCOUNTER — Encounter (HOSPITAL_BASED_OUTPATIENT_CLINIC_OR_DEPARTMENT_OTHER): Payer: Self-pay | Admitting: General Surgery

## 2024-11-15 ENCOUNTER — Ambulatory Visit (INDEPENDENT_AMBULATORY_CARE_PROVIDER_SITE_OTHER)

## 2024-11-15 VITALS — BP 120/86 | HR 83 | Wt 179.8 lb

## 2024-11-15 DIAGNOSIS — Z170 Estrogen receptor positive status [ER+]: Secondary | ICD-10-CM

## 2024-11-15 DIAGNOSIS — C50412 Malignant neoplasm of upper-outer quadrant of left female breast: Secondary | ICD-10-CM | POA: Diagnosis not present

## 2024-11-15 DIAGNOSIS — Z719 Counseling, unspecified: Secondary | ICD-10-CM

## 2024-11-15 MED ORDER — IBUPROFEN 600 MG PO TABS: 600.0000 mg | ORAL_TABLET | Freq: Three times a day (TID) | ORAL | 0 refills | Status: AC | PRN

## 2024-11-15 MED ORDER — GABAPENTIN 300 MG PO CAPS: 300.0000 mg | ORAL_CAPSULE | Freq: Two times a day (BID) | ORAL | 0 refills | Status: DC

## 2024-11-15 MED ORDER — CYCLOBENZAPRINE HCL 5 MG PO TABS: 5.0000 mg | ORAL_TABLET | Freq: Three times a day (TID) | ORAL | 1 refills | Status: DC | PRN

## 2024-11-15 MED ORDER — OXYCODONE HCL 5 MG PO TABS: 5.0000 mg | ORAL_TABLET | ORAL | 0 refills | Status: AC | PRN

## 2024-11-15 MED ORDER — ACETAMINOPHEN 500 MG PO TABS: 500.0000 mg | ORAL_TABLET | Freq: Four times a day (QID) | ORAL | 0 refills | Status: AC | PRN

## 2024-11-15 MED ORDER — ONDANSETRON 4 MG PO TBDP: 4.0000 mg | ORAL_TABLET | Freq: Three times a day (TID) | ORAL | 0 refills | Status: AC | PRN

## 2024-11-15 NOTE — H&P (View-Only) (Signed)
 "    Patient ID: Kim Armstrong, female    DOB: 10-May-1976, 49 y.o.   MRN: [id number]  No chief complaint on file.   No diagnosis found.   History of Present Illness: Kim Armstrong is a 49 y.o.  female  with a history of breast cancer.  She presents for preoperative evaluation for upcoming procedure, bilateral mastectomies with immediate reconstruction, bilateral tissue expander placement with ADM/Mesh, possible adjacent tissue rearrangement, scheduled for 11/21/2024 with Dr. Ebbie and Myself.  The patient has not had problems with anesthesia.   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   oncologist--- dr odean;   dx 10/ 2015;   Stage IIA,  chemo 09-19-2014  to 11/ 2016 ;   01-29-2015 left breast lumpectomy w/ sln bx;   IMRT completed 04-27-2015   Menorrhagia    Migraine without aura and without status migrainosus, not intractable    neurologist--  jhonny ahle NP    Past Medical History: Allergies: Allergies[1]  Current Medications: Current Medications[2]  Past Surgical History: Past Surgical History:  Procedure Laterality Date   BREAST LUMPECTOMY WITH AXILLARY LYMPH NODE BIOPSY Left 01/29/2015   DILATATION & CURETTAGE/HYSTEROSCOPY WITH MYOSURE N/A 09/28/2022   Procedure: DILATATION & CURETTAGE/HYSTEROSCOPY WITH MYOSURE;  Surgeon: Rosalva Sawyer, MD;  Location: [facility];  Service: Gynecology;  Laterality: N/A;   DILITATION & CURRETTAGE/HYSTROSCOPY WITH HYDROTHERMAL ABLATION N/A 09/28/2022   Procedure: DILATATION & CURETTAGE/HYSTEROSCOPY WITH HYDROTHERMAL ABLATION;  Surgeon: Rosalva Sawyer, MD;  Location: [facility];  Service: Gynecology;  Laterality: N/A;   ECTOPIC PREGNANCY SURGERY  05/2009   @[facility] ;    laparoscopic turned laparotomy w/ right salpingectomy    Social History: Social History   Socioeconomic History   Marital status: Married    Spouse name: Not on file   Number of children: 1   Years of education: Not on file   Highest education level: Not on file  Occupational History   Not on file  Tobacco Use   Smoking status: Former    Current packs/day: 0.00    Average packs/day: 1 pack/day for 2.0 years (2.0 ttl pk-yrs)    Types: Cigarettes    Start date: 02/18/1993    Quit date: 02/19/1995    Years since quitting: 29.7   Smokeless tobacco: Never  Vaping Use   Vaping status: Never Used  Substance and Sexual Activity   Alcohol use: Yes    Comment: occasional   Drug use: Never   Sexual activity: Not on file  Other Topics Concern   Not on file  Social History Narrative   Not on file   Social Drivers of Health   Tobacco Use: Medium Risk (11/14/2024)   Patient History    Smoking Tobacco Use: Former    Smokeless Tobacco Use: Never    Passive Exposure: Not on file  Financial Resource Strain: Low Risk (01/18/2023)   Received from [facility]   Overall Financial Resource Strain (CARDIA)    Difficulty of Paying Living Expenses: Not hard at all  Food Insecurity: No Food Insecurity (01/18/2023)  Received from [facility]    Within the past 12 months, you worried that your food would run out before you got the money to buy more.: Never true    Within the past 12 months, the food you bought just didn't last and you didn't have money to get more.: Never true  Transportation Needs: No Transportation Needs (01/18/2023)   Received from [facility]   PRAPARE - Transportation    Lack of Transportation (Medical): No    Lack of Transportation (Non-Medical): No  Physical Activity: Sufficiently Active (01/18/2023)   Received from [facility]   Exercise Vital Sign    On average, how many days per week do you engage in moderate to strenuous exercise (like a brisk walk)?: 3 days    On average, how many minutes do you engage in exercise at this level?: 50 min  Stress: No Stress Concern Present (01/18/2023)   Received from [facility] - Occupational Stress Questionnaire    Feeling of Stress : Only a little  Social Connections: Socially Integrated (01/18/2023)   Received from [facility]   Social Network    How would you rate your social network (family, work, friends)?: Good participation with social networks  Intimate Partner Violence: Not At Risk (01/18/2023)   Received from [facility]   HITS    Over the last 12 months how often did your partner physically hurt you?: Never    Over the last 12 months how often did your partner insult you or talk down to you?: Never    Over the last 12 months how often did your partner threaten you with physical harm?: Never    Over the last 12 months how often did your partner scream or curse at you?: Never  Depression (PHQ2-9): Medium Risk (09/23/2024)   Depression (PHQ2-9)    PHQ-2 Score: 10  Alcohol Screen: Not on file  Housing: Unknown (09/12/2024)   Received from [facility]   Epic    Unable to Pay for Housing in the Last Year: Not on file    Number of Times Moved in the Last Year: Not on file    At any time in the past 12 months, were you homeless or living in a shelter (including now)?: No  Utilities: Not At Risk (01/18/2023)   Received from [facility] Utilities    Threatened with loss of utilities: No  Health Literacy: Not on file    Family History: Family History  Problem Relation Age of Onset   Breast cancer Mother 31       unilateral   Colon cancer Father 18   Colon polyps Neg Hx    Esophageal cancer Neg Hx    Stomach cancer Neg Hx     Review of Systems: ROS ROS negative except as noted  in HPI  Physical Exam: Vital Signs There were no vitals taken for this visit.  Physical Exam MA as chaperone Constitutional:      General: Not in acute distress.    Appearance: Normal appearance. Not ill-appearing.  HENT:     Head: Normocephalic and atraumatic.  Eyes:     Pupils: Pupils are equal, round. Cardiovascular:     Rate and Rhythm: Normal rate.    Pulses: Normal pulses.  Pulmonary:     Effort: No respiratory distress or increased work of breathing.  Speaks in full sentences. Abdominal:  General: Abdomen is flat. No distension.   Musculoskeletal: Normal range of motion. No lower extremity swelling or edema. No varicosities.  Skin:    General: Skin is warm and dry.     Findings: No erythema or rash.  Neurological:     Mental Status: Alert and oriented to person, place, and time.  Psychiatric:        Mood and Affect: Mood normal.        Behavior: Behavior normal.    Assessment/Plan: The patient is scheduled for , bilateral mastectomies with immediate reconstruction, bilateral tissue expander placement with ADM/Mesh, possible adjacent tissue rearrangement, with Dr. Ebbie and Myself.  Risks, benefits, and alternatives of procedure discussed, questions answered and consent obtained.    Smoking Status: Non smoker; Counseling Given? N/A Last Mammogram: 11/06; Results: See previous notes.   Caprini Score: 6; Risk Factors include: Age, BMI > 25, history of cancer and length of planned surgery. Recommendation for mechanical & pharmacological prophylaxis. Encourage early ambulation.   Pictures obtained: Yes  Post-op Rx sent to pharmacy: Tylenol , Motrin , Gabapentin , Oxycodone , Flexeril    Patient was provided with the General Surgical Risk consent document and Pain Medication Agreement prior to their appointment.  They had adequate time to read through the risk consent documents and Pain Medication Agreement. We also discussed them in person together during this preop  appointment. All of their questions were answered to their satisfaction.  Recommended calling if they have any further questions.  Risk consent form and Pain Medication Agreement to be scanned into patient's chart.  Photographic consent obtained.   Electronically signed by: Theola Cuellar M Yarel Rushlow, MD 11/15/2024 12:18 PM     [1] No Known Allergies [2]  Current Outpatient Medications:    calcium  carbonate (TUMS - DOSED IN MG ELEMENTAL CALCIUM ) 500 MG chewable tablet, Chew 1 tablet by mouth as needed for indigestion or heartburn., Disp: , Rfl:    EMGALITY 120 MG/ML SOAJ, , Disp: , Rfl:    ondansetron  (ZOFRAN  ODT) 4 MG disintegrating tablet, Take 1 tablet (4 mg total) by mouth every 8 (eight) hours as needed for nausea or vomiting., Disp: 20 tablet, Rfl: 0   rizatriptan (MAXALT) 10 MG tablet, Take 10 mg by mouth., Disp: , Rfl:    Ubrogepant (UBRELVY) 100 MG TABS, Take by mouth as needed., Disp: , Rfl:   "

## 2024-11-15 NOTE — Progress Notes (Signed)
 "    Patient ID: Kim Armstrong, female    DOB: 10-May-1976, 49 y.o.   MRN: [id number]  No chief complaint on file.   No diagnosis found.   History of Present Illness: Kim Armstrong is a 49 y.o.  female  with a history of breast cancer.  She presents for preoperative evaluation for upcoming procedure, bilateral mastectomies with immediate reconstruction, bilateral tissue expander placement with ADM/Mesh, possible adjacent tissue rearrangement, scheduled for 11/21/2024 with Dr. Ebbie and Myself.  The patient has not had problems with anesthesia.   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   oncologist--- dr odean;   dx 10/ 2015;   Stage IIA,  chemo 09-19-2014  to 11/ 2016 ;   01-29-2015 left breast lumpectomy w/ sln bx;   IMRT completed 04-27-2015   Menorrhagia    Migraine without aura and without status migrainosus, not intractable    neurologist--  jhonny ahle NP    Past Medical History: Allergies: Allergies[1]  Current Medications: Current Medications[2]  Past Surgical History: Past Surgical History:  Procedure Laterality Date   BREAST LUMPECTOMY WITH AXILLARY LYMPH NODE BIOPSY Left 01/29/2015   DILATATION & CURETTAGE/HYSTEROSCOPY WITH MYOSURE N/A 09/28/2022   Procedure: DILATATION & CURETTAGE/HYSTEROSCOPY WITH MYOSURE;  Surgeon: Rosalva Sawyer, MD;  Location: [facility];  Service: Gynecology;  Laterality: N/A;   DILITATION & CURRETTAGE/HYSTROSCOPY WITH HYDROTHERMAL ABLATION N/A 09/28/2022   Procedure: DILATATION & CURETTAGE/HYSTEROSCOPY WITH HYDROTHERMAL ABLATION;  Surgeon: Rosalva Sawyer, MD;  Location: [facility];  Service: Gynecology;  Laterality: N/A;   ECTOPIC PREGNANCY SURGERY  05/2009   @[facility] ;    laparoscopic turned laparotomy w/ right salpingectomy    Social History: Social History   Socioeconomic History   Marital status: Married    Spouse name: Not on file   Number of children: 1   Years of education: Not on file   Highest education level: Not on file  Occupational History   Not on file  Tobacco Use   Smoking status: Former    Current packs/day: 0.00    Average packs/day: 1 pack/day for 2.0 years (2.0 ttl pk-yrs)    Types: Cigarettes    Start date: 02/18/1993    Quit date: 02/19/1995    Years since quitting: 29.7   Smokeless tobacco: Never  Vaping Use   Vaping status: Never Used  Substance and Sexual Activity   Alcohol use: Yes    Comment: occasional   Drug use: Never   Sexual activity: Not on file  Other Topics Concern   Not on file  Social History Narrative   Not on file   Social Drivers of Health   Tobacco Use: Medium Risk (11/14/2024)   Patient History    Smoking Tobacco Use: Former    Smokeless Tobacco Use: Never    Passive Exposure: Not on file  Financial Resource Strain: Low Risk (01/18/2023)   Received from [facility]   Overall Financial Resource Strain (CARDIA)    Difficulty of Paying Living Expenses: Not hard at all  Food Insecurity: No Food Insecurity (01/18/2023)  Received from [facility]    Within the past 12 months, you worried that your food would run out before you got the money to buy more.: Never true    Within the past 12 months, the food you bought just didn't last and you didn't have money to get more.: Never true  Transportation Needs: No Transportation Needs (01/18/2023)   Received from [facility]   PRAPARE - Transportation    Lack of Transportation (Medical): No    Lack of Transportation (Non-Medical): No  Physical Activity: Sufficiently Active (01/18/2023)   Received from [facility]   Exercise Vital Sign    On average, how many days per week do you engage in moderate to strenuous exercise (like a brisk walk)?: 3 days    On average, how many minutes do you engage in exercise at this level?: 50 min  Stress: No Stress Concern Present (01/18/2023)   Received from [facility] - Occupational Stress Questionnaire    Feeling of Stress : Only a little  Social Connections: Socially Integrated (01/18/2023)   Received from [facility]   Social Network    How would you rate your social network (family, work, friends)?: Good participation with social networks  Intimate Partner Violence: Not At Risk (01/18/2023)   Received from [facility]   HITS    Over the last 12 months how often did your partner physically hurt you?: Never    Over the last 12 months how often did your partner insult you or talk down to you?: Never    Over the last 12 months how often did your partner threaten you with physical harm?: Never    Over the last 12 months how often did your partner scream or curse at you?: Never  Depression (PHQ2-9): Medium Risk (09/23/2024)   Depression (PHQ2-9)    PHQ-2 Score: 10  Alcohol Screen: Not on file  Housing: Unknown (09/12/2024)   Received from [facility]   Epic    Unable to Pay for Housing in the Last Year: Not on file    Number of Times Moved in the Last Year: Not on file    At any time in the past 12 months, were you homeless or living in a shelter (including now)?: No  Utilities: Not At Risk (01/18/2023)   Received from [facility] Utilities    Threatened with loss of utilities: No  Health Literacy: Not on file    Family History: Family History  Problem Relation Age of Onset   Breast cancer Mother 31       unilateral   Colon cancer Father 18   Colon polyps Neg Hx    Esophageal cancer Neg Hx    Stomach cancer Neg Hx     Review of Systems: ROS ROS negative except as noted  in HPI  Physical Exam: Vital Signs There were no vitals taken for this visit.  Physical Exam MA as chaperone Constitutional:      General: Not in acute distress.    Appearance: Normal appearance. Not ill-appearing.  HENT:     Head: Normocephalic and atraumatic.  Eyes:     Pupils: Pupils are equal, round. Cardiovascular:     Rate and Rhythm: Normal rate.    Pulses: Normal pulses.  Pulmonary:     Effort: No respiratory distress or increased work of breathing.  Speaks in full sentences. Abdominal:  General: Abdomen is flat. No distension.   Musculoskeletal: Normal range of motion. No lower extremity swelling or edema. No varicosities.  Skin:    General: Skin is warm and dry.     Findings: No erythema or rash.  Neurological:     Mental Status: Alert and oriented to person, place, and time.  Psychiatric:        Mood and Affect: Mood normal.        Behavior: Behavior normal.    Assessment/Plan: The patient is scheduled for , bilateral mastectomies with immediate reconstruction, bilateral tissue expander placement with ADM/Mesh, possible adjacent tissue rearrangement, with Dr. Ebbie and Myself.  Risks, benefits, and alternatives of procedure discussed, questions answered and consent obtained.    Smoking Status: Non smoker; Counseling Given? N/A Last Mammogram: 11/06; Results: See previous notes.   Caprini Score: 6; Risk Factors include: Age, BMI > 25, history of cancer and length of planned surgery. Recommendation for mechanical & pharmacological prophylaxis. Encourage early ambulation.   Pictures obtained: Yes  Post-op Rx sent to pharmacy: Tylenol , Motrin , Gabapentin , Oxycodone , Flexeril    Patient was provided with the General Surgical Risk consent document and Pain Medication Agreement prior to their appointment.  They had adequate time to read through the risk consent documents and Pain Medication Agreement. We also discussed them in person together during this preop  appointment. All of their questions were answered to their satisfaction.  Recommended calling if they have any further questions.  Risk consent form and Pain Medication Agreement to be scanned into patient's chart.  Photographic consent obtained.   Electronically signed by: Theola Cuellar M Yarel Rushlow, MD 11/15/2024 12:18 PM     [1] No Known Allergies [2]  Current Outpatient Medications:    calcium  carbonate (TUMS - DOSED IN MG ELEMENTAL CALCIUM ) 500 MG chewable tablet, Chew 1 tablet by mouth as needed for indigestion or heartburn., Disp: , Rfl:    EMGALITY 120 MG/ML SOAJ, , Disp: , Rfl:    ondansetron  (ZOFRAN  ODT) 4 MG disintegrating tablet, Take 1 tablet (4 mg total) by mouth every 8 (eight) hours as needed for nausea or vomiting., Disp: 20 tablet, Rfl: 0   rizatriptan (MAXALT) 10 MG tablet, Take 10 mg by mouth., Disp: , Rfl:    Ubrogepant (UBRELVY) 100 MG TABS, Take by mouth as needed., Disp: , Rfl:   "

## 2024-11-18 ENCOUNTER — Ambulatory Visit: Payer: Self-pay | Admitting: Genetic Counselor

## 2024-11-18 DIAGNOSIS — Z1379 Encounter for other screening for genetic and chromosomal anomalies: Secondary | ICD-10-CM

## 2024-11-18 MED ORDER — ENSURE PRE-SURGERY PO LIQD: 296.0000 mL | Freq: Once | ORAL | Status: DC

## 2024-11-18 MED ORDER — CHLORHEXIDINE GLUCONATE CLOTH 2 % EX PADS: 6.0000 | MEDICATED_PAD | Freq: Once | CUTANEOUS | Status: DC

## 2024-11-18 NOTE — Progress Notes (Signed)
 HPI:  Ms. Phung was previously seen in the [facility] due to a personal and family history of cancer and concerns regarding a hereditary predisposition to cancer. Please refer to our prior cancer genetics clinic note for more information regarding our discussion, assessment and recommendations, at the time. Ms. Ki recent genetic test results were disclosed to her, as were recommendations warranted by these results. These results and recommendations are discussed in more detail below.  Results were disclosed by telephone on 11/18/24.   CANCER HISTORY:  Oncology History  Breast cancer of upper-outer quadrant of left female breast (HCC)  08/20/2014 Initial Biopsy   Left breast bx: Invasive ductal carcinoma grade 3, ER+ (100%), PR+ (89%) HER-2 positive ratio 3.5, Ki-67 25%; biopsy of satellite lesion fibroadenoma   08/26/2014 Breast MRI   Left breast: Upper-outer quadrant 1.4 cm from nipple 1.9 x 2.5 x 2.6 cm enhancing mass with the 8 mm nodule 1 cm posterior Right breast: 1.4 x 1.6 cm mass upper outer quadrant biopsy proven fibroadenoma   08/26/2014 Clinical Stage   Stage IIA: T2 N0   09/19/2014 -  Neo-Adjuvant Chemotherapy   Neoadjuvant chemotherapy with Taxotere , carboplatin , Herceptin  and Perjeta  x6 cycles followed by Herceptin  maintenance to be complete November 2016   09/26/2014 Procedure   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 Otherwise negative at ATM, BARD1, BRCA1, BRCA2, BRIP1, CDH1, CHEK2, EPCAM, MLH1, MRE11A, MSH2, MSH6, MUTYH, NBN, NF1, PALB2, PMS2, PTEN, RAD50, RAD51C, RAD51D, SMARCA4, STK11, and TP53.   01/09/2015 Breast MRI   Interval marked response to chemotherapy with significant decrease in the size of the tumor, satellite nodule is now vaguely visible, 2.6 cm mass is now 0.9 cm   01/29/2015 Definitive Surgery   Left breast lumpectomy/SLNB by Dr. Iva at [facility]; 3.2 cm area of microscopic IDC cells, 2 sentinel  lymph nodes negative (0/2). 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   Left breast boost/ 16 Gy at 2 Gy per fraction x 8 fractions   06/05/2015 -  Anti-estrogen oral therapy   Tamoxifen  20 mg daily. Planned duration of treatment 5-10 years.   11/03/2015 Survivorship   Survivorship care visit completed and copy of care plan provided to patient.   09/12/2024 Relapse/Recurrence   Screening mammogram detected right breast calcifications:biopsy: DCIS with 2 foci of microinvasion less than 1 mm, intermediate grade, ER 100%, PR 10% Retroareolar left breast: 1 cm mass, biopsy: Grade 3 IDC, no LVI, ER 70%, PR 15%, Ki67 10%, HER2 2+ by IHC, FISH Pending   11/17/2024 Genetic Testing   Negative CancerNext +RNAinsight through [employer] Report date 11/17/24.    The [facility] CancerNext+RNAinsight Panel includes sequencing, rearrangement analysis, and RNA analysis for the following 40 genes: APC, ATM, BAP1, BARD1, BMPR1A, BRCA1, BRCA2, BRIP1, CDH1, CDKN2A, CHEK2, FH, FLCN, MET, MLH1, MSH2, MSH6, MUTYH, NF1, NTHL1, PALB2, PMS2, PTEN, RAD51C, RAD51D, RPS20, SMAD4, STK11, TP53, TSC1, TSC2 and VHL (sequencing and deletion/duplication); AXIN2, HOXB13, MBD4, MSH3, POLD1 and POLE (sequencing only); EPCAM and GREM1 (deletion/duplication only).    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 Variants in RAD50 are not currently thought to be associated with an increased risk for cancer. The RAD50 gene is no longer included on core panels at [facility].      FAMILY HISTORY:  We obtained a detailed, 4-generation family history.  Significant diagnoses are listed below: Family History  Problem Relation Age of Onset   Breast cancer Mother 21       unilateral   Colon cancer Father 20   Colon polyps Neg Hx     Esophageal cancer Neg Hx    Stomach cancer Neg Hx     Ms. Copado is unaware of previous family history of genetic testing for hereditary cancer risks. There is no reported Ashkenazi Jewish ancestry.      GENETIC TEST RESULTS: Genetic testing reported out on 11/17/24 through the CancerNext +RNA panel found no pathogenic mutations. The [facility] CancerNext+RNAinsight Panel includes sequencing, rearrangement analysis, and RNA analysis for the following 40 genes: APC, ATM, BAP1, BARD1, BMPR1A, BRCA1, BRCA2, BRIP1, CDH1, CDKN2A, CHEK2, FH, FLCN, MET, MLH1, MSH2, MSH6, MUTYH, NF1, NTHL1, PALB2, PMS2, PTEN, RAD51C, RAD51D, RPS20, SMAD4, STK11, TP53, TSC1, TSC2 and VHL (sequencing and deletion/duplication); AXIN2, HOXB13, MBD4, MSH3, POLD1 and POLE (sequencing only); EPCAM and GREM1 (deletion/duplication only). The test report has been scanned into EPIC and is located under the Molecular Pathology section of the Results Review tab.  A portion of the result report is included below for reference.     We discussed with Ms. Englert that because current genetic testing is not perfect, it is possible there may be a gene mutation in one of these genes that current testing cannot detect, but that chance is small.  We also discussed, that there could be another gene that has not yet been discovered, or that we have not yet tested, that is responsible for the cancer diagnoses in the family. It is also possible there is a hereditary cause for the cancer in the family that Ms. Dome did not inherit and therefore was not identified in her testing.  Therefore, it is important to remain in touch with cancer genetics in the future so that we can continue to offer Ms. Pellman the most up to date genetic testing.   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 Variants in RAD50 are not currently thought to be associated with an increased risk for cancer. The RAD50 gene is no  longer included on core panels at [facility].   ADDITIONAL GENETIC TESTING: We discussed with Ms. Suttles that there are other genes that are associated with increased cancer risk that can be analyzed. Should Ms. Cerveny wish to pursue additional genetic testing, we are happy to discuss and coordinate this testing, at any time.    CANCER SCREENING RECOMMENDATIONS: Ms. Ryle test result is considered negative (normal).  This means that we have not identified a hereditary cause for her personal and family history of cancer at this time. Most cancers happen by chance and this negative test suggests that her personal and family history of cancer may fall into this category.    Possible reasons for Ms. Tackett's negative genetic test include:  1. There may be a gene mutation in one of these genes that current testing methods cannot detect. The likelihood of this is low, but possible.   2. There could be another gene that has not yet been discovered, or that we have not yet tested, that is responsible for the cancer diagnoses in the family.  3.  There may be no hereditary risk for cancer in the family. The cancers in Ms. Taborn and/or her family may be sporadic/familial or  due to other genetic and environmental factors. 4. It is also possible there is a hereditary cause for the cancer in the family that Ms. Mcgreal did not inherit.  Therefore, it is recommended she continue to follow the cancer management and screening guidelines provided by her oncology and primary healthcare provider. An individual's cancer risk and medical management are not determined by genetic test results alone. Overall cancer risk assessment incorporates additional factors, including personal medical history, family history, and any available genetic information that may result in a personalized plan for cancer prevention and surveillance  Given Ms. Zambito's personal and family histories, we must interpret these  negative results with some caution.  Families with features suggestive of hereditary risk for cancer tend to have multiple family members with cancer, diagnoses in multiple generations and diagnoses before the age of 65. Ms. Blacksher family exhibits some of these features. Thus, this result may simply reflect our current inability to detect all mutations within these genes or there may be a different gene that has not yet been discovered or tested.   Based on Ms. Salois's family history of a first degree relative with colorectal cancer, the National Comprehensive Cancer Network: Colorectal Cancer Screening v2.2025 recommends that colonoscopies begin at age 69 (or 10 years prior to the age at diagnosis of colorectal cancer in their close relative) and are repeated at least every 5 years, or more often as needed.   RECOMMENDATIONS FOR FAMILY MEMBERS:  Individuals in this family might be at some increased risk of developing cancer, over the general population risk, simply due to the family history of cancer.  We recommended individuals assigned female at birth in this family have a yearly mammogram beginning at age 14, or 40 years younger than the earliest onset of cancer, an annual clinical breast exam, and perform monthly breast self-exams. Individuals assigned female at birth in this family should also have a gynecological exam as recommended by their primary provider. All family members should be referred for colonoscopy starting at age 31, or 7 years younger than the earliest onset of cancer.  Other relatives may benefit from completing their own genetic testing, especially if they have been diagnosed with cancer.   FOLLOW-UP: Lastly, we discussed with Ms. Callens that cancer genetics is a rapidly advancing field and it is possible that new genetic tests will be appropriate for her and/or her family members in the future. We encouraged her to remain in contact with cancer genetics on an annual basis  so we can update her personal and family histories and let her know of advances in cancer genetics that may benefit this family.   Our contact number was provided. Ms. Capili questions were answered to her satisfaction, and she knows she is welcome to call us  at anytime with additional questions or concerns.   Burnard Ogren, MS, [facility] Licensed, [email or web address] [phone]

## 2024-11-18 NOTE — Progress Notes (Signed)

## 2024-11-19 ENCOUNTER — Inpatient Hospital Stay: Admitting: Licensed Clinical Social Worker

## 2024-11-19 ENCOUNTER — Encounter: Payer: Self-pay | Admitting: Radiology

## 2024-11-19 NOTE — Progress Notes (Signed)
 [facility] CSW Counseling Note  Patient was referred by nurse navigator. Treatment type: Individual  Presenting Concerns: Patient and/or family reports the following symptoms/concerns: anxiety and stress Duration of problem: since dx October 2025; Severity of problem: moderate   Orientation:oriented to person, place, time/date, and situation.   Affect: Appropriate and Congruent Risk of harm to self or others: No plan to harm self or others  Patient and/or Family's Strengths/Protective Factors: Social and Emotional competence and Concrete supports in place (healthy food, safe environments, etc.)Ability for insight  Capable of independent living  Communication skills  General fund of knowledge  Motivation for treatment/growth  Special hobby/interest  Supportive family/friends      Goals Addressed: Patient will:  Reduce symptoms of: anxiety and stress Increase knowledge and/or ability of: coping skills  Increase healthy adjustment to current life circumstances   Progress towards Goals: Met   Interventions: Interventions utilized:  CBT Cognitive Behavioral Therapy and Sleep Hygiene  GAD 7 PHQ 9     11/19/2024    8:55 AM 09/23/2024    9:49 AM 04/06/2023    8:49 AM  PHQ9 SCORE ONLY  PHQ-9 Total Score 3 10 1       Data saved with a previous flowsheet row definition       11/19/2024    8:54 AM 09/23/2024    9:48 AM 04/06/2023    8:49 AM 01/03/2023   10:17 AM  GAD 7 : Generalized Anxiety Score  Nervous, Anxious, on Edge 3 3 1  0  Control/stop worrying 0 3 0 0  Worry too much - different things 0 2 0 1  Trouble relaxing 2 2 0 0  Restless 2 0 0 1  Easily annoyed or irritable 0 3 0 0  Afraid - awful might happen 0 3 0 0  Total GAD 7 Score 7 16 1 2   Anxiety Difficulty Not difficult at all Very difficult Somewhat difficult Not difficult at all       Assessment: Patient reports continued improvement in anxiety. She has a surgery date for 11/21/24 which has helped significantly.  She will also have a partial nephrectomy in February due to kidney mass and is finding a balance of thinking about that and planning ahead with focusing on what she needs to do in the moment for her next step.  Pt is able to be more grounded in the moment currently. She is experiencing difficulty falling &staying asleep. Discussed sleep hygiene today as well as how to continue utilizing the tools that already work for her.    Plan: Follow up with CSW: PRN- pt will call as needed due to upcoming surgeries Behavioral recommendations: Continue box breath & color naming. Ensure good sleep hygiene as discussed today  Referral(s):        Arabelle Bollig E Shakeena Kafer, LCSW

## 2024-11-19 NOTE — Progress Notes (Signed)
 Karalee Wilkie POUR, MD  Daralene Ferol FALCON, RT Approved for CT BX RIGHT LOWER POLE RENAL MASS.   Requested by Noretta Ferrara.  Moderate sedation.  HKM       Previous Messages    ----- Message ----- From: Daralene Ferol FALCON, RT Sent: 11/13/2024  11:53 AM EST To: [facility] Procedure Requests Subject: US  BIOPSY (KIDNEY)                            Procedure :US  BIOPSY (KIDNEY)  Reason :right renal mass Dx: Renal mass [N28.89 (ICD-10-CM)]  History :CT CHEST ABDOMEN PELVIS W CONTRAST (Accession [id number]) (Order [id number]), NM Bone Scan Whole Body (Accession [id number]) (Order [id number])  Provider:Borden, Gretel, MD  Provider contact ; [phone]

## 2024-11-21 ENCOUNTER — Other Ambulatory Visit: Payer: Self-pay | Admitting: Urology

## 2024-11-21 ENCOUNTER — Encounter (HOSPITAL_BASED_OUTPATIENT_CLINIC_OR_DEPARTMENT_OTHER): Payer: Self-pay | Admitting: General Surgery

## 2024-11-21 ENCOUNTER — Encounter (HOSPITAL_BASED_OUTPATIENT_CLINIC_OR_DEPARTMENT_OTHER): Admission: RE | Disposition: A | Payer: Self-pay | Source: Home / Self Care | Attending: General Surgery

## 2024-11-21 ENCOUNTER — Observation Stay (HOSPITAL_COMMUNITY): Admission: RE | Admit: 2024-11-21 | Discharge: 2024-11-22 | Disposition: A | Discharge status: Home / Self Care

## 2024-11-21 ENCOUNTER — Encounter (HOSPITAL_BASED_OUTPATIENT_CLINIC_OR_DEPARTMENT_OTHER): Payer: Self-pay | Admitting: Anesthesiology

## 2024-11-21 ENCOUNTER — Ambulatory Visit (HOSPITAL_BASED_OUTPATIENT_CLINIC_OR_DEPARTMENT_OTHER): Payer: Self-pay | Admitting: Anesthesiology

## 2024-11-21 DIAGNOSIS — Z87891 Personal history of nicotine dependence: Secondary | ICD-10-CM | POA: Diagnosis not present

## 2024-11-21 DIAGNOSIS — C50919 Malignant neoplasm of unspecified site of unspecified female breast: Secondary | ICD-10-CM | POA: Diagnosis present

## 2024-11-21 DIAGNOSIS — Z01818 Encounter for other preprocedural examination: Principal | ICD-10-CM

## 2024-11-21 DIAGNOSIS — C50911 Malignant neoplasm of unspecified site of right female breast: Secondary | ICD-10-CM | POA: Diagnosis not present

## 2024-11-21 DIAGNOSIS — Z421 Encounter for breast reconstruction following mastectomy: Secondary | ICD-10-CM | POA: Diagnosis not present

## 2024-11-21 DIAGNOSIS — Z170 Estrogen receptor positive status [ER+]: Secondary | ICD-10-CM | POA: Insufficient documentation

## 2024-11-21 DIAGNOSIS — C50912 Malignant neoplasm of unspecified site of left female breast: Secondary | ICD-10-CM | POA: Diagnosis present

## 2024-11-21 HISTORY — PX: MASTECTOMY W/ SENTINEL NODE BIOPSY: SHX2001

## 2024-11-21 HISTORY — PX: ADJACENT TISSUE TRANSFER/TISSUE REARRANGEMENT: SHX6829

## 2024-11-21 HISTORY — PX: TISSUE EXPANDER PLACEMENT: SHX2530

## 2024-11-21 LAB — POCT PREGNANCY, URINE: Preg Test, Ur: NEGATIVE

## 2024-11-21 MED ORDER — OXYCODONE HCL 5 MG PO TABS: 5.0000 mg | ORAL_TABLET | ORAL | Status: DC | PRN

## 2024-11-21 MED ORDER — MAGTRACE LYMPHATIC TRACER
INTRAMUSCULAR | Status: DC | PRN
  Administered 2024-11-21 (×2): 2 mL via INTRAMUSCULAR

## 2024-11-21 MED ORDER — SODIUM CHLORIDE (PF) 0.9 % IJ SOLN
INTRAMUSCULAR | Status: AC
  Filled 2024-11-21: qty 10

## 2024-11-21 MED ORDER — NITROGLYCERIN 2 % TD OINT
TOPICAL_OINTMENT | TRANSDERMAL | Status: AC
  Filled 2024-11-21: qty 30

## 2024-11-21 MED ORDER — BUPIVACAINE-EPINEPHRINE (PF) 0.5% -1:200000 IJ SOLN
INTRAMUSCULAR | Status: DC | PRN
  Administered 2024-11-21 (×2): 20 mL via PERINEURAL

## 2024-11-21 MED ORDER — POVIDONE-IODINE 10 % EX SOLN
CUTANEOUS | Status: DC | PRN
  Administered 2024-11-21: 1 via TOPICAL

## 2024-11-21 MED ORDER — CEFAZOLIN SODIUM-DEXTROSE 2-4 GM/100ML-% IV SOLN
INTRAVENOUS | Status: AC
  Filled 2024-11-21: qty 100

## 2024-11-21 MED ORDER — PHENYLEPHRINE HCL (PRESSORS) 10 MG/ML IV SOLN
INTRAVENOUS | Status: DC | PRN
  Administered 2024-11-21: 40 ug via INTRAVENOUS

## 2024-11-21 MED ORDER — NITROGLYCERIN 2 % TD OINT
TOPICAL_OINTMENT | TRANSDERMAL | Status: DC | PRN
  Administered 2024-11-21: .5 [in_us] via TOPICAL

## 2024-11-21 MED ORDER — SCOPOLAMINE 1 MG/3DAYS TD PT72
MEDICATED_PATCH | TRANSDERMAL | Status: AC
  Filled 2024-11-21: qty 1

## 2024-11-21 MED ORDER — ACETAMINOPHEN 500 MG PO TABS
1000.0000 mg | ORAL_TABLET | ORAL | Status: AC
  Administered 2024-11-21: 1000 mg via ORAL

## 2024-11-21 MED ORDER — IBUPROFEN 600 MG PO TABS
600.0000 mg | ORAL_TABLET | Freq: Four times a day (QID) | ORAL | Status: DC | PRN
  Administered 2024-11-21 – 2024-11-22 (×2): 600 mg via ORAL
  Filled 2024-11-21 (×2): qty 1

## 2024-11-21 MED ORDER — DIPHENHYDRAMINE HCL 50 MG/ML IJ SOLN
INTRAMUSCULAR | Status: AC
  Filled 2024-11-21: qty 1

## 2024-11-21 MED ORDER — DEXAMETHASONE SODIUM PHOSPHATE 4 MG/ML IJ SOLN
INTRAMUSCULAR | Status: DC | PRN
  Administered 2024-11-21: 5 mg via INTRAVENOUS

## 2024-11-21 MED ORDER — PROCHLORPERAZINE EDISYLATE 10 MG/2ML IJ SOLN: 5.0000 mg | Freq: Four times a day (QID) | INTRAMUSCULAR | Status: DC | PRN

## 2024-11-21 MED ORDER — PROPOFOL 500 MG/50ML IV EMUL
INTRAVENOUS | Status: DC | PRN
  Administered 2024-11-21: 150 ug/kg/min via INTRAVENOUS

## 2024-11-21 MED ORDER — ONDANSETRON HCL 4 MG/2ML IJ SOLN: 4.0000 mg | Freq: Four times a day (QID) | INTRAMUSCULAR | Status: DC | PRN

## 2024-11-21 MED ORDER — LACTATED RINGERS IV SOLN: INTRAVENOUS | Status: AC

## 2024-11-21 MED ORDER — ONDANSETRON 4 MG PO TBDP: 4.0000 mg | ORAL_TABLET | Freq: Four times a day (QID) | ORAL | Status: DC | PRN

## 2024-11-21 MED ORDER — LIDOCAINE HCL (CARDIAC) PF 100 MG/5ML IV SOSY
PREFILLED_SYRINGE | INTRAVENOUS | Status: DC | PRN
  Administered 2024-11-21: 20 mg via INTRAVENOUS

## 2024-11-21 MED ORDER — HYDROMORPHONE HCL 1 MG/ML IJ SOLN: 0.5000 mg | INTRAMUSCULAR | Status: DC | PRN

## 2024-11-21 MED ORDER — SCOPOLAMINE 1 MG/3DAYS TD PT72
MEDICATED_PATCH | TRANSDERMAL | Status: DC | PRN
  Administered 2024-11-21: 1 via TRANSDERMAL

## 2024-11-21 MED ORDER — PROPOFOL 10 MG/ML IV BOLUS
INTRAVENOUS | Status: DC | PRN
  Administered 2024-11-21: 150 mg via INTRAVENOUS

## 2024-11-21 MED ORDER — DIPHENHYDRAMINE HCL 50 MG/ML IJ SOLN
INTRAMUSCULAR | Status: DC | PRN
  Administered 2024-11-21: 6.25 mg via INTRAVENOUS

## 2024-11-21 MED ORDER — DEXAMETHASONE SOD PHOSPHATE PF 10 MG/ML IJ SOLN
INTRAMUSCULAR | Status: AC
  Filled 2024-11-21: qty 1

## 2024-11-21 MED ORDER — SUCCINYLCHOLINE CHLORIDE 200 MG/10ML IV SOSY
PREFILLED_SYRINGE | INTRAVENOUS | Status: AC
  Filled 2024-11-21: qty 10

## 2024-11-21 MED ORDER — BUPIVACAINE LIPOSOME 1.3 % IJ SUSP
INTRAMUSCULAR | Status: DC | PRN
  Administered 2024-11-21 (×2): 5 mL via PERINEURAL

## 2024-11-21 MED ORDER — CEFAZOLIN SODIUM-DEXTROSE 2-4 GM/100ML-% IV SOLN
2.0000 g | Freq: Three times a day (TID) | INTRAVENOUS | Status: DC
  Administered 2024-11-21 – 2024-11-22 (×2): 2 g via INTRAVENOUS
  Filled 2024-11-21 (×2): qty 100

## 2024-11-21 MED ORDER — ROCURONIUM BROMIDE 10 MG/ML (PF) SYRINGE
PREFILLED_SYRINGE | INTRAVENOUS | Status: AC
  Filled 2024-11-21: qty 10

## 2024-11-21 MED ORDER — MIDAZOLAM HCL 5 MG/5ML IJ SOLN
INTRAMUSCULAR | Status: DC | PRN
  Administered 2024-11-21: 2 mg via INTRAVENOUS

## 2024-11-21 MED ORDER — FENTANYL CITRATE (PF) 100 MCG/2ML IJ SOLN
INTRAMUSCULAR | Status: AC
  Filled 2024-11-21: qty 2

## 2024-11-21 MED ORDER — FENTANYL CITRATE (PF) 100 MCG/2ML IJ SOLN
INTRAMUSCULAR | Status: DC | PRN
  Administered 2024-11-21: 100 ug via INTRAVENOUS
  Administered 2024-11-21 (×2): 50 ug via INTRAVENOUS

## 2024-11-21 MED ORDER — CEFAZOLIN SODIUM-DEXTROSE 2-4 GM/100ML-% IV SOLN
2.0000 g | INTRAVENOUS | Status: AC
  Administered 2024-11-21 (×2): 2 g via INTRAVENOUS

## 2024-11-21 MED ORDER — BACITRACIN ZINC 500 UNIT/GM EX OINT
TOPICAL_OINTMENT | CUTANEOUS | Status: AC
  Filled 2024-11-21: qty 28.35

## 2024-11-21 MED ORDER — LACTATED RINGERS IV SOLN: INTRAVENOUS | Status: DC

## 2024-11-21 MED ORDER — EPHEDRINE 5 MG/ML INJ
INTRAVENOUS | Status: AC
  Filled 2024-11-21: qty 5

## 2024-11-21 MED ORDER — METHOCARBAMOL 500 MG PO TABS: 500.0000 mg | ORAL_TABLET | Freq: Three times a day (TID) | ORAL | Status: DC | PRN

## 2024-11-21 MED ORDER — DEXMEDETOMIDINE HCL IN NACL 80 MCG/20ML IV SOLN
INTRAVENOUS | Status: DC | PRN
  Administered 2024-11-21: 8 ug via INTRAVENOUS

## 2024-11-21 MED ORDER — HYDROMORPHONE HCL 1 MG/ML IJ SOLN: 0.2500 mg | INTRAMUSCULAR | Status: DC | PRN

## 2024-11-21 MED ORDER — VASHE WOUND IRRIGATION OPTIME
TOPICAL | Status: DC | PRN
  Administered 2024-11-21 (×2): 34 [oz_av]

## 2024-11-21 MED ORDER — ACETAMINOPHEN 500 MG PO TABS
ORAL_TABLET | ORAL | Status: AC
  Filled 2024-11-21: qty 2

## 2024-11-21 MED ORDER — HYDROGEN PEROXIDE 3 % EX SOLN
CUTANEOUS | Status: DC | PRN
  Administered 2024-11-21: 1 via TOPICAL

## 2024-11-21 MED ORDER — DEXMEDETOMIDINE HCL IN NACL 80 MCG/20ML IV SOLN
INTRAVENOUS | Status: AC
  Filled 2024-11-21: qty 20

## 2024-11-21 MED ORDER — SUGAMMADEX SODIUM 200 MG/2ML IV SOLN
INTRAVENOUS | Status: DC | PRN
  Administered 2024-11-21: 200 mg via INTRAVENOUS

## 2024-11-21 MED ORDER — METHOCARBAMOL 1000 MG/10ML IJ SOLN: 500.0000 mg | Freq: Three times a day (TID) | INTRAMUSCULAR | Status: DC | PRN

## 2024-11-21 MED ORDER — BACITRACIN ZINC 500 UNIT/GM EX OINT
TOPICAL_OINTMENT | CUTANEOUS | Status: DC | PRN
  Administered 2024-11-21: 1 via TOPICAL

## 2024-11-21 MED ORDER — ACETAMINOPHEN 500 MG PO TABS
1000.0000 mg | ORAL_TABLET | Freq: Four times a day (QID) | ORAL | Status: DC
  Administered 2024-11-21 – 2024-11-22 (×2): 1000 mg via ORAL
  Filled 2024-11-21 (×2): qty 2

## 2024-11-21 MED ORDER — ATROPINE SULFATE (PF) 0.4 MG/ML IJ SOLN
INTRAMUSCULAR | Status: AC
  Filled 2024-11-21: qty 1

## 2024-11-21 MED ORDER — TRANEXAMIC ACID 1000 MG/10ML IV SOLN
INTRAVENOUS | Status: AC
  Filled 2024-11-21: qty 60

## 2024-11-21 MED ORDER — SODIUM CHLORIDE (PF) 0.9 % IJ SOLN
INTRAMUSCULAR | Status: DC | PRN
  Administered 2024-11-21: 250 mL

## 2024-11-21 MED ORDER — PROCHLORPERAZINE MALEATE 10 MG PO TABS
10.0000 mg | ORAL_TABLET | Freq: Four times a day (QID) | ORAL | Status: DC | PRN
  Filled 2024-11-21: qty 1

## 2024-11-21 MED ORDER — FENTANYL CITRATE (PF) 100 MCG/2ML IJ SOLN
100.0000 ug | Freq: Once | INTRAMUSCULAR | Status: AC
  Administered 2024-11-21: 100 ug via INTRAVENOUS

## 2024-11-21 MED ORDER — MIDAZOLAM HCL (PF) 2 MG/2ML IJ SOLN
2.0000 mg | Freq: Once | INTRAMUSCULAR | Status: AC
  Administered 2024-11-21: 2 mg via INTRAVENOUS

## 2024-11-21 MED ORDER — DOCUSATE SODIUM 100 MG PO CAPS
100.0000 mg | ORAL_CAPSULE | Freq: Two times a day (BID) | ORAL | Status: DC
  Administered 2024-11-21: 100 mg via ORAL
  Filled 2024-11-21: qty 1

## 2024-11-21 MED ORDER — ROCURONIUM BROMIDE 100 MG/10ML IV SOLN
INTRAVENOUS | Status: DC | PRN
  Administered 2024-11-21: 10 mg via INTRAVENOUS
  Administered 2024-11-21: 20 mg via INTRAVENOUS
  Administered 2024-11-21 (×2): 30 mg via INTRAVENOUS
  Administered 2024-11-21: 70 mg via INTRAVENOUS

## 2024-11-21 MED ORDER — EPHEDRINE SULFATE (PRESSORS) 25 MG/5ML IV SOSY
PREFILLED_SYRINGE | INTRAVENOUS | Status: DC | PRN
  Administered 2024-11-21: 10 mg via INTRAVENOUS

## 2024-11-21 MED ORDER — 0.9 % SODIUM CHLORIDE (POUR BTL) OPTIME
TOPICAL | Status: DC | PRN
  Administered 2024-11-21 (×2): 1000 mL

## 2024-11-21 MED ORDER — MIDAZOLAM HCL 2 MG/2ML IJ SOLN
INTRAMUSCULAR | Status: AC
  Filled 2024-11-21: qty 2

## 2024-11-21 MED ORDER — PHENYLEPHRINE 80 MCG/ML (10ML) SYRINGE FOR IV PUSH (FOR BLOOD PRESSURE SUPPORT)
PREFILLED_SYRINGE | INTRAVENOUS | Status: AC
  Filled 2024-11-21: qty 10

## 2024-11-21 MED ORDER — ALBUMIN HUMAN 5 % IV SOLN: INTRAVENOUS | Status: DC | PRN

## 2024-11-21 MED ORDER — TRANEXAMIC ACID 1000 MG/10ML IV SOLN
Status: DC | PRN
  Administered 2024-11-21 (×2): 3000 mg via TOPICAL

## 2024-11-21 MED ORDER — ONDANSETRON HCL 4 MG/2ML IJ SOLN
INTRAMUSCULAR | Status: AC
  Filled 2024-11-21: qty 2

## 2024-11-21 MED ORDER — LIDOCAINE 2% (20 MG/ML) 5 ML SYRINGE
INTRAMUSCULAR | Status: AC
  Filled 2024-11-21: qty 5

## 2024-11-21 NOTE — Interval H&P Note (Signed)
 History and Physical Interval Note:  11/21/2024 7:17 AM  Kim Armstrong  has presented today for surgery, with the diagnosis of BILATERAL BREAST CANCER.  The various methods of treatment have been discussed with the patient and family. After consideration of risks, benefits and other options for treatment, the patient has consented to  Procedures with comments: MASTECTOMY WITH SENTINEL LYMPH NODE BIOPSY (Bilateral) - BILATERAL SKIN-SPARING MASTECTOMIES BILATERAL AXILLARY SENTINEL NODE BIOPSIES INSERTION, TISSUE EXPANDER (Bilateral) - BILATERAL TISSUE EXPANDER WITH MESH; POSSIBLE BILATERAL ADJACENT TISSUE REARRANGEMENT ICG ANGIOGRAPHY ADJACENT TISSUE TRANSFER (Bilateral) as a surgical intervention.  The patient's history has been reviewed, patient examined, no change in status, stable for surgery.  I have reviewed the patient's chart and labs.  Questions were answered to the patient's satisfaction.     Hyla Coard M Darlinda Bellows

## 2024-11-21 NOTE — Interval H&P Note (Signed)
 History and Physical Interval Note:  11/21/2024 7:20 AM  Kim Armstrong  has presented today for surgery, with the diagnosis of BILATERAL BREAST CANCER.  The various methods of treatment have been discussed with the patient and family. After consideration of risks, benefits and other options for treatment, the patient has consented to  Procedures with comments: MASTECTOMY WITH SENTINEL LYMPH NODE BIOPSY (Bilateral) - BILATERAL SKIN-SPARING MASTECTOMIES BILATERAL AXILLARY SENTINEL NODE BIOPSIES INSERTION, TISSUE EXPANDER (Bilateral) - BILATERAL TISSUE EXPANDER WITH MESH; POSSIBLE BILATERAL ADJACENT TISSUE REARRANGEMENT ICG ANGIOGRAPHY ADJACENT TISSUE TRANSFER (Bilateral) as a surgical intervention.  The patient's history has been reviewed, patient examined, no change in status, stable for surgery.  I have reviewed the patient's chart and labs.  Questions were answered to the patient's satisfaction.     Donnice Bury

## 2024-11-21 NOTE — Discharge Instructions (Addendum)
 INSTRUCTIONS FOR AFTER BREAST SURGERY   You will likely have some questions about what to expect following your operation.  The following information will help you and your family understand what to expect when you are discharged from the hospital.  It is important to follow these guidelines to help ensure a smooth recovery and reduce complication.  Postoperative instructions include information on: diet, wound care, medications and physical activity.  AFTER SURGERY Expect to go home after the procedure.  In some cases, you may need to spend one night in the hospital for observation.  DIET Breast surgery does not require a specific diet.  However, the healthier you eat the better your body will heal. It is important to increasing your protein intake.  This means limiting the foods with sugar and carbohydrates.  Focus on vegetables and some meat.  If you have liposuction during your procedure be sure to drink water.  If your urine is bright yellow, then it is concentrated, and you need to drink more water.  As a general rule after surgery, you should have 8 ounces of water every hour while awake.  If you find you are persistently nauseated or unable to take in liquids let us  know.  NO TOBACCO USE or EXPOSURE.  This will slow your healing process and lead to a wound.  WOUND CARE Leave the binder on at all times except when showering . Use fragrance free soap like Dial, Dove or Ivory.   DO NOT SHOWER  / GET THE SURGICAL SITE WET WHILE THE DRAINS ARE IN PLACE No baths, pools or hot tubs for four weeks. We close your incision to leave the smallest and best-looking scar. No ointment or creams on your incisions for four weeks.  No Neosporin (Too many skin reactions).  A few weeks after surgery you can use Mederma and start massaging the scar. We ask you to wear your binder or sports bra for the first 6 weeks around the clock, including while sleeping. This provides added comfort and helps reduce the fluid  accumulation at the surgery site. NO Ice or heating pads to the operative site.  You have a very high risk of a BURN before you feel the temperature change. Continue to empty, recharge, & record drainage from drains 2-3 times a day, as needed.   ACTIVITY No heavy lifting until cleared by the doctor.  This usually means no more than a half-gallon of milk.  It is OK to walk and climb stairs. Moving your legs is very important to decrease your risk of a blood clot.  It will also help keep you from getting deconditioned.  Every 1 to 2 hours get up and walk for 5 minutes. This will help with a quicker recovery back to normal.  Let pain be your guide so you don't do too much.  This time is for you to recover.  You will be more comfortable if you sleep and rest with your head elevated either with a few pillows under you or in a recliner.  No stomach sleeping for a three months.  WORK Everyone returns to work at different times. As a rough guide, most people take at least 1 - 2 weeks off prior to returning to work. If you need documentation for your job, give the forms to the front staff at the clinic.  DRIVING Arrange for someone to bring you home from the hospital after your surgery.  You may be able to drive a few days after  surgery but not while taking any narcotics or valium.  BOWEL MOVEMENTS Constipation can occur after anesthesia and while taking pain medication.  It is important to stay ahead for your comfort.  We recommend taking Milk of Magnesia (2 tablespoons; twice a day) while taking the pain pills.  MEDICATIONS You may be prescribed should start after surgery At your preoperative visit for you history and physical you may have been given the following medications: Zofran  4 mg:  This is to treat nausea and vomiting.  You can take this every 6 hours as needed and only if needed. Oxycodone  5 mg:  This is only to be used after you have taken the Motrin  or the Tylenol . Every 8 hours as  needed.   Over the counter Medication to take: Ibuprofen  (Motrin ) 600 mg:  Take this every 6 hours.  If you have additional pain then take 500 mg of the Tylenol  every 8 hours.  Only take the Norco after you have tried these two. MiraLAX or Milk of Magnesia: Take this according to the bottle if you take the Norco.  WHEN TO CALL Call your surgeon's office if any of the following occur: Fever 101 degrees F or greater Excessive bleeding or fluid from the incision site. Pain that increases over time without aid from the medications Redness, warmth, or pus draining from incision sites Persistent nausea or inability to take in liquids Severe misshapen area that underwent the operation.  Here are some resources for breast cancer patients:  Plastic surgery website: https://www.plasticsurgery.org/for-medical-professionals/education-and-resources/publications/breast-reconstruction-magazine Breast Reconstruction Awareness Campaign:  chesscontest.fr Plastic surgery Implant information:  https://www.plasticsurgery.org/patient-safety/breast-implant-safety    About my Jackson-Pratt Bulb Drain  What is a Jackson-Pratt bulb? A Jackson-Pratt is a soft, round device used to collect drainage. It is connected to a long, thin drainage catheter, which is held in place by one or two small stiches near your surgical incision site. When the bulb is squeezed, it forms a vacuum, forcing the drainage to empty into the bulb.  Emptying the Jackson-Pratt bulb- To empty the bulb: 1. Release the plug on the top of the bulb. 2. Pour the bulb's contents into a measuring container which your nurse will provide. 3. Record the time emptied and amount of drainage. Empty the drain(s) as often as your     doctor or nurse recommends.  Date                  Time                    Amount (Drain right)                 Amount (Drain  left)  _____________________________________________________________________  _____________________________________________________________________  _____________________________________________________________________  _____________________________________________________________________  _____________________________________________________________________  _____________________________________________________________________  _____________________________________________________________________  _____________________________________________________________________  Squeezing the Jackson-Pratt Bulb- To squeeze the bulb: 1. Make sure the plug at the top of the bulb is open. 2. Squeeze the bulb tightly in your fist. You will hear air squeezing from the bulb. 3. Replace the plug while the bulb is squeezed. 4. Use a safety pin to attach the bulb to your clothing. This will keep the catheter from     pulling at the bulb insertion site.  When to call your doctor- Call your doctor if: Drain site becomes red, swollen or hot. You have a fever greater than 101 degrees F. There is oozing at the drain site. Drain falls out (apply a guaze bandage over the drain hole and secure it with tape).  Drainage increases daily not related to activity patterns. (You will usually have more drainage when you are active than when you are resting.) Drainage has a bad odor.

## 2024-11-21 NOTE — Anesthesia Procedure Notes (Signed)
 Procedure Name: Intubation Date/Time: 11/21/2024 8:04 AM  Performed by: Emilio Rock BIRCH, CRNAPre-anesthesia Checklist: Patient identified, Emergency Drugs available, Suction available and Patient being monitored Patient Re-evaluated:Patient Re-evaluated prior to induction Oxygen Delivery Method: Circle system utilized Preoxygenation: Pre-oxygenation with 100% oxygen Induction Type: IV induction Ventilation: Mask ventilation without difficulty Laryngoscope Size: Mac and 3 Grade View: Grade I Tube type: Oral Tube size: 7.0 mm Number of attempts: 1 Airway Equipment and Method: Stylet and Oral airway Placement Confirmation: ETT inserted through vocal cords under direct vision, positive ETCO2 and breath sounds checked- equal and bilateral Secured at: 22 cm Tube secured with: Tape Dental Injury: Teeth and Oropharynx as per pre-operative assessment

## 2024-11-21 NOTE — Transfer of Care (Signed)
 Immediate Anesthesia Transfer of Care Note  Patient: Kim Armstrong  Procedure(s) Performed: MASTECTOMY WITH SENTINEL LYMPH NODE BIOPSY (Bilateral: Breast) INSERTION, TISSUE EXPANDER (Bilateral: Chest) ADJACENT TISSUE TRANSFER (Bilateral)  Patient Location: PACU  Anesthesia Type:GA combined with regional for post-op pain  Level of Consciousness: awake, alert , oriented, drowsy, and patient cooperative  Airway & Oxygen Therapy: Patient Spontanous Breathing and Patient connected to face mask oxygen  Post-op Assessment: Report given to RN and Post -op Vital signs reviewed and stable  Post vital signs: Reviewed and stable  Last Vitals:  Vitals Value Taken Time  BP 116/78 11/21/24 14:48  Temp 36.7 C 11/21/24 14:48  Pulse 66 11/21/24 14:54  Resp 16 11/21/24 14:54  SpO2 99 % 11/21/24 14:54  Vitals shown include unfiled device data.  Last Pain:  Vitals:   11/21/24 1448  TempSrc:   PainSc: Asleep      Patients Stated Pain Goal: 3 (11/21/24 0640)  Complications: No notable events documented.

## 2024-11-21 NOTE — Anesthesia Preprocedure Evaluation (Addendum)
"                                    Anesthesia Evaluation  Patient identified by MRN, date of birth, ID band Patient awake    Reviewed: Allergy & Precautions, H&P , NPO status , Patient's Chart, lab work & pertinent test results  Airway Mallampati: II  TM Distance: >3 FB Neck ROM: Full    Dental no notable dental hx. (+) Teeth Intact, Dental Advisory Given   Pulmonary former smoker   Pulmonary exam normal breath sounds clear to auscultation       Cardiovascular negative cardio ROS  Rhythm:Regular Rate:Normal     Neuro/Psych  Headaches  negative psych ROS   GI/Hepatic Neg liver ROS,GERD  ,,  Endo/Other  negative endocrine ROS    Renal/GU negative Renal ROS  negative genitourinary   Musculoskeletal   Abdominal   Peds  Hematology negative hematology ROS (+)   Anesthesia Other Findings   Reproductive/Obstetrics negative OB ROS                              Anesthesia Physical Anesthesia Plan  ASA: 2  Anesthesia Plan: General   Post-op Pain Management: Regional block* and Tylenol  PO (pre-op)*   Induction: Intravenous  PONV Risk Score and Plan: 4 or greater and Ondansetron , Dexamethasone  and Midazolam   Airway Management Planned: Oral ETT  Additional Equipment:   Intra-op Plan:   Post-operative Plan: Extubation in OR  Informed Consent: I have reviewed the patients History and Physical, chart, labs and discussed the procedure including the risks, benefits and alternatives for the proposed anesthesia with the patient or authorized representative who has indicated his/her understanding and acceptance.     Dental advisory given  Plan Discussed with: CRNA  Anesthesia Plan Comments:          Anesthesia Quick Evaluation  "

## 2024-11-21 NOTE — H&P (Signed)
 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 She underwent primary TCHP followed by lumpectomy and a sentinel node biopsy at [facility].SABRA She had a complete pathologic response. She then underwent radiotherapy completed her anti-HER2 therapy and did tamoxifen  for 7 years. She has had no issues since. She works here in [place]. She has a family history of breast cancer in her mother at a young age. She has negative genetics at the last cancer. She underwent a mammogram that shows her to have C density breast tissue. There are some grouped calcifications in the right breast measuring 7 mm. These are located in the anterior depth of the lower inner quadrant. She underwent a biopsy of this. This shows her to have ductal carcinoma in situ with 2's foci of microinvasion. This is 100% er pos, 10% pr pos Since then she has undergone cem that shows a 1x1x0.8 cm subareolar mass and this measures 1x0.6x1 cm on US . US  of both axillae are negative biopsy of the left breast mass is III IDC that is 70% er pos, 15% pr pos, her 2 pos and Ki is 10%. She has had plastic surgery consults at this point and returns for final plan  Review of Systems: A complete review of systems was obtained from the patient. I have reviewed this information and discussed as appropriate with the patient. See HPI as well for other ROS.  Review of Systems  All other systems reviewed and are negative.   Medical History: Past Medical History:  Diagnosis Date  GERD (gastroesophageal reflux disease)  History of cancer  Hyperlipidemia   Patient Active Problem List  Diagnosis  Bilateral malignant neoplasm of breast in female (CMS/HHS-HCC)   Past Surgical History:  Procedure Laterality Date  .endometrial ablation  MASTECTOMY PARTIAL / LUMPECTOMY   No Known Allergies  Current Outpatient Medications on File Prior to Visit  Medication Sig Dispense Refill  EMGALITY PEN 120 mg/mL PnIj INJECT 1 ML INTO  THE SKIN EVERY 30 DAYS.  rizatriptan (MAXALT) 5 MG tablet Take 5 mg by mouth as directed for Migraine May take a second dose after 2 hours if needed.  UBRELVY 100 mg Tab Take 100 mg by mouth at bedtime as needed   Family History  Problem Relation Age of Onset  Hyperlipidemia (Elevated cholesterol) Mother  Breast cancer Mother  Colon cancer Father    Social History   Tobacco Use  Smoking Status Never  Smokeless Tobacco Never  Marital status: Married  Tobacco Use  Smoking status: Never  Smokeless tobacco: Never  Vaping Use  Vaping status: Unknown  Substance and Sexual Activity  Alcohol use: Yes  Alcohol/week: 0.0 - 1.0 standard drinks of alcohol  Drug use: Never  Sexual activity: Yes  Partners: Male    Objective:   Vitals:   Physical Exam   General nad No breast mass, no ax adenopathy  Assessment and Plan:   Bilateral malignant neoplasm of breast in female, unspecified estrogen receptor status, unspecified site of breast (CMS/HHS-HCC)  bilateral mastectomies and bilateral ax sn biopsies  We discussed today that for her 2 pos tumor on the left after prior bct/radiotherapy she really needs mastectomy could try something different to salvage breast if different prognostic panel but she needs mastectomy and attempt to repeat left ax sn. Discussed that- don't think she is nsm candidate due to tumor and breast. Will plan for bilateral ssm and sn biopy on left combined with TE here. Oncology would  like to hold on port until we get final pathology  "

## 2024-11-21 NOTE — Progress Notes (Signed)
Assisted Dr. Autumn Patty with left, right, pectoralis, ultrasound guided block. Side rails up, monitors on throughout procedure. See vital signs in flow sheet. Tolerated Procedure well.

## 2024-11-21 NOTE — Anesthesia Postprocedure Evaluation (Signed)
"   Anesthesia Post Note  Patient: Kim Armstrong  Procedure(s) Performed: MASTECTOMY WITH SENTINEL LYMPH NODE BIOPSY (Bilateral: Breast) INSERTION, TISSUE EXPANDER (Bilateral: Chest) ADJACENT TISSUE TRANSFER (Bilateral)     Patient location during evaluation: PACU Anesthesia Type: General and Regional Level of consciousness: awake and alert Pain management: pain level controlled Vital Signs Assessment: post-procedure vital signs reviewed and stable Respiratory status: spontaneous breathing, nonlabored ventilation and respiratory function stable Cardiovascular status: blood pressure returned to baseline and stable Postop Assessment: no apparent nausea or vomiting Anesthetic complications: no   No notable events documented.  Last Vitals:  Vitals:   11/21/24 1559 11/21/24 1600  BP:  (!) 124/92  Pulse: 83 84  Resp: 13 13  Temp:    SpO2: 100% 100%    Last Pain:  Vitals:   11/21/24 1545  TempSrc:   PainSc: 4                  Jessi Pitstick,W. EDMOND      "

## 2024-11-21 NOTE — Op Note (Addendum)
 Operative Note   DATE OF OPERATION: 11/21/2024  LOCATION: [facility]  SURGICAL DEPARTMENT: Plastic Surgery  PREOPERATIVE DIAGNOSES:  Breast cancer   POSTOPERATIVE DIAGNOSES:  same  PROCEDURE:   1. Bilateral Breast reconstruction with insertion of tissue expanders (CPT CODE 19357-50).  Right breast: Mentor Artoura High profile tissue expander 350 REF: [id number] SN: [id number] Left breast: Mentor Artoura High profile tissue expander 350 REF: [id number] SN: [id number]  2. Bilateral adjacent tissue transfer of inferior pole adipodermal flaps with back cuts medially and laterally to encompass tissue expander for coverage and secured to chest wall, measuring approximately 12 cm x 7 cm on the left and 13 cm x 9 cm on right.  3. MODIFIER: -22 We used the -22 modifier because of the additional length and complexity of surgery over and above a usual tissue expander placement requiring significantly greater effort, time, and technical difficulty of procedure. This is related to the fact that we created an inferior based vascularized dermal flap that was utilized to cover the lower portion of the expander.  4. Bilateral implantation of mesh for tissue reinforcement, Ovitex PRS mesh 20 x 20.5cm x2 (CPT CODE 15777-50)  5. Injection ICG dye for assessment of vascular flow to bilateral mastectomy flaps via Fluorescein ICG SPY Angiography (CPT CODE 84139)  SURGEON: Nieves Client, MD  ASSISTANT: Estefana Peck, PA  ANESTHESIA:  General.   COMPLICATIONS: None.   EBL < 100 cc  INDICATIONS FOR PROCEDURE:  The patient, Kim Armstrong is a 49 y.o. female born on December 12, 1975, is here for treatment of breast cancer.  MRN: [id number]  Findings: ICG SPY angiography demonstrated skin flap necrosis along the T-junction of the left mastectomy flap and along incisions of the lateral flap. The skin in this area was sharply excised and debrided until more clinically well perfused mastectomy skin  was encountered. At the end of the case, SPY imaging demonstrated delayed perfusion to the T junction bilaterally but still lit up on imaging and so it was deemed clinically adequate perfusion to be able to leave the tissue expanders in place.  CONSENT:  Risks, benefits, and alternatives to surgery were discussed with the patient including but not limited to bleeding, infection, scarring, pain, mastectomy flap necrosis, implant malposition, loss of the implant, infection to the implant, need for re-operation, need for further surgeries, asymmetry, unaesthetic results, capsular contracture, damage to surrounding structures, need for revision procedures, deep vein thrombosis/pulmonary embolus, death. The patient voiced understanding and wished to proceed. A written consent was signed. Please see original consult note for details on the informed consent discussion.  DESCRIPTION OF PROCEDURE:  The patient was taken to the operating room. SCDs were placed and IV antibiotics were given. The patient's operative site was prepped and draped in a sterile fashion. A time out was performed and all information was confirmed to be correct.  General anesthesia was administered.    The beginning portion of the procedure will be dictated by Dr. Ebbie - please refer to his note for more information.  At conclusion of the mastectomies, we entered the room. Anesthesia was instructed to inject 5cc of indocyanine green dye into her veins via the IV and a 10cc flush to follow. Using fluorescence angiography imaging, I was able to assess the mastectomy flaps for perfusion. ICG SPY angiography demonstrated skin flap necrosis along the T-junction of the left mastectomy flap and along incisions of the lateral flap. The skin in this area was sharply excised and debrided until more clinically well  perfused mastectomy skin was encountered.    We started by first de-epithelializing the inferiorly based adipodermal flaps that  measured to be approximately 12 cm x 7 cm on the left and 13 cm x 9 cm on right. We created back cuts medially and laterally along the inframammary fold crease along the flap in order to shape and advance the inferiorly based flap to cover the expander that we were going to insert.   Her chest wall diameter measured approximately 11 cm from medial to lateral margin of the pectoralis major muscle.  Patient will need to undergo nephrectomy in 4 weeks and will need to be on lateral decubitus.  Patient's goals is to undergo a DIEP flap and wish to be an A/B cup, so the decision was made to place a smaller expander and therefore Mentor Artoura High profile tissue expander 350 cc tissue expanders were selected. They were opened from their packaging and soaked in Vashe solution. The Ovitex PRS mesh was soaked with Vashe solution within its packaging. The same procedure was performed on the other tissue expander. On the sterile back table, the tissue expanders were deflated and wrapped with 20x20cm Alloderm mesh and the mesh secured on the posterior wall of the expander using 2-0 Prolene.  Hemostasis was achieved within each breast pocket and it was irrigated thoroughly with betadine . Two 15 French JP drains were placed within the each breast pocket and secured into place with a 3-0 nylon. The expander-mesh construct was then secured into in the pre pectoral plane on the anterior pectoralis major fascia using 2-0 Prolene at the 3, 6, and 9 o'clock expander tabs and then a suture tab was made out of the Ovitesx mesh at the 11 and 1 o'clock positions. Sutures were tied down tightly to the anterior pectoralis major fascia. The inferiorly based adipodermal flap was then tacked to the mesh that was covering the tissue expander with 2-0 PDS sutures. The tissue expander was filled with 50 cc injectable saline per side.  The same procedure was performed on both breasts.  The skin was closed with 2-0 PDS deep interrupted  stitches of the subcutaneous tissue, then 3-0 PDS deep dermal buried interrupted stitches followed by a running 3-0 subcuticular monocryl stitch.  The entire chest was cleansed with lap sponge soaked in antibiotic solution and dried. Mastasol and steri strips were placed on the incisions. The drains were dressed with BioPatches and CHG Tegaderm dressings. Symmetry, as assessed on the table, was acceptable.  Another 5cc of ICG dye was injected by anesthesia into her vein and followed by a 10cc flush to assess for mastectomy flap perfusion using fluorescence angiography imaging and the flaps were found to be well perfused after tissue expander placement but delayed laterally on the left side. Nitropaste was instructed to be applied to this area post operatively.  She was awakened from general anesthesia and extubated without incident. All sponge, instrument and needle counts were correct at the conclusion of the procedure. Patient tolerated the procedure well with no known complications. Surgical wounds were dressed with Kerlix fluff gauze, ABD and loose-fitting surgical bra.  The patient tolerated the procedure well.  There were no complications. The patient was allowed to wake from anesthesia, extubated and taken to the recovery room in satisfactory condition.   The advanced practice practitioner (APP) assisted throughout the case.  The APP was essential in retraction and counter traction when needed to make the case progress smoothly.  This retraction and assistance made it  possible to see the tissue plans for the procedure.  The assistance was needed for blood control, tissue re-approximation and assisted with closure of the incision site.   Joelie Schou M. Brennan Karam, MD [facility]

## 2024-11-21 NOTE — Anesthesia Procedure Notes (Signed)
 Anesthesia Regional Block: Pectoralis block   Pre-Anesthetic Checklist: , timeout performed,  Correct Patient, Correct Site, Correct Laterality,  Correct Procedure, Correct Position, site marked,  Risks and benefits discussed,  Pre-op evaluation,  At surgeon's request and post-op pain management  Laterality: Left and Right  Prep: Maximum Sterile Barrier Precautions used, chloraprep       Needles:  Injection technique: Single-shot  Needle Type: Echogenic Stimulator Needle     Needle Length: 9cm  Needle Gauge: 21     Additional Needles:   Procedures:,,,, ultrasound used (permanent image in chart),,    Narrative:  Start time: 11/21/2024 7:33 AM End time: 11/21/2024 7:43 AM Injection made incrementally with aspirations every 5 mL.  Performed by: Personally  Anesthesiologist: Epifanio Fallow, MD  Additional Notes: Bilateral PECS blocks

## 2024-11-21 NOTE — Progress Notes (Signed)
 At 1655 this nurse scanned patients IV fluids and connected to patients IV. Patient stated IV burned. This nurse stopped IV fluids at 1710 flushed IV with normal saline flush. Another nurse attempted to start a new IV but was unsuccessful. Patients IV had good blood return. At 1720 Dr Montorfano came to check on patients. This nurse stated patient was complaining of IV hurting when fluids where running. He stated to start new IV for antibiotic if patient complained of IV hurting.

## 2024-11-21 NOTE — Op Note (Signed)
 Preoperative diagnosis: Bilateral breast cancer Postoperative diagnosis: Same as above Procedure: 1 right skin sparing mastectomy 2.  Injection of mag trace for right sided sentinel lymph node biopsy 3.  Right deep axillary sentinel lymph node biopsy 4.  Left skin sparing mastectomy 5.  Injection of mag trace for left-sided sentinel lymph node biopsy 6.  Left deep axillary sentinel lymph node biopsy Surgeon: Dr. Adina Bury Anesthesia: General With bilateral pectoral blocks Estimated blood loss: Less than 100 cc Specimens: 1.  Right breast tissue marked short superior, long lateral 2.  Right deep axillary sentinel lymph nodes with highest count of 895 3.  Left breast tissue marked short superior, long lateral 4.  Left deep axillary sentinel lymph nodes Sponge and count was correct completion Case was turned over to plastic surgery for reconstruction  Indications: This is a 49 year old female who presented with a right breast cancer.  She has a previous history of a triple positive invasive ductal carcinoma that she underwent lumpectomy and sentinel node biopsy followed by radiotherapy.  She underwent a mammogram that showed some right breast calcifications.  Biopsy showed DCIS with 2 foci of microinvasion that was hormone receptor positive.  She also underwent a contrast mammogram that showed a subareolar mass on the left side.  Both of her axilla were negative.  The left-sided biopsy showed grade 3 invasive ductal carcinoma that is HER2 positive.  We do not discussed all of her options and she is elected proceed with bilateral mastectomy, bilateral sentinel lymph node biopsy as well as expander reconstruction.  Procedure: After informed consent was obtained she was taken to the operating room.  She underwent bilateral pectoral blocks first.  She was given antibiotics.  SCDs were in place.  She was then placed under general anesthesia without complication.  A Foley catheter was placed.  She  was prepped and draped in a sterile sterile surgical fashion.  Surgical timeout was then performed.  I injected 2 cc of mag trace in the subareolar position of both breast.  I massaged these for 5 minutes.  I then did the right mastectomy first.  I used part of the incision that was planned for the reconstruction.  I then created flaps to the clavicle, sternum, inframammary fold as well as the latissimus laterally.  The breast tissue and the fascia were then removed from the pectoralis muscle.  I then was able to enter the axilla.  There were a couple of nodes that were brown in both had activity with the highest listed above.  I remove these nodes.  There was no background activity or any additional palpable nodes.  I then placed a TXA soaked sponge in this cavity.  Hemostasis was obtained and visualized later.   I did the left mastectomy next.  I used part of the incision that was planned for the reconstruction again and created flaps in a similar fashion.  The breast tissue and the fascia were then removed the pectoralis muscle.  These were marked and passed off the table.  I entered the axilla on this side.  There was very little activity but with there were a couple of visible nodes present that I removed.  There was no other activity and no palpable nodes present.  I obtained hemostasis and then placed a TXA soaked sponge.  The case was then turned turned over to plastic surgery for reconstruction.

## 2024-11-22 ENCOUNTER — Encounter (HOSPITAL_BASED_OUTPATIENT_CLINIC_OR_DEPARTMENT_OTHER): Payer: Self-pay | Admitting: General Surgery

## 2024-11-22 DIAGNOSIS — C50912 Malignant neoplasm of unspecified site of left female breast: Secondary | ICD-10-CM | POA: Diagnosis not present

## 2024-11-22 MED ORDER — CYCLOBENZAPRINE HCL 5 MG PO TABS: 5.0000 mg | ORAL_TABLET | Freq: Three times a day (TID) | ORAL | 1 refills | Status: AC | PRN

## 2024-11-22 MED ORDER — DOXYCYCLINE HYCLATE 100 MG PO CAPS: 100.0000 mg | ORAL_CAPSULE | Freq: Two times a day (BID) | ORAL | 0 refills | Status: AC

## 2024-11-22 NOTE — Discharge Summary (Signed)
 Physician Discharge Summary  Patient ID: Kim Armstrong MRN: [id number] DOB/AGE: 1976/11/03 49 y.o.  Admit date: 11/21/2024 Discharge date: 11/22/2024  Admission Diagnoses: Breast cancer  Discharge Diagnoses:  Principal Problem:   Breast cancer [facility])   Discharged Condition: good  Hospital Course: Patient presented yesterday for bilateral mastectomies with SNLB and immediate reconstruction with bilateral tissue expanders, adjacent tissue transfers and mesh reinforcement. Patient tolerated the procedure well. Pain has been under control. This morning  bilateral breast flaps look viable, bilateral JP output has been appropriate. Patient meets criteria for discharge today. Follow up early next week.   Consults: general surgery  Significant Diagnostic Studies: ICG angiography  Discharge Exam: Blood pressure 106/71, pulse 65, temperature 98.6 F (37 C), resp. rate 16, height 5' 7 (1.702 m), weight 82.5 kg, last menstrual period 11/18/2024, SpO2 99%. RN as chaperone Patient A&O x 3 Bilateral breast flaps look viable. Dressing in place.  Jp's serosanguinous bilaterally. Adequate output.  No hematomas, seromas or signs of infection.   Disposition: Discharge disposition: 01-Home or Self Care       Discharge Instructions     Call MD for:  difficulty breathing, headache or visual disturbances   Complete by: As directed    Call MD for:  extreme fatigue   Complete by: As directed    Call MD for:  hives   Complete by: As directed    Call MD for:  persistant dizziness or light-headedness   Complete by: As directed    Call MD for:  persistant nausea and vomiting   Complete by: As directed    Call MD for:  redness, tenderness, or signs of infection (pain, swelling, redness, odor or green/yellow discharge around incision site)   Complete by: As directed    Call MD for:  severe uncontrolled pain   Complete by: As directed    Call MD for:  temperature >100.4   Complete by:  As directed    Diet general   Complete by: As directed    Increase activity slowly   Complete by: As directed    No arms above shoulders, no heavy lifiting   No wound care   Complete by: As directed       Allergies as of 11/22/2024   No Known Allergies   Med Rec must be completed prior to using this [facility]       Follow-up Information     Ebbie Cough, MD Follow up in 3 week(s).   Specialty: General Surgery Contact information: [address] [place] [address] [phone]         [facility] Follow up.   Specialty: Plastic Surgery Why: Follow up at your next scheduled appointment Contact information: [address] [place]  [address] [phone]               Signed: Nieves HERO Yoona Ishii 11/22/2024, 8:25 AM

## 2024-11-27 ENCOUNTER — Other Ambulatory Visit: Payer: Self-pay | Admitting: Urology

## 2024-11-27 ENCOUNTER — Encounter: Payer: Self-pay | Admitting: *Deleted

## 2024-11-27 NOTE — Progress Notes (Signed)
 Dr. Odean wants breast progs repeated on surgical path from 1/15. Contacted [facility] as chart states in process for path at this time. Informed [facility] via email that breast progs need to be repeated. Awaiting return response. Patient has f/u with Dr. Gudena to discuss on 2/12.

## 2024-11-29 ENCOUNTER — Other Ambulatory Visit: Payer: Self-pay

## 2024-11-29 ENCOUNTER — Encounter

## 2024-11-29 ENCOUNTER — Ambulatory Visit

## 2024-11-29 DIAGNOSIS — Z170 Estrogen receptor positive status [ER+]: Secondary | ICD-10-CM

## 2024-11-29 DIAGNOSIS — N62 Hypertrophy of breast: Secondary | ICD-10-CM

## 2024-11-29 DIAGNOSIS — C50412 Malignant neoplasm of upper-outer quadrant of left female breast: Secondary | ICD-10-CM

## 2024-11-29 DIAGNOSIS — M542 Cervicalgia: Secondary | ICD-10-CM

## 2024-11-29 DIAGNOSIS — Z9889 Other specified postprocedural states: Secondary | ICD-10-CM

## 2024-11-29 NOTE — Progress Notes (Signed)
" ° °  Established Patient Office Visit  Subjective   Patient ID: Kim Armstrong, female    DOB: July 24, 1976  Age: 49 y.o. MRN: [id number]  HPI Patient is a 49 year old female status post bilateral breast reconstruction with insertion of tissue expanders bilateral adjacent tissue transfer of inferior pole epidermal flap with backcut's medially and laterally to encompass tissue expanders for coverage and secured to the chest wall, bilateral mesh reinforcement and indocyanine green angiography on 11/21/2024.  Patient is doing well after surgery.  Her pain has been under control.  Drain output remains above 30 cc/day bilaterally.  She has not remove the binder since surgery.  Patient comes accompanied by husband.    Objective:     There were no vitals taken for this visit. BP Readings from Last 3 Encounters:  11/22/24 106/71  11/15/24 120/86  09/26/24 130/82    Physical Exam MA as chaperone Bilateral breast flaps look well-perfused.  JP drains serosanguineous bilaterally.  All dressings in place.  No hematomas, seromas or signs of infection.  We placed injectable saline in the Expander using a sterile technique: Right: 50 cc for a total of 100 cc Left: 50 cc for a total of 100 cc  No results found for any visits on 11/29/24.  Last CBC Lab Results  Component Value Date   WBC 4.7 09/16/2024   HGB 13.9 09/16/2024   HCT 39.4 09/16/2024   MCV 86.6 09/16/2024   MCH 30.5 09/16/2024   RDW 12.3 09/16/2024   PLT 262 09/16/2024   Last metabolic panel Lab Results  Component Value Date   GLUCOSE 96 09/16/2024   NA 136 09/16/2024   K 4.0 09/16/2024   CL 104 09/16/2024   CO2 26 09/16/2024   BUN 11 09/16/2024   CREATININE 0.87 09/16/2024   GFRNONAA >60 09/16/2024   CALCIUM  10.0 09/16/2024   PROT 7.6 09/16/2024   ALBUMIN  4.7 09/16/2024   BILITOT 0.4 09/16/2024   ALKPHOS 54 09/16/2024   AST 17 09/16/2024   ALT 14 09/16/2024   ANIONGAP 6 09/16/2024    The 10-year ASCVD  risk score (Arnett DK, et al., 2019) is: 1.5%    Assessment & Plan:   Problem List Items Addressed This Visit       Other   Breast cancer (HCC) - Primary   Other Visit Diagnoses       Cervicalgia         Hypertrophy of breast          Patient tolerated tissue expansion well today. Drains will remain in place until output is less than 30 cc for 3 days. Patient will undergo a nephrectomy in approximately 3 weeks for cancer.  Patient will need to be well-padded using foam on bilateral breasts and minimizing pressure from beanbag as much as possible.  This will be discussed with her surgical oncologist. Patient will follow-up next week for potential further expansion.  All questions answered to patient and husband satisfaction.  Sherley Leser M Cerinity Zynda, MD  "

## 2024-11-30 MED ORDER — GABAPENTIN 300 MG PO CAPS: 300.0000 mg | ORAL_CAPSULE | Freq: Two times a day (BID) | ORAL | 0 refills | Status: AC

## 2024-11-30 NOTE — Addendum Note (Signed)
 Addended by: EUSTACIO POUR on: 11/30/2024 04:12 PM   Modules accepted: Orders

## 2024-12-05 ENCOUNTER — Other Ambulatory Visit: Payer: Self-pay | Admitting: Student

## 2024-12-06 ENCOUNTER — Other Ambulatory Visit: Payer: Self-pay | Admitting: Radiology

## 2024-12-06 ENCOUNTER — Encounter: Payer: Self-pay | Admitting: Student

## 2024-12-06 ENCOUNTER — Ambulatory Visit: Admitting: Student

## 2024-12-06 VITALS — BP 125/90 | HR 85 | Ht 67.0 in | Wt 175.0 lb

## 2024-12-06 DIAGNOSIS — Z9889 Other specified postprocedural states: Secondary | ICD-10-CM

## 2024-12-06 DIAGNOSIS — N2889 Other specified disorders of kidney and ureter: Secondary | ICD-10-CM

## 2024-12-06 DIAGNOSIS — C50412 Malignant neoplasm of upper-outer quadrant of left female breast: Secondary | ICD-10-CM

## 2024-12-06 DIAGNOSIS — Z170 Estrogen receptor positive status [ER+]: Secondary | ICD-10-CM

## 2024-12-06 NOTE — Progress Notes (Signed)
 Patient is a 49 year old female who recently underwent right skin-sparing mastectomy, right deep axillary sentinel lymph node biopsy, left skin sparing mastectomy and left deep axillary sentinel lymph node biopsy with Dr. Ebbie followed by bilateral breast reconstruction with insertion of 350 cc tissue expanders with Dr. Montorfano on 11/21/2024.  Intraoperatively, 50 cc of injectable saline was injected to each side.  Patient is about 2 weeks out from her surgery.  She presents to the clinic today for postoperative follow-up.  Patient was last seen in the clinic on 11/29/2024.  At this visit, patient was doing well.  Her pain was under control.  Her drain output was above 30 cc/day for each side.  On exam, bilateral breast flaps looked well-perfused.  JP drains had serosanguineous drainage bilaterally.  Injectable saline was placed in each expander for a total of 100 cc in each expander.  Today, patient presents with her husband at bedside.  She states that overall she is doing well.  She does not report any new issues or concerns.  She does not report any fevers or chills.  She states she did well after her last expander fill.  Patient reports that her drain output on the left side has been minimal, less than 10 to 15 cc/day for the past few days.  Her right side has still been slightly elevated, 45 cc several days ago, and then less than 20 cc the following days.  Chaperone present on exam.  On exam, patient is sitting upright in no acute distress.  Expanders are in place bilaterally and are soft.  There is no overlying erythema, no obvious fluid collection on exam.  There is mild ecchymosis noted bilaterally.  Steri-Strips are in place over the incisions bilaterally.  JP drains are in place bilaterally and functioning with serosanguineous drainage in each of the bulbs.  There are no signs of infection on exam.  Left JP drain was removed without any difficulty.  Patient tolerated well.  Mepilex border  dressing was placed over the drain site.  We placed injectable saline in the Expander using a sterile technique: Right: 50 cc for a total of 150/350 cc Left: 50 cc for a total of 150/350 cc   Discussed with patient to continue with compression at all times and avoid strenuous activities.  Dr. Montorfano also had the opportunity to evaluate the patient today.  Patient to follow back up next week.  Instructed the patient to call with any questions or concerns in the meantime.

## 2024-12-09 ENCOUNTER — Ambulatory Visit (HOSPITAL_COMMUNITY)
Admission: RE | Admit: 2024-12-09 | Discharge: 2024-12-09 | Disposition: A | Source: Ambulatory Visit | Attending: Urology | Admitting: Urology

## 2024-12-09 ENCOUNTER — Encounter (HOSPITAL_COMMUNITY): Payer: Self-pay

## 2024-12-09 VITALS — BP 170/94 | HR 81 | Temp 98.8°F | Resp 16 | Ht 67.0 in | Wt 175.0 lb

## 2024-12-09 DIAGNOSIS — Z9013 Acquired absence of bilateral breasts and nipples: Secondary | ICD-10-CM | POA: Insufficient documentation

## 2024-12-09 DIAGNOSIS — C641 Malignant neoplasm of right kidney, except renal pelvis: Secondary | ICD-10-CM | POA: Insufficient documentation

## 2024-12-09 DIAGNOSIS — N2889 Other specified disorders of kidney and ureter: Secondary | ICD-10-CM

## 2024-12-09 DIAGNOSIS — Z853 Personal history of malignant neoplasm of breast: Secondary | ICD-10-CM | POA: Insufficient documentation

## 2024-12-09 DIAGNOSIS — Z923 Personal history of irradiation: Secondary | ICD-10-CM | POA: Insufficient documentation

## 2024-12-09 DIAGNOSIS — Z9221 Personal history of antineoplastic chemotherapy: Secondary | ICD-10-CM | POA: Insufficient documentation

## 2024-12-09 LAB — CBC
HCT: 37.4 % (ref 36.0–46.0)
Hemoglobin: 12.6 g/dL (ref 12.0–15.0)
MCH: 29.9 pg (ref 26.0–34.0)
MCHC: 33.7 g/dL (ref 30.0–36.0)
MCV: 88.6 fL (ref 80.0–100.0)
Platelets: 364 10*3/uL (ref 150–400)
RBC: 4.22 MIL/uL (ref 3.87–5.11)
RDW: 12 % (ref 11.5–15.5)
WBC: 4.7 10*3/uL (ref 4.0–10.5)
nRBC: 0 % (ref 0.0–0.2)

## 2024-12-09 LAB — PREGNANCY, URINE: Preg Test, Ur: NEGATIVE

## 2024-12-09 LAB — GLUCOSE, CAPILLARY: Glucose-Capillary: 100 mg/dL — ABNORMAL HIGH (ref 70–99)

## 2024-12-09 LAB — PROTIME-INR
INR: 0.9 (ref 0.8–1.2)
Prothrombin Time: 12.1 s (ref 11.4–15.2)

## 2024-12-09 MED ORDER — MIDAZOLAM HCL 2 MG/2ML IJ SOLN
INTRAMUSCULAR | Status: AC
  Filled 2024-12-09: qty 4

## 2024-12-09 MED ORDER — FENTANYL CITRATE (PF) 100 MCG/2ML IJ SOLN
INTRAMUSCULAR | Status: AC | PRN
  Administered 2024-12-09: 50 ug via INTRAVENOUS
  Administered 2024-12-09 (×2): 25 ug via INTRAVENOUS

## 2024-12-09 MED ORDER — FENTANYL CITRATE (PF) 100 MCG/2ML IJ SOLN
INTRAMUSCULAR | Status: AC
  Filled 2024-12-09: qty 4

## 2024-12-09 MED ORDER — MIDAZOLAM HCL (PF) 2 MG/2ML IJ SOLN
INTRAMUSCULAR | Status: AC | PRN
  Administered 2024-12-09 (×2): .5 mg via INTRAVENOUS
  Administered 2024-12-09: 1 mg via INTRAVENOUS

## 2024-12-09 MED ORDER — SODIUM CHLORIDE 0.9 % IV SOLN: INTRAVENOUS | Status: DC

## 2024-12-09 NOTE — H&P (Signed)
 "     Chief Complaint: Patient was seen in consultation today for breast cancer, right renal mass  Referring Physician(s): Borden,Lester  Supervising Physician: Jenna Hacker  Patient Status: [facility] - Out-pt  History of Present Illness: Kim Armstrong is a 49 y.o. 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 She underwent primary TCHP followed by lumpectomy and a sentinel node biopsy at [facility].SABRA She had a complete pathologic response. She then underwent radiotherapy completed her anti-HER2 therapy and did tamoxifen  for 7 years. Recent mammogram with biopsy revealed recurrence of ductal carcinoma in situ with 2's foci of microinvasion.  She is now s/p recent double mastectomy.  Recent imaging as part of her ongoing work-up shows an enhancing solid right lower pole renal mass.  IR consulted for biopsy.   Patient presents today with her husband for biopsy. She is anxious and tearful due to the culmination of medical care/intervention/and diagnosis.  This is reflected in her BP today, taken in the leg due to recent double mastectomy, and is 159/99.   She is otherwise stable and has no new complaints.  She is recovering well and as expected from her recent surgery.  She does not take blood pressure medication at baseline.   She has been NPO.   She is FULL CODE.   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   oncologist--- dr odean;   dx 10/ 2015;   Stage IIA,  chemo 09-19-2014  to 11/ 2016 ;   01-29-2015 left breast lumpectomy w/ sln bx;   IMRT completed 04-27-2015   Menorrhagia    Migraine without aura and  without status migrainosus, not intractable    neurologist--  jhonny cook NP    Past Surgical History:  Procedure Laterality Date   ADJACENT TISSUE TRANSFER/TISSUE REARRANGEMENT Bilateral 11/21/2024   Procedure: ADJACENT TISSUE TRANSFER;  Surgeon: Montorfano, Lisandro M, MD;  Location: [facility];  Service: Plastics;  Laterality: Bilateral;   BREAST LUMPECTOMY WITH AXILLARY LYMPH NODE BIOPSY Left 01/29/2015   DILATATION & CURETTAGE/HYSTEROSCOPY WITH MYOSURE N/A 09/28/2022   Procedure: DILATATION & CURETTAGE/HYSTEROSCOPY WITH MYOSURE;  Surgeon: Rosalva Sawyer, MD;  Location[facility];  Service: Gynecology;  Laterality: N/A;   DILITATION & CURRETTAGE/HYSTROSCOPY WITH HYDROTHERMAL ABLATION N/A 09/28/2022   Procedure: DILATATION & CURETTAGE/HYSTEROSCOPY WITH HYDROTHERMAL ABLATION;  Surgeon: Rosalva Sawyer, MD;  Location: [facility];  Service: Gynecology;  Laterality: N/A;   ECTOPIC PREGNANCY SURGERY  05/2009   @[facility] ;    laparoscopic turned laparotomy w/ right salpingectomy   MASTECTOMY W/ SENTINEL NODE BIOPSY Bilateral 11/21/2024   Procedure: MASTECTOMY WITH SENTINEL LYMPH NODE BIOPSY;  Surgeon: Ebbie Cough, MD;  Location: [facility];  Service: General;  Laterality: Bilateral;  BILATERAL SKIN-SPARING MASTECTOMIES BILATERAL AXILLARY SENTINEL NODE BIOPSIES   TISSUE EXPANDER PLACEMENT Bilateral 11/21/2024   Procedure: INSERTION, TISSUE EXPANDER;  Surgeon: Montorfano, Lisandro M, MD;  Location: [facility];  Service: Plastics;  Laterality: Bilateral;  BILATERAL TISSUE EXPANDER WITH MESH; POSSIBLE BILATERAL ADJACENT TISSUE REARRANGEMENT ICG ANGIOGRAPHY    Allergies: Patient has no known  allergies.  Medications: Prior to Admission medications  Medication Sig Start Date End Date Taking? Authorizing Provider  gabapentin  (NEURONTIN ) 300 MG capsule Take 1 capsule (300 mg total) by mouth 2 (two) times daily. 11/30/24 12/30/24 Yes  Montorfano, Lisandro M, MD  ibuprofen  (ADVIL ) 600 MG tablet Take 1 tablet (600 mg total) by mouth every 8 (eight) hours as needed. 11/15/24  Yes Montorfano, Lisandro M, MD  acetaminophen  (TYLENOL ) 500 MG tablet Take 1 tablet (500 mg total) by mouth every 6 (six) hours as needed. 11/15/24   Montorfano, Lisandro M, MD  calcium  carbonate (TUMS - DOSED IN MG ELEMENTAL CALCIUM ) 500 MG chewable tablet Chew 1 tablet by mouth as needed for indigestion or heartburn.    [provider]  cyclobenzaprine  (FLEXERIL ) 5 MG tablet Take 1 tablet (5 mg total) by mouth 3 (three) times daily as needed for muscle spasms. 11/22/24   Montorfano, Lisandro M, MD  EMGALITY 120 MG/ML SOAJ Inject 120 mg into the skin every 30 (thirty) days.    [provider]  ondansetron  (ZOFRAN -ODT) 4 MG disintegrating tablet Take 1 tablet (4 mg total) by mouth every 8 (eight) hours as needed for nausea or vomiting. 11/15/24   Montorfano, Lisandro M, MD  oxyCODONE  (ROXICODONE ) 5 MG immediate release tablet Take 1 tablet (5 mg total) by mouth every 4 (four) hours as needed for severe pain (pain score 7-10). 11/15/24   Montorfano, Lisandro M, MD  rizatriptan (MAXALT) 10 MG tablet Take 10 mg by mouth every 2 (two) hours as needed for migraine. 12/28/21   [provider]  Ubrogepant (UBRELVY) 100 MG TABS Take 100 mg by mouth daily as needed (migraine).    [provider]     Family History  Problem Relation Age of Onset   Breast cancer Mother 65       unilateral   Colon cancer Father 49   Colon polyps Neg Hx    Esophageal cancer Neg Hx    Stomach cancer Neg Hx     Social History   Socioeconomic History   Marital status: Married    Spouse name: Not on file   Number of children: 1   Years of education: Not on file   Highest education level: Not on file  Occupational History   Not on file  Tobacco Use   Smoking status: Former    Current packs/day: 0.00    Average packs/day: 1 pack/day for 2.0 years (2.0  ttl pk-yrs)    Types: Cigarettes    Start date: 02/18/1993    Quit date: 02/19/1995    Years since quitting: 29.8   Smokeless tobacco: Never  Vaping Use   Vaping status: Never Used  Substance and Sexual Activity   Alcohol use: Yes    Comment: occasional   Drug use: Never   Sexual activity: Not on file  Other Topics Concern   Not on file  Social History Narrative   Not on file   Social Drivers of Health   Tobacco Use: Medium Risk (12/09/2024)   Patient History    Smoking Tobacco Use: Former    Smokeless Tobacco Use: Never    Passive Exposure: Not on file  Financial Resource Strain: Low Risk (01/18/2023)   Received from [facility]   Overall Financial Resource Strain (CARDIA)    Difficulty of Paying Living Expenses: Not hard at all  Food Insecurity: No Food Insecurity (01/18/2023)   Received from [facility]   Epic    Within the past  12 months, you worried that your food would run out before you got the money to buy more.: Never true    Within the past 12 months, the food you bought just didn't last and you didn't have money to get more.: Never true  Transportation Needs: No Transportation Needs (01/18/2023)   Received from [facility] - Transportation    Lack of Transportation (Medical): No    Lack of Transportation (Non-Medical): No  Physical Activity: Sufficiently Active (01/18/2023)   Received from [facility]   Exercise Vital Sign    On average, how many days per week do you engage in moderate to strenuous exercise (like a brisk walk)?: 3 days    On average, how many minutes do you engage in exercise at this level?: 50 min  Stress: No Stress Concern Present (01/18/2023)   Received from [facility] of Occupational Health - Occupational Stress Questionnaire    Feeling of Stress : Only a little  Social Connections: Socially Integrated (01/18/2023)   Received from [facility]   Social Network    How would you rate your social network  (family, work, friends)?: Good participation with social networks  Depression (PHQ2-9): Low Risk (11/19/2024)   Depression (PHQ2-9)    PHQ-2 Score: 3  Recent Concern: Depression (PHQ2-9) - Medium Risk (09/23/2024)   Depression (PHQ2-9)    PHQ-2 Score: 10  Alcohol Screen: Not on file  Housing: Unknown (09/12/2024)   Received from [facility]   Epic    Unable to Pay for Housing in the Last Year: Not on file    Number of Times Moved in the Last Year: Not on file    At any time in the past 12 months, were you homeless or living in a shelter (including now)?: No  Utilities: Not At Risk (01/18/2023)   Received from [facility] Utilities    Threatened with loss of utilities: No  Health Literacy: Not on file     Review of Systems: A 12 point ROS discussed and pertinent positives are indicated in the HPI above.  All other systems are negative.  Review of Systems  Constitutional:  Negative for fatigue and fever.  Respiratory:  Negative for cough and shortness of breath.   Cardiovascular:  Negative for chest pain.  Gastrointestinal:  Negative for abdominal pain, nausea and vomiting.  Psychiatric/Behavioral:  Negative for behavioral problems and confusion.        Anxious    Vital Signs: BP (!) 153/108   Pulse 89   Temp 98.8 F (37.1 C) (Oral)   Resp 17   Ht 5' 7 (1.702 m)   Wt 175 lb (79.4 kg)   SpO2 97%   BMI 27.41 kg/m   Physical Exam Vitals and nursing note reviewed.  Constitutional:      Appearance: Normal appearance.  HENT:     Mouth/Throat:     Mouth: Mucous membranes are moist.     Pharynx: Oropharynx is clear.  Cardiovascular:     Rate and Rhythm: Normal rate.  Pulmonary:     Effort: Pulmonary effort is normal.     Breath sounds: Normal breath sounds.  Skin:    General: Skin is warm and dry.  Neurological:     General: No focal deficit present.     Mental Status: She is alert and oriented to person, place, and time. Mental status is at  baseline.  Psychiatric:  Mood and Affect: Mood normal.        Behavior: Behavior normal.        Thought Content: Thought content normal.        Judgment: Judgment normal.      MD Evaluation Airway: WNL Heart: WNL Abdomen: WNL Chest/ Lungs: WNL ASA  Classification: 3 Mallampati/Airway Score: Two   Imaging: No results found.  Labs:  CBC: Recent Labs    09/16/24 1146 12/09/24 0701  WBC 4.7 4.7  HGB 13.9 12.6  HCT 39.4 37.4  PLT 262 364    COAGS: Recent Labs    12/09/24 0701  INR 0.9    BMP: Recent Labs    09/16/24 1146  NA 136  K 4.0  CL 104  CO2 26  GLUCOSE 96  BUN 11  CALCIUM  10.0  CREATININE 0.87  GFRNONAA >60    LIVER FUNCTION TESTS: Recent Labs    09/16/24 1146  BILITOT 0.4  AST 17  ALT 14  ALKPHOS 54  PROT 7.6  ALBUMIN  4.7    TUMOR MARKERS: No results for input(s): AFPTM, CEA, CA199, CHROMGRNA in the last 8760 hours.  Assessment and Plan: Breast cancer, right renal mass Patient with past medical history of breast cancer presents with complaint of new right renal mass.  IR consulted for biopsy at the request of Dr. Renda. Case reviewed by Dr. Jenna who approves patient for procedure.  Patient presents today in their usual state of health.  She has been NPO and is not currently on blood thinners.   Her BP is elevated today, however given her level of anxiety with lower extremity BP measurements, this will likely respond to sedation/anti-anxiolytic.  She does not take BP meds at home and reports normal BP for her other recent appointments.  Reviewed with Dr. Jenna who is in agreement.   Risks and benefits of biopsy was discussed with the patient and/or patient's family including, but not limited to bleeding, infection, damage to adjacent structures or low yield requiring additional tests.  All of the questions were answered and there is agreement to proceed.  Consent signed and in chart.   Thank you for this  interesting consult.  I greatly enjoyed meeting [facility] and look forward to participating in their care.  A copy of this report was sent to the requesting provider on this date.  Electronically Signed: Karis Emig Sue-Ellen Jalissa Heinzelman, PA 12/09/2024, 8:30 AM   I spent a total of  40 Minutes   in face to face in clinical consultation, greater than 50% of which was counseling/coordinating care for right renal mass biopsy.  "

## 2024-12-09 NOTE — Discharge Instructions (Addendum)
 SABRA

## 2024-12-09 NOTE — Procedures (Signed)
 Pre procedural Dx: renal mass  Post procedural Dx: Same  Technically successful CT guided biopsy of right renal mass   EBL: None.   Complications: None immediate.   KANDICE Banner, MD Pager #: [phone]

## 2024-12-10 ENCOUNTER — Encounter: Payer: Self-pay | Admitting: *Deleted

## 2024-12-10 LAB — SURGICAL PATHOLOGY

## 2024-12-11 ENCOUNTER — Encounter: Payer: Self-pay | Admitting: *Deleted

## 2024-12-11 ENCOUNTER — Telehealth: Payer: Self-pay

## 2024-12-11 NOTE — Progress Notes (Signed)
 Date of any COVID positive Test in last 90 days:  PCP - Tully Theophilus Andrews, MD Cardiologist -  Oncology- Mackey Chad, MD   Chest x-ray - 09-27-2022  2v  EKG -  09-27-2022 Stress Test -  ECHO -05-20-2015    Cardiac Cath -  CT Coronary Calcium  score:  ZIO monitor-   Pacemaker / ICD device [x]  No []  Yes   Spinal Cord Stimulator:[x]  No []  Yes       History of Sleep Apnea? [x]  No []  Yes   CPAP used?- [x]  No []  Yes    Medication on DOS: Gabapentin  (NEURONTIN ), Tylenol  OR oxycodone  (ROXICODONE )   Patient has: [x]  NO Hx DM   []  Pre-DM   []  DM1  []   DM2 Does the patient monitor blood sugar?   [x]  N/A   []  No []  Yes   Blood Thinner / Instructions:  none Aspirin Instructions:  none  Activity level: Able to walk up 2 flights of stairs without becoming significantly short of breath or having chest pain?   []    Yes   []  No,  would have:  Patient can perform ADLs without assistance.  []   Yes    []  No   Comments: s/p bilateral mastectomies w/ immediate reconstruction, sentinel node bx 11-21-2024   Anesthesia review:  GERD, Migraines,   Patient denies any S&S of respiratory illness or Covid - no shortness of breath, fever, cough or chest pain at PAT appointment.  Patient verbalized understanding and agreement to the Pre-Surgical Instructions that were given to them at this PAT appointment. Patient was also educated of the need to review these PAT instructions again prior to her surgery.I reviewed the appropriate phone numbers to call if they have any and questions or concerns.

## 2024-12-11 NOTE — Telephone Encounter (Signed)
 Dr. Montorfano called patient and left message on VM

## 2024-12-11 NOTE — Patient Instructions (Signed)
 SURGICAL WAITING ROOM VISITATION Patients having surgery or a procedure may have no more than 2 support people in the waiting area - these visitors may rotate in the visitor waiting room.   If the patient needs to stay at the hospital during part of their recovery, the visitor guidelines for inpatient rooms apply.  PRE-OP VISITATION  Pre-op nurse will coordinate an appropriate time for 1 support person to accompany the patient in pre-op.  This support person may not rotate.  This visitor will be contacted when the time is appropriate for the visitor to come back in the pre-op area.  To keep our patients, visitors and teammates safe and prevent the spread of respiratory illnesses over the next few months.  Temporary Visitor Restrictions  Children ages 67 and under will not be able to visit patients in [facility] under most circumstances. Visitation is not restricted outside of hospitals unless noted otherwise in the [facility] and Location Specific Visitation Guidelines at:       [email or web address]  Visitors with respiratory illnesses are discouraged from visiting and should remain at home. You are not required to quarantine at this time prior to your surgery. However, you must do this: Hand Hygiene often Do NOT share personal items Notify your provider if you are in close contact with someone who has COVID or you develop fever 100.4 or greater, new onset of sneezing, cough, sore throat, shortness of breath or body aches.  If you test positive for Covid or have been in contact with anyone that has tested positive in the last 10 days please notify you surgeon.    Your procedure is scheduled on:  THURSDAY  12-19-2024  Report to [facility] Main Entrance: [address] entrance where the [employer] is available.   Report to admitting at:  09:00   AM  Call this number if you have any questions or problems the morning of surgery [phone]  DO NOT EAT OR DRINK ANYTHING  AFTER MIDNIGHT THE NIGHT PRIOR TO YOUR SURGERY / PROCEDURE.   FOLLOW  ANY ADDITIONAL PRE OP INSTRUCTIONS YOU RECEIVED FROM YOUR SURGEON'S OFFICE!!!   Oral Hygiene is also important to reduce your risk of infection.        Remember - BRUSH YOUR TEETH THE MORNING OF SURGERY WITH YOUR REGULAR TOOTHPASTE  Do NOT smoke after Midnight the night before surgery.  STOP TAKING all Vitamins, Herbs and supplements 1 week before your surgery.   Take ONLY these medicines the morning of surgery with A SIP OF WATER: Gabapentin  (NEURONTIN ), and you may take EITHER Tylenol  OR oxycodone  (ROXICODONE ) depending on pain level.                   You may not have any metal on your body including hair pins, jewelry, and body piercing  Do not wear make-up, lotions, powders, perfumes or deodorant  Do not wear nail polish including gel and S&S, artificial / acrylic nails, or any other type of covering on natural nails including finger and toenails. If you have artificial nails, gel coating, etc., that needs to be removed by a nail salon, Please have this removed prior to surgery. Not doing so may mean that your surgery could be cancelled or delayed if the Surgeon or anesthesia staff feels like they are unable to monitor you safely.   Do not shave 48 hours prior to surgery to avoid nicks in your skin which may contribute to postoperative infections.   Contacts, Hearing Aids,  dentures or bridgework may not be worn into surgery. DENTURES WILL BE REMOVED PRIOR TO SURGERY PLEASE DO NOT APPLY Poly grip OR ADHESIVES!!!  You may bring a small overnight bag with you on the day of surgery, only pack items that are not valuable. [facility] IS NOT RESPONSIBLE   FOR VALUABLES THAT ARE LOST OR STOLEN.   Do not bring your home medications to the hospital. The Pharmacy will dispense medications listed on your medication list to you during your admission in the Hospital.  Special Instructions: Bring a copy of your healthcare power  of attorney and living will documents the day of surgery, if you wish to have them scanned into your [facility] Records- EPIC  Please read over the following fact sheets you were given: IF YOU HAVE QUESTIONS ABOUT YOUR PRE-OP INSTRUCTIONS, PLEASE CALL [phone].    - Preparing for Surgery         Before surgery, you can play an important role.  Because skin is not sterile, your skin needs to be as free of germs as possible.  You can reduce the number of germs on your skin by washing with CHG (chlorahexidine gluconate) soap before surgery.  CHG is an antiseptic cleaner which kills germs and bonds with the skin to continue killing germs even after washing. Please DO NOT use if you have an allergy to CHG or antibacterial soaps.  If your skin becomes reddened/irritated stop using the CHG and inform your nurse when you arrive at Short Stay. Do not shave (including legs and underarms) for at least 48 hours prior to the first CHG shower.  You may shave your face/neck.  Please follow these instructions carefully:  1.  Shower with CHG Soap the night before surgery ONLY (DO NOT USE THE CHG SOAP THE MORNING OF SURGERY).  2.  If you choose to wash your hair, wash your hair first as usual with your normal  shampoo.  3.  After you shampoo, rinse your hair and body thoroughly to remove the shampoo.                             4.  Use CHG as you would any other liquid soap.  You can apply chg directly to the skin and wash.  Gently with a scrungie or clean washcloth.  5.  Apply the CHG Soap to your body ONLY FROM THE NECK DOWN.   Do not use on face/ open                           Wound or open sores. Avoid contact with eyes, ears mouth and genitals (private parts).                       Wash face,  Genitals (private parts) with your normal soap.             6.  Wash thoroughly, paying special attention to the area where your  surgery  will be performed.  7.  Thoroughly rinse your body with  warm water from the neck down.  8.  DO NOT shower/wash with your normal soap after using and rinsing off the CHG Soap.                9.  Pat yourself dry with a clean towel.  10.  Wear clean pajamas.            11.  Place clean sheets on your bed the night of your first shower and do not  sleep with pets.  Day of Surgery : Do not apply any CHG, lotions/deodorants the morning of surgery.  Please wear clean clothes to the hospital/surgery center.   FAILURE TO FOLLOW THESE INSTRUCTIONS MAY RESULT IN THE CANCELLATION OF YOUR SURGERY  PATIENT SIGNATURE_________________________________  NURSE SIGNATURE__________________________________  ________________________________________________________________________

## 2024-12-11 NOTE — Progress Notes (Signed)
 Patient aware waiting on HER2 by FISH to be resulted. Results should be back tomorrow so her f/u with Dr. Odean has been moved from [place] to Sat am at 0945. Patient verbalizes understanding.

## 2024-12-12 ENCOUNTER — Inpatient Hospital Stay: Admitting: Hematology and Oncology

## 2024-12-12 ENCOUNTER — Other Ambulatory Visit: Payer: Self-pay

## 2024-12-12 ENCOUNTER — Encounter (HOSPITAL_COMMUNITY)
Admission: RE | Admit: 2024-12-12 | Discharge: 2024-12-12 | Disposition: A | Source: Ambulatory Visit | Attending: Urology

## 2024-12-12 ENCOUNTER — Encounter (HOSPITAL_COMMUNITY): Payer: Self-pay

## 2024-12-12 HISTORY — DX: Chronic kidney disease, unspecified: N18.9

## 2024-12-12 LAB — TYPE AND SCREEN
ABO/RH(D): O POS
Antibody Screen: NEGATIVE

## 2024-12-12 NOTE — Assessment & Plan Note (Signed)
 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   Pathology: Lumpectomy at [facility] on 01/29/2015: residual microscopic invasive ductal carcinoma spanning 3.2 cm T2 N0 M0 Pathological stage II a   Treatment summary: Neoadjuvant chemotherapy with Taxotere , carboplatin , Herceptin  and Perjeta  given once every 3 weeks from 09/19/2014 to 01/02/2015 .Adjuvant radiation therapy completed June 2016; Herceptin  every 3 weeks completed 12/17/2015 started tamoxifen  06/05/2015. 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, intermediate grade, ER 100%, PR 10% Retroareolar left breast: 1 cm mass, biopsy: Grade 3 IDC, no LVI, ER 70%, PR 15%, Ki67 10%, HER2 2+ by IHC, FISH positive   CT CAP 09/23/2024: No evidence of metastatic disease, right lower pole renal mass 3.4 x 3 cm Bone scan 09/23/2024: Benign   Recommendation: Bilateral mastectomies with reconstruction 11/21/2024: Right mastectomy: Intermediate grade DCIS 0.5 cm, margins negative, 0/2 lymph nodes negative; left mastectomy: Grade 3 IDC with lobular features 1.3 cm with intermediate grade DCIS, margins negative, ER/PR positive, HER2 IHC 2+ FISH pending Since the final pathology is ER/PR and HER2 positive, I discussed with her about Taxol Herceptin  versus Kadcyla. 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

## 2024-12-13 ENCOUNTER — Ambulatory Visit: Admitting: Student

## 2024-12-13 DIAGNOSIS — C50412 Malignant neoplasm of upper-outer quadrant of left female breast: Secondary | ICD-10-CM

## 2024-12-13 DIAGNOSIS — Z170 Estrogen receptor positive status [ER+]: Secondary | ICD-10-CM

## 2024-12-13 LAB — SURGICAL PATHOLOGY

## 2024-12-13 MED ORDER — SULFAMETHOXAZOLE-TRIMETHOPRIM 800-160 MG PO TABS: 1.0000 | ORAL_TABLET | Freq: Two times a day (BID) | ORAL | 0 refills | Status: AC

## 2024-12-13 NOTE — Progress Notes (Cosign Needed)
 Patient is a 49 year old female who recently underwent right skin-sparing mastectomy, right deep axillary sentinel lymph node biopsy, left skin sparing mastectomy and left deep axillary sentinel lymph node biopsy with Dr. Ebbie followed by bilateral breast reconstruction with insertion of 350 cc tissue expanders with Dr. Montorfano on 11/21/2024.  Patient is about 3 weeks postop.  She presents to the clinic today for postoperative follow-up.     Patient was last seen in the clinic on 12/06/2024.  At this visit, patient was overall doing well.  Her drain output on the left side had been minimal.  Her right drain output was slightly elevated.  On exam, expanders were in place bilaterally and were soft.  There is no overlying erythema, no obvious fluid collection on exam.  There was some mild ecchymosis noted bilaterally.  Steri-Strips were in place over the incisions.  JP drains were in place and functioning bilaterally with minimal serosanguineous drainage in each of the bulbs.  The left JP drain was removed without any difficulty.  50 cc of injectable saline was placed into each expander for a total of 150-350 cc.  Of note, patient did have a renal biopsy earlier this week.  Per Dr. Montorfano, patient told him that she had to lay on her left side for about 45 minutes for the biopsy.  Today, patient presents with her husband at bedside.  She reports that overall she is doing well.  She states that she has had some color change to her left lateral breast that started a few days ago.  She states that it is about the same, maybe slightly better.  She reports that overall her left breast has been more tender, but denies any increasing pain or tenderness.  She denies any fevers or chills.  She reports that her drain output from the right side has been minimal, ranging from 8 cc to about 15 cc/day for the past few days.  Chaperone present on exam.  On exam, patient is sitting upright in no acute distress.   Expanders are in place bilaterally.  To the left side, there is some mild overlying erythema out laterally.  It is nontender to palpation.  It does not appear to be cellulitic.  Skin looks mildly congested.  There are no obvious fluid collections on exam.  There is no overlying erythema to either breast elsewhere.  To the left superior breast, the skin does appear to be starting to scar down.  Steri-Strips are intact over the incision.  Left drain site appears to be well-healed.  Right JP drain is in place with minimal serous drainage in the bulb.  Right drain was removed without any difficulty.  Drain site was cleaned with Vashe and a Mepilex border dressing was placed over the drain site.  Patient tolerated well.  I discussed with the patient that the change in color to her left breast appears to be the a little bit of congested skin.  Given this, discussed that we would hold off on expander fill today.  She expressed understanding.  Discussed with her to monitor this area very closely.  I discussed with her that I will send her in antibiotics in case she feels that it gets worse.  Discussed with her to call us  before taking the antibiotics if she feels like she has to take them.  I discussed with her that if she feels it is getting more red, becoming larger, becomes painful, if she develops any fevers or chills, she needs to  notify us  immediately.  I discussed with her that if she feels she needs to take the antibiotic she should let us  know.  Patient expressed understanding.  Discussed with the patient to wait at least 48 hours before showering.  Discussed with her she can apply Vaseline, gauze and tape over her drain site daily.  She expressed understanding.  Recommended that patient gently massage the skin to her left breast where it is starting to scar down.  Discussed with her she can do some very gentle range of motion with her left upper extremity.  Will plan on having her start physical therapy  when she is 6 weeks postop.  Patient expressed understanding.  Discussed with the patient to continue with compression, but to not have too tight of compression.  Discussed with her to avoid strenuous activities.  She expressed understanding.  I would like the patient to follow back up next week.  I discussed with her that I would like her to have a very low threshold for calling us .  Discussed with her to call with any questions or concerns.  Pictures were obtained of the patient and placed in the chart with the patient's or guardian's permission.   Discussed today's visit and plan with Dr. Montorfano and he is agreement with the plan.

## 2024-12-14 ENCOUNTER — Inpatient Hospital Stay: Attending: Hematology and Oncology | Admitting: Hematology and Oncology

## 2024-12-18 ENCOUNTER — Encounter

## 2024-12-19 ENCOUNTER — Inpatient Hospital Stay: Admitting: Hematology and Oncology

## 2024-12-19 ENCOUNTER — Ambulatory Visit (HOSPITAL_COMMUNITY): Admission: RE | Admit: 2024-12-19 | Source: Home / Self Care | Admitting: Urology

## 2024-12-19 ENCOUNTER — Encounter (HOSPITAL_COMMUNITY): Admission: RE | Payer: Self-pay | Source: Home / Self Care

## 2024-12-30 ENCOUNTER — Inpatient Hospital Stay: Admitting: Hematology and Oncology

## 2025-03-26 ENCOUNTER — Ambulatory Visit
